# Patient Record
Sex: Female | Born: 1940 | Race: White | Hispanic: No | Smoking: Never smoker
Health system: Southern US, Community
[De-identification: ages and names within clinical notes are randomized; demographics above are authoritative.]

## PROBLEM LIST (undated history)

## (undated) DIAGNOSIS — I82409 Acute embolism and thrombosis of unspecified deep veins of unspecified lower extremity: Secondary | ICD-10-CM

## (undated) DIAGNOSIS — E785 Hyperlipidemia, unspecified: Secondary | ICD-10-CM

## (undated) DIAGNOSIS — J189 Pneumonia, unspecified organism: Secondary | ICD-10-CM

## (undated) DIAGNOSIS — Z9981 Dependence on supplemental oxygen: Secondary | ICD-10-CM

## (undated) DIAGNOSIS — K449 Diaphragmatic hernia without obstruction or gangrene: Secondary | ICD-10-CM

## (undated) DIAGNOSIS — Z9889 Other specified postprocedural states: Secondary | ICD-10-CM

## (undated) DIAGNOSIS — R059 Cough, unspecified: Secondary | ICD-10-CM

## (undated) DIAGNOSIS — R55 Syncope and collapse: Secondary | ICD-10-CM

## (undated) DIAGNOSIS — I2510 Atherosclerotic heart disease of native coronary artery without angina pectoris: Secondary | ICD-10-CM

## (undated) DIAGNOSIS — F0390 Unspecified dementia without behavioral disturbance: Secondary | ICD-10-CM

## (undated) DIAGNOSIS — R519 Headache, unspecified: Secondary | ICD-10-CM

## (undated) DIAGNOSIS — R05 Cough: Secondary | ICD-10-CM

## (undated) DIAGNOSIS — R413 Other amnesia: Secondary | ICD-10-CM

## (undated) DIAGNOSIS — R51 Headache: Secondary | ICD-10-CM

## (undated) DIAGNOSIS — J45909 Unspecified asthma, uncomplicated: Secondary | ICD-10-CM

## (undated) DIAGNOSIS — Z8719 Personal history of other diseases of the digestive system: Secondary | ICD-10-CM

## (undated) DIAGNOSIS — R112 Nausea with vomiting, unspecified: Secondary | ICD-10-CM

## (undated) DIAGNOSIS — H8110 Benign paroxysmal vertigo, unspecified ear: Principal | ICD-10-CM

## (undated) DIAGNOSIS — Z7901 Long term (current) use of anticoagulants: Secondary | ICD-10-CM

## (undated) DIAGNOSIS — D126 Benign neoplasm of colon, unspecified: Secondary | ICD-10-CM

## (undated) DIAGNOSIS — IMO0001 Reserved for inherently not codable concepts without codable children: Secondary | ICD-10-CM

## (undated) DIAGNOSIS — I2699 Other pulmonary embolism without acute cor pulmonale: Secondary | ICD-10-CM

## (undated) DIAGNOSIS — F419 Anxiety disorder, unspecified: Secondary | ICD-10-CM

## (undated) DIAGNOSIS — K5730 Diverticulosis of large intestine without perforation or abscess without bleeding: Secondary | ICD-10-CM

## (undated) DIAGNOSIS — R918 Other nonspecific abnormal finding of lung field: Secondary | ICD-10-CM

## (undated) DIAGNOSIS — J449 Chronic obstructive pulmonary disease, unspecified: Secondary | ICD-10-CM

## (undated) DIAGNOSIS — F32A Depression, unspecified: Secondary | ICD-10-CM

## (undated) DIAGNOSIS — I6529 Occlusion and stenosis of unspecified carotid artery: Secondary | ICD-10-CM

## (undated) DIAGNOSIS — D0359 Melanoma in situ of other part of trunk: Secondary | ICD-10-CM

## (undated) DIAGNOSIS — M1990 Unspecified osteoarthritis, unspecified site: Secondary | ICD-10-CM

## (undated) DIAGNOSIS — R296 Repeated falls: Secondary | ICD-10-CM

## (undated) DIAGNOSIS — K219 Gastro-esophageal reflux disease without esophagitis: Secondary | ICD-10-CM

## (undated) DIAGNOSIS — F329 Major depressive disorder, single episode, unspecified: Secondary | ICD-10-CM

## (undated) HISTORY — DX: Unspecified dementia, unspecified severity, without behavioral disturbance, psychotic disturbance, mood disturbance, and anxiety: F03.90

## (undated) HISTORY — DX: Other amnesia: R41.3

## (undated) HISTORY — DX: Dependence on supplemental oxygen: Z99.81

## (undated) HISTORY — DX: Atherosclerotic heart disease of native coronary artery without angina pectoris: I25.10

## (undated) HISTORY — PX: BREAST CYST EXCISION: SHX579

## (undated) HISTORY — DX: Diaphragmatic hernia without obstruction or gangrene: K44.9

## (undated) HISTORY — DX: Unspecified asthma, uncomplicated: J45.909

## (undated) HISTORY — PX: APPENDECTOMY: SHX54

## (undated) HISTORY — DX: Benign neoplasm of colon, unspecified: D12.6

## (undated) HISTORY — DX: Syncope and collapse: R55

## (undated) HISTORY — DX: Gastro-esophageal reflux disease without esophagitis: K21.9

## (undated) HISTORY — PX: CARPAL TUNNEL RELEASE: SHX101

## (undated) HISTORY — DX: Cough: R05

## (undated) HISTORY — DX: Diverticulosis of large intestine without perforation or abscess without bleeding: K57.30

## (undated) HISTORY — DX: Hyperlipidemia, unspecified: E78.5

## (undated) HISTORY — PX: MELANOMA EXCISION: SHX5266

## (undated) HISTORY — DX: Major depressive disorder, single episode, unspecified: F32.9

## (undated) HISTORY — DX: Cough, unspecified: R05.9

## (undated) HISTORY — DX: Other specified postprocedural states: Z98.890

## (undated) HISTORY — PX: ANKLE RECONSTRUCTION: SHX1151

## (undated) HISTORY — DX: Occlusion and stenosis of unspecified carotid artery: I65.29

## (undated) HISTORY — PX: TUBAL LIGATION: SHX77

## (undated) HISTORY — DX: Depression, unspecified: F32.A

## (undated) HISTORY — DX: Benign paroxysmal vertigo, unspecified ear: H81.10

## (undated) HISTORY — DX: Pneumonia, unspecified organism: J18.9

## (undated) HISTORY — DX: Other nonspecific abnormal finding of lung field: R91.8

## (undated) HISTORY — DX: Anxiety disorder, unspecified: F41.9

## (undated) HISTORY — DX: Unspecified osteoarthritis, unspecified site: M19.90

## (undated) HISTORY — DX: Personal history of other diseases of the digestive system: Z87.19

---

## 1981-06-20 DIAGNOSIS — Z9889 Other specified postprocedural states: Secondary | ICD-10-CM

## 1981-06-20 DIAGNOSIS — R112 Nausea with vomiting, unspecified: Secondary | ICD-10-CM

## 1981-06-20 HISTORY — DX: Other specified postprocedural states: Z98.890

## 1981-06-20 HISTORY — DX: Nausea with vomiting, unspecified: R11.2

## 1997-11-19 ENCOUNTER — Ambulatory Visit (HOSPITAL_COMMUNITY): Admission: RE | Admit: 1997-11-19 | Discharge: 1997-11-19 | Payer: Self-pay | Admitting: Cardiovascular Disease

## 2000-09-04 ENCOUNTER — Other Ambulatory Visit: Admission: RE | Admit: 2000-09-04 | Discharge: 2000-09-04 | Payer: Self-pay | Admitting: Internal Medicine

## 2001-12-24 ENCOUNTER — Ambulatory Visit (HOSPITAL_COMMUNITY): Admission: RE | Admit: 2001-12-24 | Discharge: 2001-12-24 | Payer: Self-pay | Admitting: Gastroenterology

## 2002-01-16 ENCOUNTER — Encounter: Admission: RE | Admit: 2002-01-16 | Discharge: 2002-01-16 | Payer: Self-pay | Admitting: Family Medicine

## 2002-01-16 ENCOUNTER — Encounter: Payer: Self-pay | Admitting: Family Medicine

## 2002-04-14 ENCOUNTER — Emergency Department (HOSPITAL_COMMUNITY): Admission: EM | Admit: 2002-04-14 | Discharge: 2002-04-14 | Payer: Self-pay | Admitting: Emergency Medicine

## 2002-04-14 ENCOUNTER — Encounter: Payer: Self-pay | Admitting: Emergency Medicine

## 2003-04-07 ENCOUNTER — Encounter: Admission: RE | Admit: 2003-04-07 | Discharge: 2003-04-07 | Payer: Self-pay | Admitting: Family Medicine

## 2003-04-07 ENCOUNTER — Encounter: Payer: Self-pay | Admitting: Family Medicine

## 2003-04-27 ENCOUNTER — Emergency Department (HOSPITAL_COMMUNITY): Admission: EM | Admit: 2003-04-27 | Discharge: 2003-04-28 | Payer: Self-pay | Admitting: Emergency Medicine

## 2003-05-08 ENCOUNTER — Ambulatory Visit (HOSPITAL_COMMUNITY): Admission: RE | Admit: 2003-05-08 | Discharge: 2003-05-08 | Payer: Self-pay | Admitting: Internal Medicine

## 2003-05-20 ENCOUNTER — Inpatient Hospital Stay (HOSPITAL_COMMUNITY): Admission: AD | Admit: 2003-05-20 | Discharge: 2003-05-26 | Payer: Self-pay | Admitting: Internal Medicine

## 2003-05-23 ENCOUNTER — Encounter (INDEPENDENT_AMBULATORY_CARE_PROVIDER_SITE_OTHER): Payer: Self-pay | Admitting: *Deleted

## 2003-12-16 ENCOUNTER — Encounter: Payer: Self-pay | Admitting: Internal Medicine

## 2003-12-18 ENCOUNTER — Ambulatory Visit: Admission: RE | Admit: 2003-12-18 | Discharge: 2003-12-18 | Payer: Self-pay | Admitting: Orthopedic Surgery

## 2004-10-06 ENCOUNTER — Other Ambulatory Visit: Admission: RE | Admit: 2004-10-06 | Discharge: 2004-10-06 | Payer: Self-pay | Admitting: Family Medicine

## 2006-03-21 ENCOUNTER — Other Ambulatory Visit: Admission: RE | Admit: 2006-03-21 | Discharge: 2006-03-21 | Payer: Self-pay | Admitting: Family Medicine

## 2006-06-23 ENCOUNTER — Encounter: Admission: RE | Admit: 2006-06-23 | Discharge: 2006-06-23 | Payer: Self-pay | Admitting: Family Medicine

## 2006-07-18 ENCOUNTER — Encounter: Admission: RE | Admit: 2006-07-18 | Discharge: 2006-07-18 | Payer: Self-pay | Admitting: Family Medicine

## 2006-10-27 ENCOUNTER — Encounter: Admission: RE | Admit: 2006-10-27 | Discharge: 2006-10-27 | Payer: Self-pay | Admitting: Family Medicine

## 2006-10-27 ENCOUNTER — Encounter (INDEPENDENT_AMBULATORY_CARE_PROVIDER_SITE_OTHER): Payer: Self-pay | Admitting: *Deleted

## 2006-11-03 ENCOUNTER — Encounter: Admission: RE | Admit: 2006-11-03 | Discharge: 2006-11-03 | Payer: Self-pay | Admitting: Family Medicine

## 2006-11-14 ENCOUNTER — Ambulatory Visit (HOSPITAL_COMMUNITY): Admission: RE | Admit: 2006-11-14 | Discharge: 2006-11-14 | Payer: Self-pay | Admitting: Family Medicine

## 2007-05-04 ENCOUNTER — Encounter: Admission: RE | Admit: 2007-05-04 | Discharge: 2007-05-04 | Payer: Self-pay | Admitting: Family Medicine

## 2007-05-23 ENCOUNTER — Encounter: Admission: RE | Admit: 2007-05-23 | Discharge: 2007-05-23 | Payer: Self-pay | Admitting: Orthopedic Surgery

## 2007-08-02 ENCOUNTER — Ambulatory Visit: Payer: Self-pay | Admitting: Internal Medicine

## 2007-08-02 DIAGNOSIS — R0609 Other forms of dyspnea: Secondary | ICD-10-CM | POA: Insufficient documentation

## 2007-08-02 DIAGNOSIS — R05 Cough: Secondary | ICD-10-CM

## 2007-08-02 DIAGNOSIS — R0989 Other specified symptoms and signs involving the circulatory and respiratory systems: Secondary | ICD-10-CM

## 2007-08-03 LAB — CONVERTED CEMR LAB
BUN: 14 mg/dL (ref 6–23)
Basophils Absolute: 0 10*3/uL (ref 0.0–0.1)
CO2: 33 meq/L — ABNORMAL HIGH (ref 19–32)
Calcium: 9.6 mg/dL (ref 8.4–10.5)
Creatinine, Ser: 0.7 mg/dL (ref 0.4–1.2)
GFR calc Af Amer: 108 mL/min
Glucose, Bld: 100 mg/dL — ABNORMAL HIGH (ref 70–99)
HCT: 41 % (ref 36.0–46.0)
Hemoglobin: 13.6 g/dL (ref 12.0–15.0)
Lymphocytes Relative: 43.9 % (ref 12.0–46.0)
Neutro Abs: 3.5 10*3/uL (ref 1.4–7.7)
Neutrophils Relative %: 43.8 % (ref 43.0–77.0)
Potassium: 4.3 meq/L (ref 3.5–5.1)
Pro B Natriuretic peptide (BNP): 36 pg/mL (ref 0.0–100.0)
RDW: 12.3 % (ref 11.5–14.6)

## 2007-09-27 ENCOUNTER — Encounter: Admission: RE | Admit: 2007-09-27 | Discharge: 2007-09-27 | Payer: Self-pay | Admitting: Family Medicine

## 2007-10-03 ENCOUNTER — Ambulatory Visit: Payer: Self-pay | Admitting: Internal Medicine

## 2007-10-19 ENCOUNTER — Ambulatory Visit: Payer: Self-pay | Admitting: Internal Medicine

## 2007-11-02 ENCOUNTER — Ambulatory Visit: Payer: Self-pay | Admitting: Internal Medicine

## 2007-11-20 ENCOUNTER — Ambulatory Visit: Payer: Self-pay | Admitting: Internal Medicine

## 2007-12-11 ENCOUNTER — Telehealth (INDEPENDENT_AMBULATORY_CARE_PROVIDER_SITE_OTHER): Payer: Self-pay | Admitting: *Deleted

## 2007-12-11 ENCOUNTER — Encounter: Payer: Self-pay | Admitting: Internal Medicine

## 2007-12-12 ENCOUNTER — Telehealth (INDEPENDENT_AMBULATORY_CARE_PROVIDER_SITE_OTHER): Payer: Self-pay | Admitting: *Deleted

## 2008-01-01 ENCOUNTER — Ambulatory Visit: Payer: Self-pay | Admitting: Internal Medicine

## 2008-01-01 DIAGNOSIS — J45909 Unspecified asthma, uncomplicated: Secondary | ICD-10-CM | POA: Insufficient documentation

## 2008-01-29 ENCOUNTER — Ambulatory Visit: Payer: Self-pay | Admitting: Internal Medicine

## 2008-03-11 ENCOUNTER — Ambulatory Visit: Payer: Self-pay | Admitting: Internal Medicine

## 2008-04-30 ENCOUNTER — Ambulatory Visit: Payer: Self-pay | Admitting: Internal Medicine

## 2008-04-30 DIAGNOSIS — J449 Chronic obstructive pulmonary disease, unspecified: Secondary | ICD-10-CM

## 2008-04-30 DIAGNOSIS — K219 Gastro-esophageal reflux disease without esophagitis: Secondary | ICD-10-CM

## 2008-04-30 DIAGNOSIS — J4489 Other specified chronic obstructive pulmonary disease: Secondary | ICD-10-CM | POA: Insufficient documentation

## 2008-05-22 ENCOUNTER — Ambulatory Visit: Payer: Self-pay | Admitting: Internal Medicine

## 2008-05-26 ENCOUNTER — Ambulatory Visit: Payer: Self-pay | Admitting: Gastroenterology

## 2008-05-26 DIAGNOSIS — R1310 Dysphagia, unspecified: Secondary | ICD-10-CM | POA: Insufficient documentation

## 2008-06-11 ENCOUNTER — Ambulatory Visit: Payer: Self-pay | Admitting: Gastroenterology

## 2008-06-11 DIAGNOSIS — K449 Diaphragmatic hernia without obstruction or gangrene: Secondary | ICD-10-CM

## 2008-06-11 HISTORY — DX: Diaphragmatic hernia without obstruction or gangrene: K44.9

## 2008-06-11 HISTORY — PX: ESOPHAGOGASTRODUODENOSCOPY: SHX1529

## 2008-07-14 ENCOUNTER — Encounter: Payer: Self-pay | Admitting: Gastroenterology

## 2008-07-14 ENCOUNTER — Telehealth: Payer: Self-pay | Admitting: Gastroenterology

## 2008-07-15 ENCOUNTER — Encounter: Payer: Self-pay | Admitting: Gastroenterology

## 2008-08-01 DIAGNOSIS — K449 Diaphragmatic hernia without obstruction or gangrene: Secondary | ICD-10-CM | POA: Insufficient documentation

## 2008-08-06 ENCOUNTER — Ambulatory Visit: Payer: Self-pay | Admitting: Internal Medicine

## 2008-08-06 ENCOUNTER — Telehealth: Payer: Self-pay | Admitting: Gastroenterology

## 2008-08-06 ENCOUNTER — Ambulatory Visit: Payer: Self-pay | Admitting: Gastroenterology

## 2008-08-08 ENCOUNTER — Telehealth: Payer: Self-pay | Admitting: Gastroenterology

## 2008-08-08 DIAGNOSIS — R1032 Left lower quadrant pain: Secondary | ICD-10-CM

## 2008-08-08 DIAGNOSIS — K5730 Diverticulosis of large intestine without perforation or abscess without bleeding: Secondary | ICD-10-CM | POA: Insufficient documentation

## 2008-08-08 DIAGNOSIS — R1031 Right lower quadrant pain: Secondary | ICD-10-CM

## 2008-08-11 ENCOUNTER — Ambulatory Visit: Payer: Self-pay | Admitting: Cardiology

## 2008-08-11 ENCOUNTER — Ambulatory Visit: Payer: Self-pay | Admitting: Gastroenterology

## 2008-08-11 LAB — CONVERTED CEMR LAB: Creatinine, Ser: 0.9 mg/dL (ref 0.4–1.2)

## 2008-08-12 ENCOUNTER — Telehealth: Payer: Self-pay | Admitting: Gastroenterology

## 2008-08-13 ENCOUNTER — Telehealth: Payer: Self-pay | Admitting: Gastroenterology

## 2008-08-13 ENCOUNTER — Ambulatory Visit (HOSPITAL_COMMUNITY): Admission: RE | Admit: 2008-08-13 | Discharge: 2008-08-13 | Payer: Self-pay | Admitting: Gastroenterology

## 2008-08-15 ENCOUNTER — Ambulatory Visit: Payer: Self-pay | Admitting: Gastroenterology

## 2008-08-15 DIAGNOSIS — K3184 Gastroparesis: Secondary | ICD-10-CM

## 2008-08-22 ENCOUNTER — Telehealth: Payer: Self-pay | Admitting: Gastroenterology

## 2008-08-25 ENCOUNTER — Telehealth: Payer: Self-pay | Admitting: Gastroenterology

## 2008-09-08 ENCOUNTER — Ambulatory Visit: Payer: Self-pay | Admitting: Gastroenterology

## 2008-09-09 ENCOUNTER — Ambulatory Visit: Payer: Self-pay | Admitting: Gastroenterology

## 2008-09-09 DIAGNOSIS — K5730 Diverticulosis of large intestine without perforation or abscess without bleeding: Secondary | ICD-10-CM

## 2008-09-09 HISTORY — PX: COLONOSCOPY: SHX174

## 2008-09-09 HISTORY — DX: Diverticulosis of large intestine without perforation or abscess without bleeding: K57.30

## 2008-09-17 ENCOUNTER — Telehealth: Payer: Self-pay | Admitting: Gastroenterology

## 2008-09-22 ENCOUNTER — Ambulatory Visit: Payer: Self-pay | Admitting: Internal Medicine

## 2008-11-03 ENCOUNTER — Ambulatory Visit: Payer: Self-pay | Admitting: Internal Medicine

## 2008-11-03 DIAGNOSIS — J209 Acute bronchitis, unspecified: Secondary | ICD-10-CM | POA: Insufficient documentation

## 2008-11-05 ENCOUNTER — Other Ambulatory Visit: Admission: RE | Admit: 2008-11-05 | Discharge: 2008-11-05 | Payer: Self-pay | Admitting: Family Medicine

## 2008-11-07 ENCOUNTER — Telehealth (INDEPENDENT_AMBULATORY_CARE_PROVIDER_SITE_OTHER): Payer: Self-pay | Admitting: *Deleted

## 2008-11-14 ENCOUNTER — Encounter: Admission: RE | Admit: 2008-11-14 | Discharge: 2008-11-14 | Payer: Self-pay | Admitting: Family Medicine

## 2008-12-25 ENCOUNTER — Telehealth: Payer: Self-pay | Admitting: Gastroenterology

## 2008-12-26 ENCOUNTER — Ambulatory Visit: Payer: Self-pay | Admitting: Internal Medicine

## 2009-01-09 ENCOUNTER — Telehealth: Payer: Self-pay | Admitting: Gastroenterology

## 2009-01-12 ENCOUNTER — Ambulatory Visit: Payer: Self-pay | Admitting: Internal Medicine

## 2009-01-15 ENCOUNTER — Telehealth: Payer: Self-pay | Admitting: Gastroenterology

## 2009-01-16 ENCOUNTER — Telehealth: Payer: Self-pay | Admitting: Gastroenterology

## 2009-02-18 ENCOUNTER — Ambulatory Visit: Payer: Self-pay | Admitting: Internal Medicine

## 2009-03-13 ENCOUNTER — Ambulatory Visit: Payer: Self-pay | Admitting: Internal Medicine

## 2009-03-16 ENCOUNTER — Telehealth (INDEPENDENT_AMBULATORY_CARE_PROVIDER_SITE_OTHER): Payer: Self-pay | Admitting: *Deleted

## 2009-03-17 ENCOUNTER — Ambulatory Visit: Payer: Self-pay | Admitting: Cardiology

## 2009-04-02 ENCOUNTER — Ambulatory Visit: Payer: Self-pay | Admitting: Internal Medicine

## 2009-04-02 DIAGNOSIS — J310 Chronic rhinitis: Secondary | ICD-10-CM

## 2009-04-08 ENCOUNTER — Telehealth: Payer: Self-pay | Admitting: Internal Medicine

## 2009-05-11 ENCOUNTER — Ambulatory Visit: Payer: Self-pay | Admitting: Internal Medicine

## 2009-07-03 ENCOUNTER — Telehealth: Payer: Self-pay | Admitting: Gastroenterology

## 2009-07-13 ENCOUNTER — Ambulatory Visit: Payer: Self-pay | Admitting: Internal Medicine

## 2009-08-13 ENCOUNTER — Ambulatory Visit: Payer: Self-pay | Admitting: Internal Medicine

## 2009-09-07 ENCOUNTER — Ambulatory Visit: Payer: Self-pay | Admitting: Internal Medicine

## 2009-10-19 ENCOUNTER — Ambulatory Visit: Payer: Self-pay | Admitting: Internal Medicine

## 2009-11-11 ENCOUNTER — Telehealth (INDEPENDENT_AMBULATORY_CARE_PROVIDER_SITE_OTHER): Payer: Self-pay | Admitting: *Deleted

## 2009-11-23 ENCOUNTER — Telehealth: Payer: Self-pay | Admitting: Internal Medicine

## 2009-12-01 ENCOUNTER — Telehealth (INDEPENDENT_AMBULATORY_CARE_PROVIDER_SITE_OTHER): Payer: Self-pay | Admitting: *Deleted

## 2009-12-01 ENCOUNTER — Ambulatory Visit: Payer: Self-pay | Admitting: Internal Medicine

## 2009-12-03 ENCOUNTER — Encounter: Payer: Self-pay | Admitting: Internal Medicine

## 2009-12-03 DIAGNOSIS — B370 Candidal stomatitis: Secondary | ICD-10-CM

## 2009-12-09 ENCOUNTER — Encounter: Payer: Self-pay | Admitting: Internal Medicine

## 2010-01-08 ENCOUNTER — Ambulatory Visit: Payer: Self-pay | Admitting: Internal Medicine

## 2010-01-11 ENCOUNTER — Telehealth: Payer: Self-pay | Admitting: Internal Medicine

## 2010-01-15 ENCOUNTER — Telehealth (INDEPENDENT_AMBULATORY_CARE_PROVIDER_SITE_OTHER): Payer: Self-pay | Admitting: *Deleted

## 2010-01-20 ENCOUNTER — Encounter: Payer: Self-pay | Admitting: Internal Medicine

## 2010-01-21 ENCOUNTER — Ambulatory Visit: Payer: Self-pay | Admitting: Internal Medicine

## 2010-02-08 ENCOUNTER — Telehealth (INDEPENDENT_AMBULATORY_CARE_PROVIDER_SITE_OTHER): Payer: Self-pay | Admitting: *Deleted

## 2010-02-24 ENCOUNTER — Ambulatory Visit: Payer: Self-pay | Admitting: Internal Medicine

## 2010-03-10 ENCOUNTER — Telehealth: Payer: Self-pay | Admitting: Internal Medicine

## 2010-04-05 ENCOUNTER — Ambulatory Visit: Payer: Self-pay | Admitting: Internal Medicine

## 2010-04-26 ENCOUNTER — Ambulatory Visit: Payer: Self-pay | Admitting: Internal Medicine

## 2010-04-27 ENCOUNTER — Telehealth: Payer: Self-pay | Admitting: Gastroenterology

## 2010-05-24 ENCOUNTER — Ambulatory Visit: Payer: Self-pay | Admitting: Internal Medicine

## 2010-05-24 ENCOUNTER — Encounter: Payer: Self-pay | Admitting: Adult Health

## 2010-06-15 ENCOUNTER — Encounter: Payer: Self-pay | Admitting: Internal Medicine

## 2010-06-20 HISTORY — PX: ROTATOR CUFF REPAIR: SHX139

## 2010-06-22 ENCOUNTER — Encounter: Payer: Self-pay | Admitting: Internal Medicine

## 2010-06-23 ENCOUNTER — Telehealth: Payer: Self-pay | Admitting: Internal Medicine

## 2010-06-24 ENCOUNTER — Ambulatory Visit
Admission: RE | Admit: 2010-06-24 | Discharge: 2010-06-24 | Payer: Self-pay | Source: Home / Self Care | Attending: Internal Medicine | Admitting: Internal Medicine

## 2010-07-10 ENCOUNTER — Emergency Department (HOSPITAL_COMMUNITY)
Admission: EM | Admit: 2010-07-10 | Discharge: 2010-07-10 | Payer: Self-pay | Source: Home / Self Care | Admitting: Emergency Medicine

## 2010-07-10 DIAGNOSIS — I82409 Acute embolism and thrombosis of unspecified deep veins of unspecified lower extremity: Secondary | ICD-10-CM | POA: Insufficient documentation

## 2010-07-11 ENCOUNTER — Ambulatory Visit (HOSPITAL_COMMUNITY)
Admission: RE | Admit: 2010-07-11 | Discharge: 2010-07-11 | Payer: Self-pay | Source: Home / Self Care | Attending: Emergency Medicine | Admitting: Emergency Medicine

## 2010-07-19 ENCOUNTER — Encounter (HOSPITAL_COMMUNITY)
Admission: RE | Admit: 2010-07-19 | Discharge: 2010-07-20 | Payer: Self-pay | Source: Home / Self Care | Attending: Orthopedic Surgery | Admitting: Orthopedic Surgery

## 2010-07-20 ENCOUNTER — Ambulatory Visit
Admission: RE | Admit: 2010-07-20 | Discharge: 2010-07-20 | Payer: Self-pay | Source: Home / Self Care | Attending: Vascular Surgery | Admitting: Vascular Surgery

## 2010-07-20 NOTE — Progress Notes (Signed)
Summary: speak to nurse  Phone Note Call from Patient Call back at Home Phone [phone]   Caller: Patient Call For: wert Reason for Call: Talk to Nurse Summary of Call: Pt c/o ot thrush in her mouth, wants something called in.//[facility] Initial call taken by: Darletta Moll,  February 08, 2010 1:17 PM  Follow-up for Phone Call        Spoke with pt.  She c/o white film on tounge and sore mouth.  She states that her lips feel raw.  Would like med called in for trush.  Pls advise thanks! Follow-up by: Vernie Murders,  February 08, 2010 1:22 PM  Additional Follow-up for Phone Call Additional follow up Details #1::        mycelex troche  1 troche 5x day x 7 days. no refills  rec eat yogurt two times a day  Please contact office for sooner follow up if symptoms do not improve or worsen  Additional Follow-up by: Rubye Oaks NP,  February 08, 2010 2:06 PM    Additional Follow-up for Phone Call Additional follow up Details #2::    Rx was sent to pharm.  Spoke with pt and made aware of this and to eat yogurt two times a day.  Pt verbalized understanding. Follow-up by: Vernie Murders,  February 08, 2010 2:27 PM  New/Updated Medications: MYCELEX 10 MG TROC (CLOTRIMAZOLE) 1 five times per day x 5 days Prescriptions: MYCELEX 10 MG TROC (CLOTRIMAZOLE) 1 five times per day x 5 days  #35 x 0   Entered by:   Vernie Murders   Authorized by:   Rubye Oaks NP   Signed by:   Vernie Murders on 02/08/2010   Method used:   Electronically to        [facility] (retail)       [address]       [place], [place]  [address]       Ph: [phone] or [phone]       Fax: [phone]   RxID:   [id number]   Appended Document: speak to nurse pharmacy called back and stated that the #35 was for a 7 day supply and not a 5 day supply---the 7 day supply was correct and ok per me for [facility] to let the pharmacy know that is should be a 7 day supply.  rx was  corrected at pharmacy

## 2010-07-20 NOTE — Assessment & Plan Note (Signed)
Summary: duplicate OV note - JJ   Allergies: 1)  ! Asa 2)  ! Codeine 3)  ! Sulfa 4)  ! Morphine 5)  ! Pcn 6)  ! Reglan (Metoclopramide Hcl)   Other Orders: No Charge Patient Arrived (NCPA0) (NCPA0)

## 2010-07-20 NOTE — Assessment & Plan Note (Signed)
Summary: Pulmonary/ f/u ov with HFA @ 100% after coaching   Primary Provider/Referring Provider:  Maurice Small, MD  CC:  6 wk followup.  Pt denies any complaints today.  States that her breathing has been okay.  She denies any complaints today.[name]  History of Present Illness:  70  yowf never smoker c/o  cough since 2000  day > night, dry > wet  and exacerbated during times when she has URI.  Former workup  included allergy testing that showed only mild positivity and no response immunotherapy  1990s by Dr. Corinda Gubler,  negative FOB  in 2004, and only controlled as inpt with outpt nonadherence documented repeatedly.  08/02/07 c/o 3 months worsening dyspnea  after exac of cough stopped her from exercising.   when asked  if anything it ever help the cough she said no.  In fact the record indicates complete resolution during hospitalization 2004.  Seen 12/11/07  50% improvement, given prednisone taper and started on Symbicort 80/4.55mcg 2 pufs two times a day.    01/01/08 cough completely resolved but still doe x anything more than slow adl's.  overall though admits these she is the best she's been in years  . Increased symbicort to 160/4.5 2 two times a day   01/29/08--ov  added zyrtec at hs as needed for pnds pos response  March 11, 2008 increased sob but only used neb 3 x in last month, no noct co's, no overt hb. Rec try off reglan to reduce risk of longterm side effects-  April 30, 2008 ov :  worse x 2 weeks with increase dyspnea > cough, minimally productive. 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 C/o  1 week of productive cough, that is progressively worsening over last few days. Did not bring meds as requested in writing.   December 26, 2008  confused with meds again, not using prns aggressively. thinks nerves are the issue. cough  same as always, dry daytime barking quality.  --Rx-steroid taper and restarted on reglan  January 12, 2009  ov:  med review. Improved from last visit. Cough is much better. Is taking reglan four times a day , using cough meds as needed. Less use of combivent.  May 11, 2009 ov rhinitis  no better with astepro, much better with atrovent.  Cough resolved. Still doe but not using rescue appropriately.  July 13, 2009 Followup with PFT's.  Pt c/o increased SOB with exertion x 2-3 wks.  She also c/o still having a dry cough.  She has noticed some wheezing when she goes outside.  rec change  to dulera  200 2 puffs first thing  in am and 2 puffs again in pm about 12 hours later   August 13, 2009 Acute visit.  pt c/o cough x 1 wk- prod with white sputum.  She also c/o increased SOB and wheezing.  Was seen by physician in [place] and given zpack and depomedrol.  using med calendar more consistently now to self manage.     September 07, 2009-- med review. last visit w/ flare tx w/ steroids. IMproved back to baseline.  rec d/c reglan due to shaking  Oct 19, 2009 6  wk followup.  Pt denies any complaints today.  States that her breathing has been okay.  No cough. Pt denies any significant sore throat, dysphagia, itching, sneezing,  nasal congestion or excess secretions,  fever, chills, sweats, unintended wt loss, pleuritic or exertional cp, hempoptysis, change in activity tolerance  orthopnea pnd or leg swelling.  Current Medications (verified): 1)  Multivitamins   Tabs (Multiple Vitamin) .... Take 1 Tablet By Mouth Once A Day 2)  Welchol 625 Mg  Tabs (Colesevelam Hcl) .... Take 2 Tablets By Mouth Three Times A Day 3)  Dulera 200-5 Mcg/act Aero (Mometasone Furo-Formoterol Fum) .... 2 Puffs First Thing  in Am and 2 Puffs Again in Pm About 12 Hours Later 4)  Torsemide 20 Mg  Tabs (Torsemide) .... Take 1 Tablet By Mouth Once A  Day 5)  Ery-Tab 250 Mg Tbec (Erythromycin Base) .... Take One Tablet 30 Minutes Before Breakfast,lunch,dinner and At Bedtime. 6)  Zegerid Otc 20-1100 Mg Caps (Omeprazole-Sodium Bicarbonate) .... Take 1 Capsule By Mouth Two Times A Day 7)  Crestor 10 Mg Tabs (Rosuvastatin Calcium) .... Take 1 Tablet By Mouth Once A Day 8)  Miralax  Powd (Polyethylene Glycol 3350) .[name].. 1 Capful in Water Daily 9)  Stool Softener 100 Mg Caps (Docusate Sodium) .... Take One Capsule At Bedtime 10)  Mucinex Dm 30-600 Mg  Tb12 (Dextromethorphan-Guaifenesin) .[name].. 1-2 By Mouth Two Times A Day As Needed 11)  Tessalon 200 Mg  Caps (Benzonatate) .[name].. 1 Every 8 Hours As Needed 12)  Tramadol Hcl 50 Mg Tabs (Tramadol Hcl) .[name].. 1 Every 4 Hours As Needed 13)  Meclizine Hcl 25 Mg Tabs (Meclizine Hcl) .[name].. 1 Tablet By Mouth Every 8 Hours As Needed 14)  Duoneb 0.5-2.5 (3) Mg/15ml  Soln (Ipratropium-Albuterol) .... Inhale 1 Neb Via Hhn Every 4-6 Hours As Needed 15)  Lorazepam 1 Mg Tabs (Lorazepam) .[name].. 1 Two Times A Day As Needed 16)  Chlor-Trimeton 4 Mg Tabs (Chlorpheniramine Maleate) .... Take One Tablet By Mouth Every 6 Hours As Needed 17)  Hyomax-Sl 0.125 Mg Subl (Hyoscyamine Sulfate) .... 2 Tabs Sublingual Every 4 Hours As Needed For Pain  Allergies (verified): 1)  ! Asa 2)  ! Codeine 3)  ! Sulfa 4)  ! Morphine 5)  ! Pcn 6)  ! Reglan (Metoclopramide Hcl)  Past History:  Past Medical History: COUGH (ICD-786.2) onset about 16    - Flared off Reglan April 30, 2008     - Changed Reglan > erythromcyin per Arlyce Dice 08/2008    - Add back reglan December 26, 2008     - Sinus ct 03/17/09 nl  Hyperlipidemia Anxiety Chronic Asthma, severe..............................................................................Marland KitchenWert   - FEV1 29%, ratio 43% with 16% improvement after bronchodilators 11/02/2007   - FEV1 33%, ratio 47,  with 14% improvment after B2 July 13, 2009 with DLC0 59> 125% corrected    - [facility] July 13, 2009 50 %>   100% August 13, 2009 > verified Oct 19, 2009   Complex med regimen-  med calendar updated May 22, 2008 , .January 12, 2009  Vital Signs:  Patient profile:   70 year old female Weight:      182 pounds O2 Sat:      97 % on Room air Temp:     97.7 degrees F oral Pulse rate:   77 / minute BP sitting:   128 / 80  (left arm)  Vitals Entered By: Vernie Murders (Oct 19, 2009 10:51 AM)  O2 Flow:  Room air  Physical Exam  Additional Exam:  wt  193 September 22, 2008 >>191 Nov 03, 2008 > 188 January 12, 2009 > 191 March 13, 2009 > 189 April 02, 2009 > 187  May 11, 2009 > 187 July 13, 2009  > 183 August 13, 2009 >>183 September 07, 2009 > 182 Oct 19, 2009  HEENT mild turbinate edema.  Oropharynx no thrush or excess pnd or cobblestoning.  No JVD or cervical adenopathy. Mild accessory muscle hypertrophy. Trachea midline, nl thryroid. Chest  decreased in bases, no wheezing.  Regular rate and rhythm without murmur gallop or rub or increase P2 or edema.  Abd: no hsm, nl excursion. Ext warm without cyanosis or clubbing.     Impression & Recommendations:  Problem # 1:  INTRINSIC ASTHMA, UNSPECIFIED (ICD-493.10) All goals of asthma met including optimal function and elimination of symptoms with minimum need for rescue therapy. Contingencies discussed today including the rule of two's.   I spent extra time with the patient today explaining optimal mdi  technique.  This improved from  75 -100%  Each maintenance medication was reviewed in detail including most importantly the difference between maintenance prns and under what circumstances the prns are to be used.  In addition, these two groups (for which the patient should keep up with refills) were distinguished from a third group :  meds that are used only short term with the intent to complete a course of therapy and then not refill them.  The med list was then fully reconciled and reorganized to reflect this important distinction. See  instructions for specific recommendations   Other Orders: Est. Patient Level III (08657) HFA Instruction [phone])  Patient Instructions: 1)  See calendar for specific medication instructions and bring it back for each and every office visit for every healthcare provider you see.  Without it,  you may not receive the best quality medical care that we feel you deserve.  2)  Return to office in 3 months, sooner if needed

## 2010-07-20 NOTE — Miscellaneous (Signed)
Summary: Orders Update pft charges  Clinical Lists Changes  Orders: Added new Service order of Carbon Monoxide diffusing w/capacity (94720) - Signed Added new Service order of Lung Volumes (94240) - Signed Added new Service order of Spirometry (Pre & Post) (94060) - Signed 

## 2010-07-20 NOTE — Letter (Signed)
Summary: [facility]  [facility]   Imported By: Lester Luis M. Cintron 01/22/2010 14:11:39  _____________________________________________________________________  External Attachment:    Type:   Image     Comment:   External Document

## 2010-07-20 NOTE — Progress Notes (Signed)
Summary: meds too expensive   Phone Note Call from Patient Call back at Home Phone [phone]   Caller: Patient Call For: tammy p Reason for Call: Talk to Nurse Summary of Call: pt had meds ordered through [facility] from today's visit.  She said the two meds together copay's are ove $100+  per month.  She says she can't afford this and you need to try something else. Initial call taken by: Eugene Gavia,  December 01, 2009 4:52 PM  Follow-up for Phone Call        Pt states that neb meds are too expensive. I spoke to [place] and she staets we can send rx to local pharmacy but it will make pt go into donut hole. I spoke to pt and she is not interested in sending rx to pharmacy, she is requesting to go back on Symbicort inhaler because this was a lot less expensive. Please advise.Carron Curie CMA  December 01, 2009 5:29 PM fine with me to just give samples until next ov  but what I'd like her to do is come see me with all meds in two bags and her med calendar and regroup in terms of longterm med rx  Follow-up by: Nyoka Cowden MD,  December 01, 2009 7:58 PM  Additional Follow-up for Phone Call Additional follow up Details #1::        Dr. Sherene Sires, We do not have samples of budesonide.  Pls clarrify if you were wanting to give pt samples of neb meds or change them back to symbicort until she comes in.  Thanks! use symbicort samples until returns (160/4.5) Additional Follow-up by: Nyoka Cowden MD,  December 02, 2009 9:52 AM    Additional Follow-up for Phone Call Additional follow up Details #2::    ATC pt's home number-no answer and unable to leave message.  WCB.  Gweneth Dimitri RN  December 02, 2009 10:02 AM  Called, spoke with pt.  Pt informed ok per MW to change back to symbicort 160 2 puffs two times a day for now but she needs to come in to discuss this for longterm.  She is requesting to keep july 22 appt - did not want to reschedule to a sooner appt- and stated she already has samples of symbicort.   Informed her if she runs out of samples before scheduled ov to call office back.  Pt also aware to bring all meds and med calander with her to ov.  She verbalized understanding.

## 2010-07-20 NOTE — Progress Notes (Signed)
Summary: nebulizor  Phone Note Call from Patient Call back at [facility] Phone [phone]   Caller: Patient Call For: wert Summary of Call: pt has a question re: taking neb meds- together or separate?  Initial call taken by: Tivis Ringer, CNA,  January 15, 2010 4:36 PM  Follow-up for Phone Call        Spoke with pt and advised okay to mix her brovana and budesonide.  Pt verbalized understanding. Follow-up by: Vernie Murders,  January 15, 2010 4:41 PM

## 2010-07-20 NOTE — Progress Notes (Signed)
Summary: rx   Phone Note Call from Patient Call back at Home Phone [phone] Call back at [phone]   Caller: Patient Call For: wert Reason for Call: Talk to Nurse Summary of Call: pt has a yeast infection in her mouth from inhalers.  She has been rinsing her mouth, but would like something called in.  She has to go out of town tomorrow, family emergency,so would appreciate something being called in today. [facility] Initial call taken by: Eugene Gavia,  Nov 11, 2009 10:22 AM  Follow-up for Phone Call        Pt c/o yeast in her mouth, she sees white spots and her mouth is very sore. She states seh is rinsing with water every time she uses inhalers. Pt is leavign to go out of town and requests rx be called in. Pt advised MW out of office, so I will forward to doc of day. Pelase advise.Carron Curie CMA  Nov 11, 2009 10:47 AM  pt called back.  Wondering if anything had been called in yet.  Concerned as she is leaving in the am.  Follow-up by: Eugene Gavia,  Nov 11, 2009 3:24 PM  Additional Follow-up for Phone Call Additional follow up Details #1::        nystatin suspension 400,000 units swish and swallow three times a day for 5 days,  QS, no fills. Additional Follow-up by: Barbaraann Share MD,  Nov 11, 2009 5:28 PM    Additional Follow-up for Phone Call Additional follow up Details #2::    rx sent to pharmacy of pt's choice.  pt is aware. Boone Master CNA/MA  Nov 11, 2009 5:33 PM   New/Updated Medications: NYSTATIN 100000 UNIT/ML SUSP (NYSTATIN) swish and swallow 4mL by mouth three times a day x5days Prescriptions: NYSTATIN 100000 UNIT/ML SUSP (NYSTATIN) swish and swallow 4mL by mouth three times a day x5days  #4oz x 0   Entered by:   Boone Master CNA/MA   Authorized by:   Barbaraann Share MD   Signed by:   Boone Master CNA/MA on 11/11/2009   Method used:   Electronically to        [facility] (retail)       [address]       [place], [place]  [address]       Ph: [phone] or [phone]       Fax: [phone]   RxID:   [id number]

## 2010-07-20 NOTE — Progress Notes (Signed)
Summary: Ery-Tab refill  Phone Note Refill Request Message from:  Fax from Pharmacy on July 03, 2009 10:11 AM  Refills Requested: Medication #1:  ERY-TAB 250 MG TBEC Take one tablet 30 minutes before breakfast   Dosage confirmed as above?Dosage Confirmed   Brand Name Necessary? No   Supply Requested: 6 months  Method Requested: Electronic Initial call taken by: Merri Ray CMA (AAMA),  July 03, 2009 10:11 AM    New/Updated Medications: ERY-TAB 250 MG TBEC (ERYTHROMYCIN BASE) Take one tablet 30 minutes before breakfast,lunch,dinner and at bedtime. Prescriptions: ERY-TAB 250 MG TBEC (ERYTHROMYCIN BASE) Take one tablet 30 minutes before breakfast,lunch,dinner and at bedtime.  #120 x 6   Entered by:   Merri Ray CMA (AAMA)   Authorized by:   Louis Meckel MD   Signed by:   Merri Ray CMA (AAMA) on 07/03/2009   Method used:   Electronically to        [facility] (retail)       [address]       [place], [place]  [address]       Ph: [phone] or [phone]       Fax: [phone]   RxID:   [id number]

## 2010-07-20 NOTE — Assessment & Plan Note (Signed)
Summary: Pulmonary/  acute ov with HFA 100%   Primary Provider/Referring Provider:  Maurice Small, MD  CC:  Acute visit.  pt c/o cough x 1 wk- prod with white sputum.  She also c/o increased SOB and wheezing.  Was seen by physician in [place] and given zpack and depomedrol.Carrie [name]  History of Present Illness:  57  yowf never smoker c/o  cough since 2000  day > night, dry > wet  and exacerbated during times when she has URI.  Former workup  included allergy testing that showed only mild positivity and no response immunotherapy  1990s by Dr. Corinda Gubler,  negative FOB  in 2004, and only controlled as inpt with outpt nonadherence documented repeatedly.  08/02/07 c/o 3 months worsening dyspnea  after exac of cough stopped her from exercising.   when asked  if anything it ever help the cough she said no.  In fact the record indicates complete resolution during hospitalization 2004.  Seen 12/11/07  50% improvement, given prednisone taper and started on Symbicort 80/4.53mcg 2 pufs two times a day.    01/01/08 cough completely resolved but still doe x anything more than slow adl's.  overall though admits these she is the best she's been in years  . Increased symbicort to 160/4.5 2 two times a day   01/29/08--ov  added zyrtec at hs as needed for pnds pos response  March 11, 2008 increased sob but only used neb 3 x in last month, no noct co's, no overt hb. Rec try off reglan to reduce risk of longterm side effects-  April 30, 2008 ov :  worse x 2 weeks with increase dyspnea > cough, minimally productive. 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 C/o  1 week of productive cough, that is progressively worsening over last few days. Did not bring meds as requested in writing.     December 26, 2008 confused with meds again, not using prns aggressively. thinks nerves are the issue. cough  same as always, dry daytime barking quality.  --Rx-steroid taper and restarted on reglan  January 12, 2009  ov:  med review. Improved from last visit. Cough is much better. Is taking reglan four times a day , using cough meds as needed. Less use of combivent.  May 11, 2009 ov rhinitis  no better with astepro, much better with atrovent.  Cough resolved. Still doe but not using rescue appropriately.  July 13, 2009 Followup with PFT's.  Pt c/o increased SOB with exertion x 2-3 wks.  She also c/o still having a dry cough.  She has noticed some wheezing when she goes outside.  rec change  to dulera  200 2 puffs first thing  in am and 2 puffs again in pm about 12 hours later   August 13, 2009 Acute visit.  pt c/o cough x 1 wk- prod with white sputum.  She also c/o increased SOB and wheezing.  Was seen by physician in [place] and given zpack and depomedrol.  using med calendar more consistently now to self manage.  Pt denies any significant sore throat, nasal congestion or excess secretions, fever, chills, sweats, unintended wt loss, pleuritic or exertional cp, orthopnea pnd  or leg swelling.  Pt also denies any obvious fluctuation in symptoms with weather or environmental change or other alleviating or aggravating factors.          Current Medications (verified): 1)  Multivitamins   Tabs (Multiple Vitamin) .... Take 1 Tablet By Mouth Once A Day 2)  Welchol 625 Mg  Tabs (Colesevelam Hcl) .... Take 2 Tablets By Mouth Three Times A Day 3)  Torsemide 20 Mg  Tabs (Torsemide) .... Take 1 Tablet By Mouth Once A Day 4)  Ery-Tab 250 Mg Tbec (Erythromycin Base) .... Take One Tablet 30 Minutes Before Breakfast,lunch,dinner and At Bedtime. 5)  Zegerid Otc 20-1100 Mg Caps (Omeprazole-Sodium Bicarbonate) .... Take 1 Capsule By Mouth Two Times A Day 6)  Reglan 10 Mg  Tabs (Metoclopramide Hcl) .... By Mouth  Before Each Meal and At Bedtime. 7)  Mucinex Dm 30-600 Mg  Tb12 (Dextromethorphan-Guaifenesin) .Carrie [name].. 1-2 By Mouth Two Times A Day As Needed 8)  Tessalon 200 Mg  Caps (Benzonatate) .Carrie [name].. 1 Every 8 Hours As Needed 9)  Tramadol Hcl 50 Mg Tabs (Tramadol Hcl) .Carrie [name].. 1 Every 4 Hours As Needed 10)  Travel Sickness 25 Mg  Chew (Meclizine Hcl) .Carrie [name].. 1 Every 8 Hours As Needed 11)  Duoneb 0.5-2.5 (3) Mg/33ml  Soln (Ipratropium-Albuterol) .... Inhale 1 Neb Via Hhn Every 4-6 Hours As Needed 12)  Combivent 103-18 Mcg/act Aero (Ipratropium-Albuterol) .... 2 Puffs Every 4-6 Hours If Needed in Place of Duoneb 13)  Lorazepam 1 Mg Tabs (Lorazepam) .Carrie [name].. 1 Two Times A Day As Needed 14)  Hyomax-Sl 0.125 Mg Subl (Hyoscyamine Sulfate) .... 2 Tabs Sublingual Every 4 Hours As Needed For Pain 15)  Atrovent 0.06 % Soln (Ipratropium Bromide) .... 2 Puffs Every 6hours 16)  Dulera 200-5 Mcg/act Aero (Mometasone Furo-Formoterol Fum) .... 2 Puffs First Thing  in Am and 2 Puffs Again in Pm About 12 Hours Later 17)  Prednisone 10 Mg  Tabs (Prednisone) .... 4 Each Am X 2days, 2x2days, 1x2days and Stop  Allergies (verified): 1)  ! Asa 2)  ! Codeine 3)  ! Sulfa 4)  ! Morphine 5)  ! Pcn  Past History:  Past Medical History: COUGH (ICD-786.2) onset about 33    - Flared off Reglan April 30, 2008     - Changed Reglan > erythromcyin per Arlyce Dice 08/2008    - Add back reglan December 26, 2008     - Sinus ct 03/17/09 nl  Hyperlipidemia Anxiety Chronic Asthma, severe.............................................................................Carrie KitchenWert   - FEV1 29%, ratio 43% with 16% improvement after bronchodilators 11/02/2007   - FEV1 33%, ratio 47,  with 14% improvment after B2 July 13, 2009 with DLC0 59> 125% corrected    - [facility] July 13, 2009 50 %>  100% August 13, 2009   Complex med regimen-  med calendar updated May 22, 2008 , .January 12, 2009  Vital Signs:  Patient profile:   70 year old female Weight:      183.38  pounds O2 Sat:      97 % on Room air Temp:     98.0 degrees F oral Pulse rate:   91 / minute BP sitting:   132 / 80  (left arm)  Vitals Entered By: Vernie Murders (August 13, 2009 10:48 AM)  O2 Flow:  Room air  Physical Exam  Additional Exam:  wt  193 September 22, 2008 >>191 Nov 03, 2008 > 188 January 12, 2009 > 191 March 13, 2009 > 189 April 02, 2009 > 187  May 11, 2009 > 187 July 13, 2009  > 183 August 13, 2009  HEENT mild turbinate edema.  Oropharynx no thrush or excess pnd or cobblestoning.  No JVD or cervical adenopathy. Mild accessory muscle hypertrophy. Trachea midline, nl thryroid. Chest  decreased in bases, trace end exp  wheezing but also with prominent pseudowheeze resolves with purse lip maneuver   Regular rate and rhythm without murmur gallop or rub or increase P2 or edema.  Abd: no hsm, nl excursion. Ext warm without cyanosis or clubbing.     Impression & Recommendations:  Problem # 1:  ASTHMATIC BRONCHITIS, ACUTE (ICD-466.0)  Her updated medication list for this problem includes:    Ery-tab 250 Mg Tbec (Erythromycin base) .Carrie [name]... Take one tablet 30 minutes before breakfast,lunch,dinner and at bedtime.    Mucinex Dm 30-600 Mg Tb12 (Dextromethorphan-guaifenesin) .Carrie [name]... 1-2 by mouth two times a day as needed    Tessalon 200 Mg Caps (Benzonatate) .Carrie [name]... 1 every 8 hours as needed    Duoneb 0.5-2.5 (3) Mg/49ml Soln (Ipratropium-albuterol) ..... Inhale 1 neb via hhn every 4-6 hours as needed    Combivent 103-18 Mcg/act Aero (Ipratropium-albuterol) .Carrie [name]... 2 puffs every 4-6 hours if needed in place of duoneb    Dulera 200-5 Mcg/act Aero (Mometasone furo-formoterol fum) .Carrie [name]... 2 puffs first thing  in am and 2 puffs again in pm about 12 hours later   Explained natural h/o uri and why it's necessary in patients at risk to rx short term with PPI to reduce risk of evolving cyclical cough triggered by epithelial injury and a heightened sensitivty to the effects of any upper airway  irritants,  most importantly acid - related   I spent extra time with the patient today explaining optimal mdi  technique.  This improved from  75-90% effective  Problem # 2:  GASTROPARESIS (ICD-536.3) by trial and error unable to maintain off reglan.  PleDiscussed in detail all the  indications, usual  risks and alternatives  relative to the benefits with patient who agrees to proceed with  reglan use.  Not clear she needs both eyrthromycin and reglan.  Needs new med calendar nex  Medications Added to Medication List This Visit: 1)  Lorazepam 1 Mg Tabs (Lorazepam) .Carrie [name].. 1 two times a day as needed 2)  Prednisone 10 Mg Tabs (Prednisone) .... 4 each am x 2days, 2x2days, 1x2days and stop  Other Orders: Est. Patient Level IV ([address])  Patient Instructions: 1)  See Tammy NP w/in 2 weeks with all your medications, even over the counter meds, separated in two separate bags, the ones you take no matter what vs the ones you stop once you feel better and take only as needed.  She will generate for you a new user friendly medication calendar that will put [place] all on the same page re: your medication use. 2)  Prednisone 10 mg 4 each am x 2days, 2x2days, 1x2days and stop  Prescriptions: PREDNISONE 10 MG  TABS (PREDNISONE) 4 each am x 2days, 2x2days, 1x2days and stop  #14 x 0   Entered and Authorized by:   Nyoka Cowden MD   Signed by:   Nyoka Cowden MD on 08/13/2009   Method used:   Electronically to        [facility]* (retail)       [address]       [place], [place]  [address]  Ph: [phone] or [phone]       Fax: [phone]   RxID:   [id number]

## 2010-07-20 NOTE — Progress Notes (Signed)
Summary: Ery-Tab refills  Phone Note Refill Request Call back at Home Phone [phone] Message from:  Fax from Pharmacy on April 27, 2010 10:41 AM  Refills Requested: Medication #1:  ERY-TAB 250 MG TBEC Take one tablet 30 minutes before breakfast   Dosage confirmed as above?Dosage Confirmed   Brand Name Necessary? No   Supply Requested: 6 months PT WILL NEED FOLLOW UP VISIT FOR ANY FURTHER REFILLS  Initial call taken by: Merri Ray CMA Duncan Dull),  April 27, 2010 10:41 AM     Appended Document: Ery-Tab refills    Clinical Lists Changes  Medications: Rx of ERY-TAB 250 MG TBEC (ERYTHROMYCIN BASE) Take one tablet 30 minutes before breakfast,lunch,dinner and at bedtime.;  #120 x 6;  Signed;  Entered by: Merri Ray CMA (AAMA);  Authorized by: Louis Meckel MD;  Method used: Electronically to [facility] [address], [place], [place], [place]  [address], Ph: [phone] or [phone], Fax: [phone]    Prescriptions: ERY-TAB 250 MG TBEC (ERYTHROMYCIN BASE) Take one tablet 30 minutes before breakfast,lunch,dinner and at bedtime.  #120 x 6   Entered by:   Merri Ray CMA (AAMA)   Authorized by:   Louis Meckel MD   Signed by:   Merri Ray CMA (AAMA) on 04/27/2010   Method used:   Electronically to        [facility]* (retail)       [address]       [place], [place]  [address]       Ph: [phone] or [phone]       Fax: [phone]   RxID:   [id number]

## 2010-07-20 NOTE — Progress Notes (Signed)
Summary: order status  Phone Note Call from Patient Call back at Home Phone [phone]   Caller: Patient Call For: Kayln Garceau Reason for Call: Talk to Nurse Summary of Call: Wants to check on the status of her order for nebulizer throught [facility]. Initial call taken by: Darletta Moll,  January 11, 2010 9:43 AM  Follow-up for Phone Call        called and spoke with pt  and she stated that she is waiting on [facility] for her nebulizer.  spoke with pt and she is aware that the order has been faxed and they will contact her about bringing this out to pt--refaxed order to [phone] to [facility]  Randell Loop CMA  January 11, 2010 10:09 AM

## 2010-07-20 NOTE — Assessment & Plan Note (Signed)
Summary: Pulmonary/ ext ov with hfa teaching    Primary Provider/Referring Provider:  Maurice Small, MD  CC:  Followup with PFT's.  Pt c/o increased SOB with exertion x 2-3 wks.  She also c/o still having a dry cough.  She has noticed some wheezing when she goes outside.  Carrie [name]  History of Present Illness:  70  yowf never smoker c/o  cough since 2000  day > night, dry > wet  and exacerbated during times when she has URI.  Former workup  included allergy testing that showed only mild positivity and no response immunotherapy  1990s by Dr. Corinda Gubler,  negative FOB  in 2004, and only controlled as inpt with outpt nonadherence documented repeatedly.  08/02/07 c/o 3 months worsening dyspnea  after exac of cough stopped her from exercising.   when asked  if anything it ever help the cough she said no.  In fact the record indicates complete resolution during hospitalization 2004.  Seen 12/11/07  50% improvement, given prednisone taper and started on Symbicort 80/4.68mcg 2 pufs two times a day.    01/01/08 cough completely resolved but still doe x anything more than slow adl's.  overall though admits these she is the best she's been in years  . Increased symbicort to 160/4.5 2 two times a day   01/29/08--ov  added zyrtec at hs as needed for pnds pos response  March 11, 2008 increased sob but only used neb 3 x in last month, no noct co's, no overt hb. Rec try off reglan to reduce risk of longterm side effects-  April 30, 2008 ov :  worse x 2 weeks with increase dyspnea > cough, minimally productive. 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 C/o  1 week of productive cough, that is progressively worsening over last few days. Did not bring meds as requested in  writing.    December 26, 2008 confused with meds again, not using prns aggressively. thinks nerves are the issue. cough  same as always, dry daytime barking quality.  --Rx-steroid taper and restarted on reglan  January 12, 2009 --Returns for follow up and med review. Improved from last visit. Cough is much better. Is taking reglan four times a day , using cough meds as needed. Less use of combivent.  February 20, 2009--Presents for dry cough, wheezing, increased SOB x6days and sinus pressure/congestion x4days with yellow drainage rx doxy and prednisone > some better but never cleared.  March 13, 2009 ov c/o never completely better since the end of August 2010  with day > night white and thick and chokes/gags on it but no vomiting.  Assoc with drippy nose no better on chlortrimeton.  April 02, 2009 2 wk followup.  Pt states that cough has improved some.  Still coughs up white sputum- much less.  She c/o "constant drippy nose"  = watery blowing nasal discharge  all the time no response to chlortrimeton but cough is better.  Stop blowing symbicort out thru nose stop chlortrimeton try astepro for nasal drainage  - found that stop symbicort reduced drainage, and when it recurs it improves with astepro  May 11, 2009 ov no better with astepro, much  better with atrovent.  Cough now resolved. Still doe but not using rescue appropriately.  [birth year] 70 Followup with PFT's.  Pt c/o increased SOB with exertion x 2-3 wks.  She also c/o still having a dry cough.  She has noticed some wheezing when she goes outside.   Pt denies any significant sore throat, dysphagia, itching, sneezing,  nasal congestion or excess secretions,  fever, chills, sweats, unintended wt loss, pleuritic or exertional cp, hempoptysis, change in activity tolerance  orthopnea pnd or leg swelling           Current Medications (verified): 1)  Multivitamins   Tabs (Multiple Vitamin) .... Take 1 Tablet By Mouth Once A Day 2)   Welchol 625 Mg  Tabs (Colesevelam Hcl) .... Take 2 Tablets By Mouth Three Times A Day 3)  Symbicort 160-4.5 Mcg/act  Aero (Budesonide-Formoterol Fumarate) .... Inhale 2 Puffs Two Times A Day 4)  Torsemide 20 Mg  Tabs (Torsemide) .... Take 1 Tablet By Mouth Once A Day 5)  Ery-Tab 250 Mg Tbec (Erythromycin Base) .... Take One Tablet 30 Minutes Before Breakfast,lunch,dinner and At Bedtime. 6)  Zegerid Otc 20-1100 Mg Caps (Omeprazole-Sodium Bicarbonate) .... Take 1 Capsule By Mouth Two Times A Day 7)  Reglan 10 Mg  Tabs (Metoclopramide Hcl) .... By Mouth Before Each Meal and At Bedtime. 8)  Mucinex Dm 30-600 Mg  Tb12 (Dextromethorphan-Guaifenesin) .Carrie [name].. 1-2 By Mouth Two Times A Day As Needed 9)  Tessalon 200 Mg  Caps (Benzonatate) .Carrie [name].. 1 Every 8 Hours As Needed 10)  Tramadol Hcl 50 Mg Tabs (Tramadol Hcl) .Carrie [name].. 1 Every 4 Hours As Needed 11)  Travel Sickness 25 Mg  Chew (Meclizine Hcl) .Carrie [name].. 1 Every 8 Hours As Needed 12)  Duoneb 0.5-2.5 (3) Mg/40ml  Soln (Ipratropium-Albuterol) .... Inhale 1 Neb Via Hhn Every 4-6 Hours As Needed 13)  Combivent 103-18 Mcg/act Aero (Ipratropium-Albuterol) .... 2 Puffs Every 4-6 Hours If Needed in Place of Duoneb 14)  Alprazolam 0.5 Mg  Tabs (Alprazolam) .... 1/2 - 1 Whole Tab Every 6 Hours As Needed 15)  Hyomax-Sl 0.125 Mg Subl (Hyoscyamine Sulfate) .... 2 Tabs Sublingual Every 4 Hours As Needed For Pain 16)  Atrovent 0.06 % Soln (Ipratropium Bromide) .... 2 Puffs Every 6hours  Allergies (verified): 1)  ! Asa 2)  ! Codeine 3)  ! Sulfa 4)  ! Morphine 5)  ! Pcn  Past History:  Past Medical History: COUGH (ICD-786.2) onset about 50    - Flared off Reglan April 30, 2008     - Changed Reglan > erythromcyin per Arlyce Dice 08/2008    - Add back reglan December 26, 2008     - Sinus ct 03/17/09 nl  Hyperlipidemia Anxiety Chronic Asthma, severe.............................................................................Carrie KitchenWert   - FEV1 29%, ratio 43% with 16% improvement after  bronchodilators 11/02/2007   - FEV1 33%, ratio 47,  with 14% improvment after B2 January [birth year] 70 with DLC0 59> 125% corrected    - [facility] January 24, [birth year] 50 %  Complex med regimen-  med calendar updated May 22, 2008 , .January 12, 2009  Vital Signs:  Patient profile:   70 year old female Height:      66 inches Weight:      187 pounds O2 Sat:      95 % on Room air Temp:     97.9 degrees F oral Pulse rate:   82 / minute BP sitting:   120 / 80  (left arm)  Vitals Entered By:  Vernie Murders ([birth year] 9:43 AM)  O2 Flow:  Room air  Physical Exam  Additional Exam:  wt  193 September 22, 2008 >>191 Nov 03, 2008 > 188 January 12, 2009 > 191 March 13, 2009 > 189 April 02, 2009 > 187  May 11, 2009 > 187 [birth year]   HEENT mild turbinate edema.  Oropharynx no thrush or excess pnd or cobblestoning.  No JVD or cervical adenopathy. Mild accessory muscle hypertrophy. Trachea midline, nl thryroid. Chest  decreased in bases, no wheezing.  pseudowheeze resolves with purse lip maneuver   Regular rate and rhythm without murmur gallop or rub or increase P2 or edema.  Abd: no hsm, nl excursion. Ext warm without cyanosis or clubbing.     Impression & Recommendations:  Problem # 1:  INTRINSIC ASTHMA, UNSPECIFIED (ICD-493.10) I had an extended discussion with the patient today lasting 15 to 20 minutes of a 25 minute visit on the following issues:  I spent extra time with the patient today explaining optimal mdi  technique.  This improved from  25-50% - problem is related to short Ti.  PFT's reviewed and support severe chronic asthma, not copd. already failed advair and singulair, not maintaining control on symbicort, albeit with suboptimal technique.  Try dulera and if fails consider option : change to Brovana and Budesonide, the equivalent of Symbicort, per neb.     Each maintenance medication was reviewed in detail including most importantly the difference between maintenance and as  needed and under what circumstances the prns are to be used. This was done in the context of a medication calendar review which provided the patient with a user-friendly unambiguous mechanism for medication administration and reconciliation and provides an action plan for all active problems. It is critical that this be shown to every doctor  for modification during the office visit if necessary so the patient can use it as a working document.   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  Medications Added to Medication List This Visit: 1)  Combivent 103-18 Mcg/act Aero (Ipratropium-albuterol) .... 2 puffs every 4-6 hours if needed in place of duoneb 2)  Dulera 200-5 Mcg/act Aero (Mometasone furo-formoterol fum) .... 2 puffs first thing  in am and 2 puffs again in pm about 12 hours later 3)  Prednisone 10 Mg Tabs (Prednisone) .... 4 each am x 2days, 2x2days, 1x2days and stop  Other Orders: Est. Patient Level IV ([address])  Patient Instructions: 1)  Dulera 200 2 puffs first thing  in am and 2 puffs again in pm about 12 hours later instead of symbicort 2)  Prednisone 4 each am x 2days, 2x2days, 1x2days and stop  3)  Work on inhaler technique:  relax and blow all the way out then take a nice smooth deep breath back in, triggering the inhaler at same time you start breathing in  4)  Please schedule a follow-up appointment in 1 month. Prescriptions: PREDNISONE 10 MG  TABS (PREDNISONE) 4 each am x 2days, 2x2days, 1x2days and stop  #14 x 0   Entered and Authorized by:   Nyoka Cowden MD   Signed by:   Nyoka Cowden MD on 07/13/2009   Method used:   Electronically to  [facility]* (retail)       [address]       [place],  [place]  [address]       Ph: [phone] or [phone]       Fax: [phone]   RxID:   [id number] COMBIVENT 103-18 MCG/ACT AERO (IPRATROPIUM-ALBUTEROL) 2 puffs every 4-6 hours if needed in place of duoneb  #1 x 11   Entered and Authorized by:   Nyoka Cowden MD   Signed by:   Nyoka Cowden MD on 07/13/2009   Method used:   Electronically to        [facility]* (retail)       [address]       [place], [place]  [address]       Ph: [phone] or [phone]       Fax: [phone]   RxID:   [id number]

## 2010-07-20 NOTE — Progress Notes (Signed)
Summary: Mouth sore, try clotrimazole loz and off dulera  Phone Note Call from Patient   Summary of Call: mouth sore.   rec try off dulera, use neb if needed and try clortimazole troche    New/Updated Medications: CLOTRIMAZOLE 10 MG LOZG (CLOTRIMAZOLE) one four time daily as needed for mouth and throat sore Prescriptions: CLOTRIMAZOLE 10 MG LOZG (CLOTRIMAZOLE) one four time daily as needed for mouth and throat sore  #24 x 0   Entered and Authorized by:   Nyoka Cowden MD   Signed by:   Nyoka Cowden MD on 11/23/2009   Method used:   Electronically to        [facility]* (retail)       [address]       [place], [place]  [address]       Ph: [phone] or [phone]       Fax: [phone]   RxID:   [id number]

## 2010-07-20 NOTE — Miscellaneous (Signed)
Summary: Power of [employer] of Attorney   Imported By: Lester [address] 04/01/2010 07:57:06  _____________________________________________________________________  External Attachment:    Type:   Image     Comment:   External Document

## 2010-07-20 NOTE — Assessment & Plan Note (Signed)
Summary: Flu Vaccination  Nurse Visit   Allergies: 1)  ! Asa 2)  ! Codeine 3)  ! Sulfa 4)  ! Morphine 5)  ! Pcn 6)  ! Reglan (Metoclopramide Hcl)  Orders Added: 1)  Flu Vaccine 69yrs + [90658] 2)  Administration Flu vaccine - MCR [G0008] Flu Vaccine Consent Questions     Do you have a history of severe allergic reactions to this vaccine? no    Any prior history of allergic reactions to egg and/or gelatin? no    Do you have a sensitivity to the preservative Thimersol? no    Do you have a past history of Guillan-Barre Syndrome? no    Do you currently have an acute febrile illness? no    Have you ever had a severe reaction to latex? no    Vaccine information given and explained to patient? yes    Are you currently pregnant? no    Lot Number:[id number]   Exp Date:12/17/2009   Site Given  Left Deltoid IMu  Boone Master CNA/MA  February 24, 2010 12:15 PM

## 2010-07-20 NOTE — Procedures (Signed)
Summary: Oximetry/[facility]  Oximetry/[facility]   Imported By: Lester [place] 12/08/2009 08:19:37  _____________________________________________________________________  External Attachment:    Type:   Image     Comment:   External Document  Appended Document: Oximetry/[facility] Per MW- results show that pt does not qualify for nocturnal o2.  I spoke with pt and notified of these results.

## 2010-07-20 NOTE — Progress Notes (Signed)
Summary: Call in diflucan 100 x 5 days for thrush  Phone Note Outgoing Call   Summary of Call: still having thrush, try diflucan x 5 days Initial call taken by: Nyoka Cowden MD,  March 10, 2010 1:08 PM    New/Updated Medications: DIFLUCAN 100 MG TABS (FLUCONAZOLE) one daily x 5 days Prescriptions: DIFLUCAN 100 MG TABS (FLUCONAZOLE) one daily x 5 days  #5 x 11   Entered and Authorized by:   Nyoka Cowden MD   Signed by:   Nyoka Cowden MD on 03/10/2010   Method used:   Electronically to        [facility] (retail)       [address]       [place], [place]  [address]       Ph: [phone] or [phone]       Fax: [phone]   RxID:   [id number]

## 2010-07-20 NOTE — Miscellaneous (Signed)
Summary: Natural Death papers  Natural Death papers   Imported By: Lester [place] 04/01/2010 07:59:02  _____________________________________________________________________  External Attachment:    Type:   Image     Comment:   External Document

## 2010-07-20 NOTE — Assessment & Plan Note (Signed)
Summary: NP follow up - med calendar   Primary Provider/Referring Provider:  Maurice Small, MD  CC:  est med calendar - pt brought all meds today.  would like something to help her nerves and lost her son suddenly last month..  History of Present Illness:  70  yowf never smoker c/o  cough since 2000  day > night, dry > wet  and exacerbated during times when she has URI.  Former workup  included allergy testing that showed only mild positivity and no response immunotherapy  1990s by Dr. Corinda Gubler,  negative FOB  in 2004, and only controlled as inpt with outpt nonadherence documented repeatedly.  08/02/07 c/o 3 months worsening dyspnea  after exac of cough stopped her from exercising.   when asked  if anything it ever help the cough she said no.  In fact the record indicates complete resolution during hospitalization 2004.  Seen 12/11/07  50% improvement, given prednisone taper and started on Symbicort 80/4.13mcg 2 pufs two times a day.    01/01/08 cough completely resolved but still doe x anything more than slow adl's.  overall though admits these she is the best she's been in years  . Increased symbicort to 160/4.5 2 two times a day   01/29/08--ov  added zyrtec at hs as needed for pnds pos response  March 11, 2008 increased sob but only used neb 3 x in last month, no noct co's, no overt hb. Rec try off reglan to reduce risk of longterm side effects-  April 30, 2008 ov :  worse x 2 weeks with increase dyspnea > cough, minimally productive. 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 C/o  1 week of productive cough, that is progressively worsening over last few days. Did not bring meds as requested in writing.    December 26, 2008 confused with meds  again, not using prns aggressively. thinks nerves are the issue. cough  same as always, dry daytime barking quality.  --Rx-steroid taper and restarted on reglan  January 12, 2009  ov:  med review. Improved from last visit. Cough is much better. Is taking reglan four times a day , using cough meds as needed. Less use of combivent.  May 11, 2009 ov rhinitis  no better with astepro, much better with atrovent.  Cough resolved. Still doe but not using rescue appropriately.  July 13, 2009 Followup with PFT's.  Pt c/o increased SOB with exertion x 2-3 wks.  She also c/o still having a dry cough.  She has noticed some wheezing when she goes outside.  rec change  to dulera  200 2 puffs first thing  in am and 2 puffs again in pm about 12 hours later   August 13, 2009 Acute visit.  pt c/o cough x 1 wk- prod with white sputum.  She also c/o increased SOB and wheezing.  Was seen by physician in [place] and given zpack and depomedrol.  using med calendar more consistently now to self manage.     September 07, 2009--Presents for follow up and med review. last visit w/ flare tx w/ steroids. IMproved back to baseline. She is still very upset about the sudden unexpected loss  of her son last month. She is not tolerating the reglan, causing hand wringing, legs jumpy, fatigue, no energy. We reviewed all her meds and updated her med calendar. Denies chest pain, dyspnea, orthopnea, hemoptysis, fever, n/v/d, edema, headache.   Medications Prior to Update: 1)  Multivitamins   Tabs (Multiple Vitamin) .... Take 1 Tablet By Mouth Once A Day 2)  Welchol 625 Mg  Tabs (Colesevelam Hcl) .... Take 2 Tablets By Mouth Three Times A Day 3)  Torsemide 20 Mg  Tabs (Torsemide) .... Take 1 Tablet By Mouth Once A Day 4)  Ery-Tab 250 Mg Tbec (Erythromycin Base) .... Take One Tablet 30 Minutes Before Breakfast,lunch,dinner and At Bedtime. 5)  Zegerid Otc 20-1100 Mg Caps (Omeprazole-Sodium Bicarbonate) .... Take 1 Capsule By Mouth Two Times A  Day 6)  Reglan 10 Mg  Tabs (Metoclopramide Hcl) .... By Mouth Before Each Meal and At Bedtime. 7)  Mucinex Dm 30-600 Mg  Tb12 (Dextromethorphan-Guaifenesin) .[name].. 1-2 By Mouth Two Times A Day As Needed 8)  Tessalon 200 Mg  Caps (Benzonatate) .[name].. 1 Every 8 Hours As Needed 9)  Tramadol Hcl 50 Mg Tabs (Tramadol Hcl) .[name].. 1 Every 4 Hours As Needed 10)  Travel Sickness 25 Mg  Chew (Meclizine Hcl) .[name].. 1 Every 8 Hours As Needed 11)  Duoneb 0.5-2.5 (3) Mg/6ml  Soln (Ipratropium-Albuterol) .... Inhale 1 Neb Via Hhn Every 4-6 Hours As Needed 12)  Combivent 103-18 Mcg/act Aero (Ipratropium-Albuterol) .... 2 Puffs Every 4-6 Hours If Needed in Place of Duoneb 13)  Lorazepam 1 Mg Tabs (Lorazepam) .[name].. 1 Two Times A Day As Needed 14)  Hyomax-Sl 0.125 Mg Subl (Hyoscyamine Sulfate) .... 2 Tabs Sublingual Every 4 Hours As Needed For Pain 15)  Atrovent 0.06 % Soln (Ipratropium Bromide) .... 2 Puffs Every 6hours 16)  Dulera 200-5 Mcg/act Aero (Mometasone Furo-Formoterol Fum) .... 2 Puffs First Thing  in Am and 2 Puffs Again in Pm About 12 Hours Later 17)  Prednisone 10 Mg  Tabs (Prednisone) .... 4 Each Am X 2days, 2x2days, 1x2days and Stop 18)  Prednisone 10 Mg  Tabs (Prednisone) .... 4 Each Am X 2days, 2x2days, 1x2days and Stop  Allergies (verified): 1)  ! Asa 2)  ! Codeine 3)  ! Sulfa 4)  ! Morphine 5)  ! Pcn 6)  ! Reglan (Metoclopramide Hcl)  Past History:  Past Medical History: Last updated: 08/13/2009 COUGH (ICD-786.2) onset about 1999    - Flared off Reglan April 30, 2008     - Changed Reglan > erythromcyin per Arlyce Dice 08/2008    - Add back reglan December 26, 2008     - Sinus ct 03/17/09 nl  Hyperlipidemia Anxiety Chronic Asthma, severe.............................................................................Marland KitchenWert   - FEV1 29%, ratio 43% with 16% improvement after bronchodilators 11/02/2007   - FEV1 33%, ratio 47,  with 14% improvment after B2 July 13, 2009 with DLC0 59> 125% corrected    -  [facility] July 13, 2009 50 %>  100% August 13, 2009   Complex med regimen-  med calendar updated May 22, 2008 , .January 12, 2009  Past Surgical History: Last updated: 09/08/2008 Appendectomy Tubal Ligation Cystic surgery on breast-bilateral Lt. Ankle Surgery  Family History: Last updated: 08/18/2008 mother died at 66 from heart attack father died from heart attack sister 1 died from cirrhosis of liver and subsequent staff infection sister 2 is a diabetic sister 3 has hx of heart attack and stents brother has hx of heart disease and diabetes No FH  of Colon Cancer: Family History of Breast Cancer:mother Family History of Ovarian Cancer: Sister x 2 Family History of Colon Polyps: Mother  Social History: Last updated: 08/06/2008 she has never smoked was previously exposed to her husband's cigarettes occasionally exercises no caffeine no etoh married 2 children Illicit Drug Use - no  Risk Factors: Smoking Status: never (03/11/2008)  Review of Systems      See HPI  Vital Signs:  Patient profile:   70 year old female Height:      66 inches Weight:      183.13 pounds BMI:     29.66 O2 Sat:      91 % on Room air Temp:     98.8 degrees F Pulse rate:   86 / minute BP sitting:   114 / 64  (left arm) Cuff size:   regular  Vitals Entered By: Boone Master CNA (September 07, 2009 2:18 PM)  O2 Flow:  Room air CC: est med calendar - pt brought all meds today.  would like something to help her nerves, lost her son suddenly last month. Is Patient Diabetic? No Comments Medications reviewed with patient Daytime contact number verified with patient. Boone Master CNA  September 07, 2009 2:20 PM    Physical Exam  Additional Exam:  wt  193 September 22, 2008 >>191 Nov 03, 2008 > 188 January 12, 2009 > 191 March 13, 2009 > 189 April 02, 2009 > 187  May 11, 2009 > 187 July 13, 2009  > 183 August 13, 2009 >>183 September 07, 2009 HEENT mild turbinate edema.  Oropharynx no thrush  or excess pnd or cobblestoning.  No JVD or cervical adenopathy. Mild accessory muscle hypertrophy. Trachea midline, nl thryroid. Chest  decreased in bases, no wheezing.  Regular rate and rhythm without murmur gallop or rub or increase P2 or edema.  Abd: no hsm, nl excursion. Ext warm without cyanosis or clubbing.     Impression & Recommendations:  Problem # 1:  ASTHMATIC BRONCHITIS, ACUTE (ICD-466.0)  Recent flare now resolved.  Meds reviewed with pt education and computerized med calendar completed/adjusted.    Her updated medication list for this problem includes:    Ery-tab 250 Mg Tbec (Erythromycin base) .[name]... Take one tablet 30 minutes before breakfast,lunch,dinner and at bedtime.    Mucinex Dm 30-600 Mg Tb12 (Dextromethorphan-guaifenesin) .[name]... 1-2 by mouth two times a day as needed    Tessalon 200 Mg Caps (Benzonatate) .[name]... 1 every 8 hours as needed    Duoneb 0.5-2.5 (3) Mg/46ml Soln (Ipratropium-albuterol) ..... Inhale 1 neb via hhn every 4-6 hours as needed    Combivent 103-18 Mcg/act Aero (Ipratropium-albuterol) .[name]... 2 puffs every 4-6 hours if needed in place of duoneb    Dulera 200-5 Mcg/act Aero (Mometasone furo-formoterol fum) .[name]... 2 puffs first thing  in am and 2 puffs again in pm about 12 hours later  Orders: Est. Patient Level IV ([address])  Problem # 2:  GASTROPARESIS (ICD-536.3)  We discussed several options she is intolerant to reglan, feel the side effects of this meds are outweighing the benefit.  Will stop reglan, use miralax/stool softner  would avoid reglan in future.   Orders: Est. Patient Level IV ([address])  Complete Medication List: 1)  Multivitamins Tabs (Multiple vitamin) .... Take 1 tablet by mouth once a day 2)  Welchol 625 Mg Tabs (Colesevelam hcl) .... Take 2 tablets by mouth three times a day 3)  Torsemide 20 Mg Tabs (Torsemide) .... Take 1 tablet  by mouth once a day 4)  Ery-tab 250 Mg Tbec (Erythromycin base) .... Take one tablet 30 minutes before  breakfast,lunch,dinner and at bedtime. 5)  Zegerid Otc 20-1100 Mg Caps (Omeprazole-sodium bicarbonate) .... Take 1 capsule by mouth two times a day 6)  Reglan 10 Mg Tabs (Metoclopramide hcl) .... By mouth before each meal and at bedtime. 7)  Mucinex Dm 30-600 Mg Tb12 (Dextromethorphan-guaifenesin) .[name].. 1-2 by mouth two times a day as needed 8)  Tessalon 200 Mg Caps (Benzonatate) .[name].. 1 every 8 hours as needed 9)  Tramadol Hcl 50 Mg Tabs (Tramadol hcl) .[name].. 1 every 4 hours as needed 10)  Travel Sickness 25 Mg Chew (Meclizine hcl) .[name].. 1 every 8 hours as needed 11)  Duoneb 0.5-2.5 (3) Mg/47ml Soln (Ipratropium-albuterol) .... Inhale 1 neb via hhn every 4-6 hours as needed 12)  Combivent 103-18 Mcg/act Aero (Ipratropium-albuterol) .... 2 puffs every 4-6 hours if needed in place of duoneb 13)  Lorazepam 1 Mg Tabs (Lorazepam) .[name].. 1 two times a day as needed 14)  Hyomax-sl 0.125 Mg Subl (Hyoscyamine sulfate) .... 2 tabs sublingual every 4 hours as needed for pain 15)  Atrovent 0.06 % Soln (Ipratropium bromide) .... 2 puffs every 6hours 16)  Dulera 200-5 Mcg/act Aero (Mometasone furo-formoterol fum) .... 2 puffs first thing  in am and 2 puffs again in pm about 12 hours later 17)  Prednisone 10 Mg Tabs (Prednisone) .... 4 each am x 2days, 2x2days, 1x2days and stop 18)  Prednisone 10 Mg Tabs (Prednisone) .... 4 each am x 2days, 2x2days, 1x2days and stop  Patient Instructions: 1)  Stop Reglan 2)  Begin Miralax 17grm at bedtime.  3)  Add stool softner at bedtime  4)  Follow med calendar closely 5)  follow up Dr. Sherene Sires in 6 weeks and as needed   Appended Document: NP follow up - med calendar    Clinical Lists Changes  Medications: Added new medication of CRESTOR 10 MG TABS (ROSUVASTATIN CALCIUM) Take 1 tablet by mouth once a day Added new medication of MIRALAX  POWD (POLYETHYLENE GLYCOL 3350) 1 capful in water daily Added new medication of STOOL SOFTENER 100 MG CAPS (DOCUSATE SODIUM) take one  capsule at bedtime Added new medication of MECLIZINE HCL 25 MG TABS (MECLIZINE HCL) 1 tablet by mouth every 8 hours as needed Removed medication of PREDNISONE 10 MG  TABS (PREDNISONE) 4 each am x 2days, 2x2days, 1x2days and stop Removed medication of PREDNISONE 10 MG  TABS (PREDNISONE) 4 each am x 2days, 2x2days, 1x2days and stop Removed medication of ATROVENT 0.06 % SOLN (IPRATROPIUM BROMIDE) 2 puffs every 6hours Removed medication of COMBIVENT 103-18 MCG/ACT AERO (IPRATROPIUM-ALBUTEROL) 2 puffs every 4-6 hours if needed in place of duoneb Removed medication of TRAVEL SICKNESS 25 MG  CHEW (MECLIZINE HCL) 1 every 8 hours as needed Added new medication of CHLOR-TRIMETON 4 MG TABS (CHLORPHENIRAMINE MALEATE) take one tablet by mouth every 6 hours as needed Removed medication of REGLAN 10 MG  TABS (METOCLOPRAMIDE HCL) By mouth before each meal and at bedtime.

## 2010-07-20 NOTE — Assessment & Plan Note (Signed)
Summary: cough ///kp   Primary Provider/Referring Provider:  Maurice Small, MD  CC:  Acute Visit ptc/o prod cough thick white, yellow  x 1 , and increase sob x 10 days.  History of Present Illness: 70  yowf never smoker c/o  cough since 2000  day > night, dry > wet  and exacerbated during times when she has URI.  Former workup  included allergy testing that showed only mild positivity and no response immunotherapy  1990s by Dr. Corinda Gubler,  negative FOB  in 2004, and only controlled as inpt with outpt nonadherence documented repeatedly.  08/02/07 c/o 3 months worsening dyspnea  after exac of cough stopped her from exercising.   when asked  if anything it ever help the cough she said no.  In fact the record indicates complete resolution during hospitalization 2004.  Seen 12/11/07  50% improvement, given prednisone taper and started on Symbicort 80/4.72mcg 2 pufs two times a day.    01/01/08 cough completely resolved but still doe x anything more than slow adl's.  overall though admits these she is the best she's been in years  . Increased symbicort to 160/4.5 2 two times a day   01/29/08--ov  added zyrtec at hs as needed for pnds pos response  March 11, 2008 increased sob but only used neb 3 x in last month, no noct co's, no overt hb. Rec try off reglan to reduce risk of longterm side effects-  April 30, 2008 ov :  worse x 2 weeks with increase dyspnea > cough, minimally productive. 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 C/o  1 week of productive cough, that is progressively worsening over last few days. Did not bring meds as requested in writing.   cont >>> December 26, 2008 confused with meds again, not using prns aggressively. thinks nerves are  the issue. cough  same as always, dry daytime barking quality.  --Rx-steroid taper and restarted on reglan  January 12, 2009  ov:  med review. Improved from last visit. Cough is much better. Is taking reglan four times a day , using cough meds as needed. Less use of combivent.  May 11, 2009 ov rhinitis  no better with astepro, much better with atrovent.  Cough resolved. Still doe but not using rescue appropriately.  July 13, 2009 Followup with PFT's.  Pt c/o increased SOB with exertion x 2-3 wks.  She also c/o still having a dry cough.  She has noticed some wheezing when she goes outside.  rec change  to dulera  200 2 puffs first thing  in am and 2 puffs again in pm about 12 hours later   August 13, 2009 Acute visit.  pt c/o cough x 1 wk- prod with white sputum.  She also c/o increased SOB and wheezing.  Was seen by physician in [place] and given zpack and depomedrol.  using med calendar more consistently now to self manage.     September 07, 2009-- med review. last visit w/ flare tx w/ steroids. IMproved back to baseline.  rec d/c reglan due to shaking  Oct 19, 2009 6 wk followup.  Pt denies any complaints today.  States that  her breathing has been okay.  No cough   December 01, 2009--Presents for  follow up for thrush and trial off dulera.  Last week called in w/ thrush. She was given mycelex. Has 2 days left. Mouth/tongue are still sore. She was recommended to stop dulera.   cont>> January 08, 2010 ov 1 month followup.  Pt c/o increased SOB since more humid weather.  She states she is "in and out" all day running errands and doing yardwork and gets out of breath easily.  She also c/o frequent need to clear throat and has some prod cough with white sputum. brought meds and med calendar with multiple inconsistencies.  can't tolerate dulera, not doing as well on symbicort in terms of symptom control.  no noct or early am flares --Changed back to budesonide/brovana neb.   January 21, 2010--Presents for  follow up and med review. She was changed back to Budesonide and Brovana last visit. She is feeling better -best she has been in a while. She has brought all her meds. We organized them and updated her med calendar. She got assistance with her neb meds and now can afford them.  She is feeling some better, has been under alot of stress w/ recent death of son and daughter is sick alot w/  skin condition.   on clindamycin and erthromycin too > Thrush on neb > no better with magic mouthwash > started dlflucan  April 05, 2010 ov Runny nose, prod cough with white sputum x 1 wk.   Not following med calendar list of contingencies.  overall doing much better x for upper resp symptoms.  Pt denies any significant sore throat, dysphagia, itching, sneezing,  nasal congestion or excess / purulent secretions,  fever, chills, sweats, unintended wt loss, pleuritic or exertional cp, hempoptysis, change in activity tolerance  orthopnea pnd or leg swelling.  Thinks this is just her fall allergies acting up the way they always do.    April 26, 2010 --Presents for an acute office visit. Complains of productive cough thick white,  and yellow mucus. Worse for last 10 days. She was seen 3 weeks ago and tx w/ steroids with improvement in cough-but never 100% cough free. She does have nasal drip, drainage intermittently. No overt reflux. Cough is getting worse and mucus is starting to change colors. She is leaving soon for vacation and is concerned she is getting worse. Previous CT scan 2008 and 2010 w/ no acute process, stable scattered lung nodules. She is a never smoker (hx 2nd hand smoke  exposure).  Denies chest pain,  orthopnea, hemoptysis, fever, n/v/d, edema, headache,recent travel. She is using tramadol on aver 2x day to help with cough, also using chlor tabs 4mg  two times a day for drippy nose.   Preventive Screening-Counseling & Management  Alcohol-Tobacco     Smoking Status: never  Current Medications  (verified): 1)  Multivitamins   Tabs (Multiple Vitamin) .... Take 1 Tablet By Mouth Once A Day 2)  Torsemide 20 Mg  Tabs (Torsemide) .... Take 1 Tablet By Mouth Once A Day 3)  Ery-Tab 250 Mg Tbec (Erythromycin Base) .... Take One Tablet 30 Minutes Before Breakfast,lunch,dinner and At Bedtime. 4)  Zegerid Otc 20-1100 Mg Caps (Omeprazole-Sodium Bicarbonate) .... Take 1 Capsule By Mouth Two Times A Day 5)  Crestor 10 Mg Tabs (Rosuvastatin Calcium) .... Take 1 Tablet By Mouth Once A Day 6)  Milk of Magnesia 7.75 % Susp (Magnesium Hydroxide) .... Per Bottle Directions As  Needed 7)  Stool Softener 100 Mg Caps (Docusate Sodium) .... Take One Capsule At Bedtime 8)  Mucinex Dm 30-600 Mg  Tb12 (Dextromethorphan-Guaifenesin) .[name].. 1-2 By Mouth Two Times A Day As Needed 9)  Tramadol Hcl 50 Mg Tabs (Tramadol Hcl) .[name].. 1 Every 4 Hours As Needed 10)  Meclizine Hcl 25 Mg Tabs (Meclizine Hcl) .[name].. 1 Tablet By Mouth Every 8 Hours As Needed 11)  Combivent 18-103 Mcg/act Aero (Ipratropium-Albuterol) .... 2 Puffs Every 4-6 Hours As Needed 12)  Lorazepam 1 Mg Tabs (Lorazepam) .... Take 1 Tablet By Mouth Every 12 Hours As Needed 13)  Chlor-Trimeton 4 Mg Tabs (Chlorpheniramine Maleate) .[name].. 1 Every 4-6 Hours As Needed 14)  Hyomax-Sl 0.125 Mg Subl (Hyoscyamine Sulfate) .... 2 Tabs Sublingual Every 4 Hours As Needed For Pain 15)  Brovana 15 Mcg/66ml Nebu (Arformoterol Tartrate) .[name].. 1 Vial in Nebulizer Two Times A Day 16)  Budesonide 0.25 Mg/19ml Susp (Budesonide) .[name].. 1 Vial in Nebulizer Two Times A Day 17)  Magic Mouth Wash .... 1 Tsp Every 5 Hrs 18)  Evista 60 Mg Tabs (Raloxifene Hcl) .[name].. 1 Tablet Every Third Day 19)  Fluconazole 100 Mg Tabs (Fluconazole) .[name].. 1 Once Daily As Needed  Allergies (verified): 1)  ! Asa 2)  ! Codeine 3)  ! Sulfa 4)  ! Morphine 5)  ! Pcn 6)  ! Reglan (Metoclopramide Hcl)  Past History:  Past Surgical History: Last updated: 09/08/2008 Appendectomy Tubal Ligation Cystic surgery on  breast-bilateral Lt. Ankle Surgery  Family History: Last updated: 2008-08-25 mother died at 33 from heart attack father died from heart attack sister 1 died from cirrhosis of liver and subsequent staff infection sister 2 is a diabetic sister 3 has hx of heart attack and stents brother has hx of heart disease and diabetes No FH of Colon Cancer: Family History of Breast Cancer:mother Family History of Ovarian Cancer: Sister x 2 Family History of Colon Polyps: Mother  Social History: Last updated: 08/06/2008 she has never smoked was previously exposed to her husband's cigarettes occasionally exercises no caffeine no etoh married 2 children Illicit Drug Use - no  Past Medical History: COUGH (ICD-786.2) onset about 51    - Flared off Reglan April 30, 2008     - Changed Reglan > erythromcyin per Arlyce Dice 08/2008    - Add back reglan December 26, 2008 >>reglan stopped intolerant    - Sinus ct 03/17/09 nl -CT chest 11/08 scattered pulm nodules, 5/10 stable pulm nodules (never smoker)  Hyperlipidemia Anxiety Chronic Asthma, severe..............................................................................Marland KitchenWert   - FEV1 29%, ratio 43% with 16% improvement after bronchodilators 11/02/2007   - FEV1 33%, ratio 47,  with 14% improvment after B2 July 13, 2009 with DLC0 59> 125% corrected    - [facility] July 13, 2009 50 %>  100% August 13, 2009 > 75% January 08, 2010  Complex med regimen-  med calendar updated May 22, 2008 , .January 12, 2009, January 21, 2010     - Poor correlation between calendar and bags of meds January 08, 2010   Review of Systems      See HPI  Vital Signs:  Patient profile:   70 year old female Height:      66 inches Weight:      174 pounds O2 Sat:      96 % on Room air Temp:     99.2 degrees F oral Pulse rate:   94 / minute BP sitting:   120 / 70  (  left arm)  Vitals Entered By: Renold Genta RCP, LPN (April 26, 2010 11:05 AM)  O2 Flow:  Room air CC:  Acute Visit ptc/o prod cough thick white, yellow  x 1 , increase sob x 10 days Comments Medications reviewed with patient Renold Genta RCP, LPN  April 26, 2010 11:08 AM    Physical Exam  Additional Exam:  wt  193 September 22, 2008 >  181 12/01/09 > 179 January 09, 2010 >>177 August  , 2011 > 174 April 05, 2010 >>174  HEENT mild nonspecific  turbinate edema.  Oropharynx no thrush or excess pnd or cobblestoning.  No JVD or cervical adenopathy. Mild accessory muscle hypertrophy. Trachea midline, nl thryroid. Chest decreased BS in base, no wheezing,  Regular rate and rhythm without murmur gallop or rub or increase P2 or edema.  Abd: no hsm, nl excursion. Ext warm without cyanosis or clubbing.     Impression & Recommendations:  Problem # 1:  ASTHMATIC BRONCHITIS, ACUTE (ICD-466.0)  Flare w/ upper airway cough syndrome.  Plan  Mucinex DM two times a day until cough is gone, then as needed cough.  Tramadol 50mg  every 4 hr as needed if still coughing.  Saline nasal rinses as needed  May take Chlor tabs 4mg  every 4-6 hr as needed for nasal drip/drainage.  Omnicef 300mg  two times a day for 7 days Please contact office for sooner follow up if symptoms do not improve or worsen  follow up in 3 weeks as scheduled and as needed  The following medications were removed from the medication list:    Tessalon 200 Mg Caps (Benzonatate) .[name]... 1 every 8 hours as needed- stopped taking    Duoneb 0.5-2.5 (3) Mg/83ml Soln (Ipratropium-albuterol) ..... Inhale 1 neb via hhn every 4-6 hours as needed- stopped taking Her updated medication list for this problem includes:    Budesonide 0.25 Mg/58ml Susp (Budesonide) .[name]... 1 vial in nebulizer two times a day    Brovana 15 Mcg/40ml Nebu (Arformoterol tartrate) .[name]... 1 vial in nebulizer two times a day    Ery-tab 250 Mg Tbec (Erythromycin base) .[name]... Take one tablet 30 minutes before breakfast,lunch,dinner and at bedtime.    Mucinex Dm 30-600 Mg Tb12  (Dextromethorphan-guaifenesin) .[name]... 1-2 by mouth two times a day as needed    Combivent 18-103 Mcg/act Aero (Ipratropium-albuterol) .[name]... 2 puffs every 4-6 hours as needed    Cefdinir 300 Mg Caps (Cefdinir) .[name]... 1 by mouth two times a day  Orders: Est. Patient Level IV (16109)  Medications Added to Medication List This Visit: 1)  Evista 60 Mg Tabs (Raloxifene hcl) .[name].. 1 tablet every third day 2)  Fluconazole 100 Mg Tabs (Fluconazole) .[name].. 1 once daily as needed 3)  Magic Mouthwash  .... 1 tsp every 5hrs as needed 4)  Cefdinir 300 Mg Caps (Cefdinir) .[name].. 1 by mouth two times a day  Complete Medication List: 1)  Budesonide 0.25 Mg/34ml Susp (Budesonide) .[name].. 1 vial in nebulizer two times a day 2)  Brovana 15 Mcg/21ml Nebu (Arformoterol tartrate) .[name].. 1 vial in nebulizer two times a day 3)  Evista 60 Mg Tabs (Raloxifene hcl) .[name].. 1 tablet every third day 4)  Multivitamins Tabs (Multiple vitamin) .... Take 1 tablet by mouth once a day 5)  Torsemide 20 Mg Tabs (Torsemide) .... Take 1 tablet by mouth once a day 6)  Ery-tab 250 Mg Tbec (Erythromycin base) .... Take one tablet 30 minutes before breakfast,lunch,dinner and at bedtime. 7)  Zegerid  Otc 20-1100 Mg Caps (Omeprazole-sodium bicarbonate) .... Take 1 capsule by mouth two times a day 8)  Crestor 10 Mg Tabs (Rosuvastatin calcium) .... Take 1 tablet by mouth once a day 9)  Stool Softener 100 Mg Caps (Docusate sodium) .... Take one capsule at bedtime 10)  Mucinex Dm 30-600 Mg Tb12 (Dextromethorphan-guaifenesin) .[name].. 1-2 by mouth two times a day as needed 11)  Tramadol Hcl 50 Mg Tabs (Tramadol hcl) .[name].. 1 every 4 hours as needed 12)  Meclizine Hcl 25 Mg Tabs (Meclizine hcl) .[name].. 1 tablet by mouth every 8 hours as needed 13)  Combivent 18-103 Mcg/act Aero (Ipratropium-albuterol) .... 2 puffs every 4-6 hours as needed 14)  Lorazepam 1 Mg Tabs (Lorazepam) .... Take 1 tablet by mouth every 12 hours as needed 15)  Chlor-trimeton 4 Mg Tabs  (Chlorpheniramine maleate) .[name].. 1 every 4-6 hours as needed 16)  Hyomax-sl 0.125 Mg Subl (Hyoscyamine sulfate) .... 2 tabs sublingual every 4 hours as needed for pain 17)  Magic Mouth Wash  .... 1 tsp every 5 hrs 18)  Fluconazole 100 Mg Tabs (Fluconazole) .[name].. 1 once daily as needed 19)  Magic Mouthwash  .... 1 tsp every 5hrs as needed 20)  Milk of Magnesia 7.75 % Susp (Magnesium hydroxide) .... Per bottle directions as needed 21)  Cefdinir 300 Mg Caps (Cefdinir) .[name].. 1 by mouth two times a day  Patient Instructions: 1)  Mucinex DM two times a day until cough is gone, then as needed cough.  2)  Tramadol 50mg  every 4 hr as needed if still coughing.  3)  Saline nasal rinses as needed  4)  May take Chlor tabs 4mg  every 4-6 hr as needed for nasal drip/drainage.  5)  Omnicef 300mg  two times a day for 7 days 6)  Please contact office for sooner follow up if symptoms do not improve or worsen  7)  follow up in 3 weeks as scheduled and as needed  Prescriptions: CEFDINIR 300 MG CAPS (CEFDINIR) 1 by mouth two times a day  #14 x 0   Entered and Authorized by:   Rubye Oaks NP   Signed by:   Tammy Parrett NP on 04/26/2010   Method used:   Electronically to        [facility] (retail)       [address]       [place], [place]  [address]       Ph: [phone] or [phone]       Fax: [phone]   RxID:   [id number] COMBIVENT 18-103 MCG/ACT AERO (IPRATROPIUM-ALBUTEROL) 2 puffs every 4-6 hours as needed  #3 x 3   Entered and Authorized by:   Rubye Oaks NP   Signed by:   Tammy Parrett NP on 04/26/2010   Method used:   Print then Give to Patient   RxID:   [id number]

## 2010-07-20 NOTE — Assessment & Plan Note (Signed)
Summary: NP follow up - thrush, trial off dulera   Primary Provider/Referring Provider:  Maurice Small, MD  CC:  1 week follow up for thrush and trial off dulera.  having prod cough with clear mucus and wheezing and increased SOB since stopping the dulera.  History of Present Illness:  70  yowf never smoker c/o  cough since 2000  day > night, dry > wet  and exacerbated during times when she has URI.  Former workup  included allergy testing that showed only mild positivity and no response immunotherapy  1990s by Dr. Corinda Gubler,  negative FOB  in 2004, and only controlled as inpt with outpt nonadherence documented repeatedly.  08/02/07 c/o 3 months worsening dyspnea  after exac of cough stopped her from exercising.   when asked  if anything it ever help the cough she said no.  In fact the record indicates complete resolution during hospitalization 2004.  Seen 12/11/07  69% improvement, given prednisone taper and started on Symbicort 80/4.9mcg 2 pufs two times a day.    01/01/08 cough completely resolved but still doe x anything more than slow adl's.  overall though admits these she is the best she's been in years  . Increased symbicort to 160/4.5 2 two times a day   01/29/08--ov  added zyrtec at hs as needed for pnds pos response  March 11, 2008 increased sob but only used neb 3 x in last month, no noct co's, no overt hb. Rec try off reglan to reduce risk of longterm side effects-  April 30, 2008 ov :  worse x 2 weeks with increase dyspnea > cough, minimally productive. 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 C/o  1 week of productive cough, that is progressively worsening over last few days. Did not bring meds as requested in writing.   December 26, 2008 confused with meds again, not using prns aggressively. thinks nerves are the issue. cough  same as always, dry daytime barking quality.  --Rx-steroid taper and restarted on reglan  January 12, 2009  ov:  med review. Improved from last visit. Cough is much better. Is taking reglan four times a day , using cough meds as needed. Less use of combivent.  May 11, 2009 ov rhinitis  no better with astepro, much better with atrovent.  Cough resolved. Still doe but not using rescue appropriately.  July 13, 2009 Followup with PFT's.  Pt c/o increased SOB with exertion x 2-3 wks.  She also c/o still having a dry cough.  She has noticed some wheezing when she goes outside.  rec change  to dulera  200 2 puffs first thing  in am and 2 puffs again in pm about 12 hours later   August 13, 2009 Acute visit.  pt c/o cough x 1 wk- prod with white sputum.  She also c/o increased SOB and wheezing.  Was seen by physician in [place] and given zpack and depomedrol.  using med calendar more consistently now to self manage.     September 07, 2009-- med review. last visit w/ flare tx w/ steroids. IMproved back to baseline.  rec d/c reglan due to shaking  Oct 19, 2009 6 wk followup.  Pt denies any complaints today.  States that her breathing has been okay.  No cough   December 01, 2009--Presents for  follow up for thrush and trial off dulera.  Last week called in w/ thrush. She was given mycelex. Has 2 days left. Mouth/tongue are still sore. She was recommended to stop dulera. Breathing not as good after stopping.  We discussed the importance of brushing after inhaler use. She says she is doing this after each use. Denies chest pain,  orthopnea, hemoptysis, fever, n/v/d, edema, headache.   Medications Prior to Update: 1)  Multivitamins   Tabs (Multiple Vitamin) .... Take 1 Tablet By Mouth Once A Day 2)  Welchol 625 Mg  Tabs (Colesevelam Hcl) .... Take 2 Tablets By Mouth Three Times A Day 3)  Torsemide 20 Mg  Tabs (Torsemide)  .... Take 1 Tablet By Mouth Once A Day 4)  Ery-Tab 250 Mg Tbec (Erythromycin Base) .... Take One Tablet 30 Minutes Before Breakfast,lunch,dinner and At Bedtime. 5)  Zegerid Otc 20-1100 Mg Caps (Omeprazole-Sodium Bicarbonate) .... Take 1 Capsule By Mouth Two Times A Day 6)  Crestor 10 Mg Tabs (Rosuvastatin Calcium) .... Take 1 Tablet By Mouth Once A Day 7)  Miralax  Powd (Polyethylene Glycol 3350) .[name].. 1 Capful in Water Daily 8)  Stool Softener 100 Mg Caps (Docusate Sodium) .... Take One Capsule At Bedtime 9)  Mucinex Dm 30-600 Mg  Tb12 (Dextromethorphan-Guaifenesin) .[name].. 1-2 By Mouth Two Times A Day As Needed 10)  Tessalon 200 Mg  Caps (Benzonatate) .[name].. 1 Every 8 Hours As Needed 11)  Tramadol Hcl 50 Mg Tabs (Tramadol Hcl) .[name].. 1 Every 4 Hours As Needed 12)  Meclizine Hcl 25 Mg Tabs (Meclizine Hcl) .[name].. 1 Tablet By Mouth Every 8 Hours As Needed 13)  Duoneb 0.5-2.5 (3) Mg/42ml  Soln (Ipratropium-Albuterol) .... Inhale 1 Neb Via Hhn Every 4-6 Hours As Needed 14)  Lorazepam 1 Mg Tabs (Lorazepam) .[name].. 1 Two Times A Day As Needed 15)  Chlor-Trimeton 4 Mg Tabs (Chlorpheniramine Maleate) .... Take One Tablet By Mouth Every 6 Hours As Needed 16)  Nystatin 100000 Unit/ml Susp (Nystatin) .... Swish and Swallow 4ml By Mouth Three Times A Day X5days 17)  Hyomax-Sl 0.125 Mg Subl (Hyoscyamine Sulfate) .... 2 Tabs Sublingual Every 4 Hours As Needed For Pain 18)  Clotrimazole 10 Mg Lozg (Clotrimazole) .... One Four Time Daily As Needed For Mouth and Throat Sore  Current Medications (verified): 1)  Multivitamins   Tabs (Multiple Vitamin) .... Take 1 Tablet By Mouth Once A Day 2)  Welchol 625 Mg  Tabs (Colesevelam Hcl) .... Take 3 Tablets By Mouth Two Times A Day 3)  Brovana 15 Mcg/8ml Nebu (Arformoterol Tartrate) .[name].. 1 Neb Two Times A Day 4)  Budesonide 0.25 Mg/16ml Susp (Budesonide) .[name].. 1 Neb Two Times A Day 5)  Torsemide 20 Mg  Tabs (Torsemide) .... Take 1 Tablet By Mouth Once A Day 6)  Ery-Tab 250 Mg Tbec  (Erythromycin Base) .... Take One Tablet 30 Minutes Before Breakfast,lunch,dinner and At Bedtime. 7)  Zegerid Otc 20-1100 Mg Caps (Omeprazole-Sodium Bicarbonate) .... Take 1 Capsule By Mouth Two Times A Day 8)  Crestor 10 Mg Tabs (Rosuvastatin Calcium) .... Take 1 Tablet By Mouth Once A Day 9)  Miralax  Powd (Polyethylene Glycol 3350) .[name].. 1 Capful in Water Daily 10)  Stool Softener 100 Mg Caps (Docusate Sodium) .... Take One Capsule At Bedtime 11)  Mucinex Dm 30-600 Mg  Tb12 (Dextromethorphan-Guaifenesin) .Marland KitchenMarland KitchenMarland Kitchen  1-2 By Mouth Two Times A Day As Needed 12)  Tessalon 200 Mg  Caps (Benzonatate) .[name].. 1 Every 8 Hours As Needed 13)  Tramadol Hcl 50 Mg Tabs (Tramadol Hcl) .[name].. 1 Every 4 Hours As Needed 14)  Meclizine Hcl 25 Mg Tabs (Meclizine Hcl) .[name].. 1 Tablet By Mouth Every 8 Hours As Needed 15)  Duoneb 0.5-2.5 (3) Mg/23ml  Soln (Ipratropium-Albuterol) .... Inhale 1 Neb Via Hhn Every 4-6 Hours As Needed 16)  Combivent 18-103 Mcg/act Aero (Ipratropium-Albuterol) .... 2 Puffs Every 4-6 Hours As Needed 17)  Lorazepam 1 Mg Tabs (Lorazepam) .... Take 1 Tablet By Mouth Every 12 Hours As Needed 18)  Chlor-Trimeton 4 Mg Tabs (Chlorpheniramine Maleate) .[name].. 1 Every 4-6 Hours As Needed 19)  Hyomax-Sl 0.125 Mg Subl (Hyoscyamine Sulfate) .... 2 Tabs Sublingual Every 4 Hours As Needed For Pain 20)  Atrovent 0.06 % Soln (Ipratropium Bromide) .... 2 Sprays Each Nostril Every 6 Hours As Needed 21)  Clotrimazole 10 Mg Lozg (Clotrimazole) .... One Four Time Daily As Needed For Mouth and Throat Sore  Allergies: 1)  ! Asa 2)  ! Codeine 3)  ! Sulfa 4)  ! Morphine 5)  ! Pcn 6)  ! Reglan (Metoclopramide Hcl)  Past History:  Past Medical History: Last updated: 10/19/2009 COUGH (ICD-786.2) onset about 1999    - Flared off Reglan April 30, 2008     - Changed Reglan > erythromcyin per Arlyce Dice 08/2008    - Add back reglan December 26, 2008     - Sinus ct 03/17/09 nl  Hyperlipidemia Anxiety Chronic Asthma,  severe..............................................................................Marland KitchenWert   - FEV1 29%, ratio 43% with 16% improvement after bronchodilators 11/02/2007   - FEV1 33%, ratio 47,  with 14% improvment after B2 July 13, 2009 with DLC0 59> 125% corrected    - [facility] July 13, 2009 50 %>  100% August 13, 2009 > verified Oct 19, 2009   Complex med regimen-  med calendar updated May 22, 2008 , .January 12, 2009  Past Surgical History: Last updated: 09/08/2008 Appendectomy Tubal Ligation Cystic surgery on breast-bilateral Lt. Ankle Surgery  Family History: Last updated: 2008-09-10 mother died at 37 from heart attack father died from heart attack sister 1 died from cirrhosis of liver and subsequent staff infection sister 2 is a diabetic sister 3 has hx of heart attack and stents brother has hx of heart disease and diabetes No FH of Colon Cancer: Family History of Breast Cancer:mother Family History of Ovarian Cancer: Sister x 2 Family History of Colon Polyps: Mother  Social History: Last updated: 08/06/2008 she has never smoked was previously exposed to her husband's cigarettes occasionally exercises no caffeine no etoh married 2 children Illicit Drug Use - no  Risk Factors: Smoking Status: never (03/11/2008)  Review of Systems      See HPI  Vital Signs:  Patient profile:   70 year old female Height:      66 inches Weight:      181 pounds BMI:     29.32 O2 Sat:      94 % on Room air Temp:     98.2 degrees F oral Pulse rate:   80 / minute BP sitting:   126 / 94  (left arm) Cuff size:   regular  Vitals Entered By: Boone Master CNA/MA (December 01, 2009 11:41 AM)  O2 Flow:  Room air CC: 1 week follow up for thrush and trial off dulera.  having prod cough with clear  mucus, wheezing and increased SOB since stopping the dulera Is Patient Diabetic? No Comments Medications reviewed with patient Daytime contact number verified with patient. Boone Master  CNA/MA  December 01, 2009 11:41 AM    Physical Exam  Additional Exam:  wt  193 September 22, 2008 >>191 Nov 03, 2008 > 188 January 12, 2009 > 191 March 13, 2009 > 189 April 02, 2009 > 187  May 11, 2009 > 187 July 13, 2009  > 183 August 13, 2009 >>183 September 07, 2009 > 182 Oct 19, 2009 >>181 12/01/09 HEENT mild turbinate edema.  Oropharynx no thrush or excess pnd or cobblestoning.  No JVD or cervical adenopathy. Mild accessory muscle hypertrophy. Trachea midline, nl thryroid. Chest decreased BS in base, no wheezing,  Regular rate and rhythm without murmur gallop or rub or increase P2 or edema.  Abd: no hsm, nl excursion. Ext warm without cyanosis or clubbing.     Impression & Recommendations:  Problem # 1:  COPD UNSPECIFIED (ICD-496) Will change dulera to budesonide/brovana neb two times a day , will still brush/rinse/gargle after use.   follow up Dr. Sherene Sires in 3 weeks.   Problem # 2:  CANDIDIASIS, ORAL (ICD-112.0)  none noted on exam, rec finish mycelex.   Orders: Est. Patient Level IV (16109)  Medications Added to Medication List This Visit: 1)  Welchol 625 Mg Tabs (Colesevelam hcl) .... Take 3 tablets by mouth two times a day 2)  Brovana 15 Mcg/54ml Nebu (Arformoterol tartrate) .[name].. 1 neb two times a day 3)  Budesonide 0.25 Mg/82ml Susp (Budesonide) .[name].. 1 neb two times a day 4)  Combivent 18-103 Mcg/act Aero (Ipratropium-albuterol) .... 2 puffs every 4-6 hours as needed 5)  Lorazepam 1 Mg Tabs (Lorazepam) .... Take 1 tablet by mouth every 12 hours as needed 6)  Chlor-trimeton 4 Mg Tabs (Chlorpheniramine maleate) .[name].. 1 every 4-6 hours as needed 7)  Atrovent 0.06 % Soln (Ipratropium bromide) .... 2 sprays each nostril every 6 hours as needed  Complete Medication List: 1)  Multivitamins Tabs (Multiple vitamin) .... Take 1 tablet by mouth once a day 2)  Welchol 625 Mg Tabs (Colesevelam hcl) .... Take 3 tablets by mouth two times a day 3)  Brovana 15 Mcg/68ml Nebu (Arformoterol  tartrate) .[name].. 1 neb two times a day 4)  Budesonide 0.25 Mg/62ml Susp (Budesonide) .[name].. 1 neb two times a day 5)  Torsemide 20 Mg Tabs (Torsemide) .... Take 1 tablet by mouth once a day 6)  Ery-tab 250 Mg Tbec (Erythromycin base) .... Take one tablet 30 minutes before breakfast,lunch,dinner and at bedtime. 7)  Zegerid Otc 20-1100 Mg Caps (Omeprazole-sodium bicarbonate) .... Take 1 capsule by mouth two times a day 8)  Crestor 10 Mg Tabs (Rosuvastatin calcium) .... Take 1 tablet by mouth once a day 9)  Miralax Powd (Polyethylene glycol 3350) .[name].. 1 capful in water daily 10)  Stool Softener 100 Mg Caps (Docusate sodium) .... Take one capsule at bedtime 11)  Mucinex Dm 30-600 Mg Tb12 (Dextromethorphan-guaifenesin) .[name].. 1-2 by mouth two times a day as needed 12)  Tessalon 200 Mg Caps (Benzonatate) .[name].. 1 every 8 hours as needed 13)  Tramadol Hcl 50 Mg Tabs (Tramadol hcl) .[name].. 1 every 4 hours as needed 14)  Meclizine Hcl 25 Mg Tabs (Meclizine hcl) .[name].. 1 tablet by mouth every 8 hours as needed 15)  Duoneb 0.5-2.5 (3) Mg/52ml Soln (Ipratropium-albuterol) .... Inhale 1 neb via hhn every 4-6 hours as needed 16)  Combivent  18-103 Mcg/act Aero (Ipratropium-albuterol) .... 2 puffs every 4-6 hours as needed 17)  Lorazepam 1 Mg Tabs (Lorazepam) .... Take 1 tablet by mouth every 12 hours as needed 18)  Chlor-trimeton 4 Mg Tabs (Chlorpheniramine maleate) .[name].. 1 every 4-6 hours as needed 19)  Hyomax-sl 0.125 Mg Subl (Hyoscyamine sulfate) .... 2 tabs sublingual every 4 hours as needed for pain 20)  Atrovent 0.06 % Soln (Ipratropium bromide) .... 2 sprays each nostril every 6 hours as needed 21)  Clotrimazole 10 Mg Lozg (Clotrimazole) .... One four time daily as needed for mouth and throat sore  Other Orders: DME Referral (DME)  Patient Instructions: 1)  Finish Mycelex troches.  2)  Stop Dulera 3)  Begin Brovana and Budesonide Neb two times a day --brush/rinse/gargle after use.  4)  follow up Dr. Sherene Sires in 3-4  weeks 5)  Please contact office for sooner follow up if symptoms do not improve or worsen  Prescriptions: BUDESONIDE 0.25 MG/2ML SUSP (BUDESONIDE) 1 neb two times a day  #60 x 0   Entered and Authorized by:   Rubye Oaks NP   Signed by:   Tammy Parrett NP on 12/01/2009   Method used:   Print then Give to Patient   RxID:   [id number] BROVANA 15 MCG/2ML NEBU (ARFORMOTEROL TARTRATE) 1 neb two times a day  #60 x 11   Entered and Authorized by:   Rubye Oaks NP   Signed by:   Tammy Parrett NP on 12/01/2009   Method used:   Print then Give to Patient   RxID:   [id number]    Immunization History:  Influenza Immunization History:    Influenza:  historical (03/20/2009)  Pneumovax Immunization History:    Pneumovax:  historical (03/20/2009)

## 2010-07-20 NOTE — Assessment & Plan Note (Signed)
Summary: NP follow up -  med calendar    Primary Provider/Referring Provider:  Maurice Small, MD  CC:  est med  calendar - pt brought all meds with her today.  still having prod cough with clear, DOE, and some wheezing.  finished omnicef from last ov.  History of Present Illness: 70  yowf never smoker c/o  cough since 2000  day > night, dry > wet  and exacerbated during times when she has URI.  Former workup  included allergy testing that showed only mild positivity and no response immunotherapy  1990s by Dr. Corinda Gubler,  negative FOB  in 2004, and only controlled as inpt with outpt nonadherence documented repeatedly.  08/02/07 c/o 3 months worsening dyspnea  after exac of cough stopped her from exercising.   when asked  if anything it ever help the cough she said no.  In fact the record indicates complete resolution during hospitalization 2004.  Seen 12/11/07  50% improvement, given prednisone taper and started on Symbicort 80/4.63mcg 2 pufs two times a day.    01/01/08 cough completely resolved but still doe x anything more than slow adl's.  overall though admits these she is the best she's been in years  . Increased symbicort to 160/4.5 2 two times a day   01/29/08--ov  added zyrtec at hs as needed for pnds pos response  March 11, 2008 increased sob but only used neb 3 x in last month, no noct co's, no overt hb. Rec try off reglan to reduce risk of longterm side effects-  April 30, 2008 ov :  worse x 2 weeks with increase dyspnea > cough, minimally productive. 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 C/o  1 week of productive cough, that is progressively worsening over last few days. Did not bring meds as requested in writing.   cont >>> December 26, 2008 confused with meds again, not using prns aggressively. thinks nerves are the issue. cough  same as always, dry daytime barking quality.  --Rx-steroid taper and restarted on reglan  January 12, 2009  ov:  med review. Improved from last visit. Cough is much better. Is taking reglan four times a day , using cough meds as needed. Less use of combivent.  May 11, 2009 ov rhinitis  no better with astepro, much better with atrovent.  Cough resolved. Still doe but not using rescue appropriately.  July 13, 2009 Followup with PFT's.  Pt c/o increased SOB with exertion x 2-3 wks.  She also c/o still having a dry cough.  She has noticed some wheezing when she goes outside.  rec change  to dulera  200 2 puffs first thing  in am and 2 puffs again in pm about 12 hours later   August 13, 2009 Acute visit.  pt c/o cough x 1 wk- prod with white sputum.  She also c/o increased SOB and wheezing.  Was seen by physician in [place] and given zpack and depomedrol.  using med calendar more consistently now to self manage.     September 07, 2009-- med review. last visit w/ flare tx w/ steroids. IMproved back to baseline.  rec d/c reglan due to shaking  Oct 19, 2009 6 wk followup.  Pt denies any complaints today.  States that her breathing has been okay.  No cough   December 01, 2009--Presents for  follow up for thrush and trial off dulera.  Last week called in w/ thrush. She was given mycelex. Has 2 days left. Mouth/tongue are still sore. She was recommended to stop dulera.   cont>> January 08, 2010 ov 1 month followup.  Pt c/o increased SOB since more humid weather.  She states she is "in and out" all day running errands and doing yardwork and gets out of breath easily.  She also c/o frequent need to clear throat and has some prod cough with white sputum. brought meds and med calendar with multiple inconsistencies.  can't tolerate dulera, not doing as well on symbicort in terms of symptom control.  no noct or early am flares  --Changed back to budesonide/brovana neb.   January 21, 2010--Presents for follow up and med review. She was changed back to Budesonide and Brovana last visit. She is feeling better -best she has been in a while. She has brought all her meds. We organized them and updated her med calendar. She got assistance with her neb meds and now can afford them.  She is feeling some better, has been under alot of stress w/ recent death of son and daughter is sick alot w/  skin condition.   on clindamycin and erthromycin too > Thrush on neb > no better with magic mouthwash > started dlflucan  April 05, 2010 ov Runny nose, prod cough with white sputum x 1 wk.   Not following med calendar list of contingencies.  overall doing much better x for upper resp symptoms.  Pt denies any significant sore throat, dysphagia, itching, sneezing,  nasal congestion or excess / purulent secretions,  fever, chills, sweats, unintended wt loss, pleuritic or exertional cp, hempoptysis, change in activity tolerance  orthopnea pnd or leg swelling.  Thinks this is just her fall allergies acting up the way they always do.    April 26, 2010 --Presents for an acute office visit. Complains of productive cough thick white,  and yellow mucus. Worse for last 10 days. She was seen 3 weeks ago and tx w/ steroids with improvement in cough-but never 100% cough free. She does have nasal drip, drainage intermittently. No overt reflux. Cough is getting worse and mucus is starting to change colors. She is leaving soon for vacation and is concerned she is getting worse. Previous CT scan 2008 and 2010 w/ no acute process, stable scattered lung nodules. She is a never smoker (hx 2nd hand smoke  exposure).  Denies chest pain,  orthopnea, hemoptysis, fever, n/v/d, edema, headache,recent travel. She is using tramadol on aver 2x day to help with cough, also using chlor tabs 4mg  two times a day for drippy nose.   May 24, 2010 --Returns for follow up and med  review. She was tx for bronchitis last ov w/ omnicef. Feeling better but still has some residual cough. We reveiwed her meds and organized her med calendar. Denies chest pain,  orthopnea, hemoptysis, fever, n/v/d, edema, headache.   Medications Prior to Update: 1)  Multivitamins   Tabs (Multiple Vitamin) .... Take 1 Tablet By Mouth Once A Day 2)  Evista 60 Mg Tabs (Raloxifene Hcl) .[name].. 1 Tablet Every Third Day 3)  Budesonide 0.25 Mg/73ml Susp (Budesonide) .[name].. 1 Vial in Nebulizer Two Times A Day 4)  Brovana 15 Mcg/55ml  Nebu (Arformoterol Tartrate) .[name].. 1 Vial in Nebulizer Two Times A Day 5)  Torsemide 20 Mg  Tabs (Torsemide) .... Take 1 Tablet By Mouth Once A Day 6)  Ery-Tab 250 Mg Tbec (Erythromycin Base) .... Take One Tablet 30 Minutes Before Breakfast,lunch,dinner and At Bedtime. 7)  Zegerid Otc 20-1100 Mg Caps (Omeprazole-Sodium Bicarbonate) .... Take 1 Capsule By Mouth Two Times A Day 8)  Crestor 10 Mg Tabs (Rosuvastatin Calcium) .... Take 1 Tablet By Mouth Once A Day 9)  Mucinex Dm 30-600 Mg  Tb12 (Dextromethorphan-Guaifenesin) .[name].. 1-2 By Mouth Two Times A Day As Needed 10)  Tramadol Hcl 50 Mg Tabs (Tramadol Hcl) .[name].. 1 Every 4 Hours As Needed 11)  Milk of Magnesia 7.75 % Susp (Magnesium Hydroxide) .... Per Bottle Directions As Needed 12)  Meclizine Hcl 25 Mg Tabs (Meclizine Hcl) .[name].. 1 Tablet By Mouth Every 8 Hours As Needed 13)  Combivent 18-103 Mcg/act Aero (Ipratropium-Albuterol) .... 2 Puffs Every 4-6 Hours As Needed 14)  Lorazepam 1 Mg Tabs (Lorazepam) .... Take 1 Tablet By Mouth Every 12 Hours As Needed 15)  Chlor-Trimeton 4 Mg Tabs (Chlorpheniramine Maleate) .[name].. 1 Every 4-6 Hours As Needed 16)  Hyomax-Sl 0.125 Mg Subl (Hyoscyamine Sulfate) .... 2 Tabs Sublingual Every 4 Hours As Needed For Pain 17)  Magic Mouth Wash .... 1 Tsp Every 5 Hrs 18)  Fluconazole 100 Mg Tabs (Fluconazole) .[name].. 1 Once Daily As Needed 19)  Magic Mouthwash .... 1 Tsp Every 5hrs As Needed 20)  Cefdinir 300 Mg  Caps (Cefdinir) .[name].. 1 By Mouth Two Times A Day 21)  Stool Softener 100 Mg Caps (Docusate Sodium) .... Take One Capsule At Bedtime  Current Medications (verified): 1)  Multivitamins   Tabs (Multiple Vitamin) .... Take 1 Tablet By Mouth Once A Day 2)  Evista 60 Mg Tabs (Raloxifene Hcl) .... Take 1 Tablet By Mouth Once A Day 3)  Budesonide 0.25 Mg/81ml Susp (Budesonide) .[name].. 1 Vial in Nebulizer Two Times A Day 4)  Brovana 15 Mcg/71ml Nebu (Arformoterol Tartrate) .[name].. 1 Vial in Nebulizer Two Times A Day 5)  Torsemide 20 Mg  Tabs (Torsemide) .... Take 1 Tablet By Mouth Once A Day 6)  Ery-Tab 250 Mg Tbec (Erythromycin Base) .... Take One Tablet 30 Minutes Before Breakfast,lunch,dinner and At Bedtime. 7)  Zegerid Otc 20-1100 Mg Caps (Omeprazole-Sodium Bicarbonate) .... Take 1 Capsule By Mouth Before First and Last Meals of The Day 8)  Crestor 10 Mg Tabs (Rosuvastatin Calcium) .... Take 1 Tab By Mouth At Bedtime 9)  Mucinex Dm 30-600 Mg  Tb12 (Dextromethorphan-Guaifenesin) .[name].. 1-2 By Mouth Two Times A Day As Needed W/ Flutter Valve 10)  Tramadol Hcl 50 Mg Tabs (Tramadol Hcl) .[name].. 1 Every 4 Hours As Needed 11)  Milk of Magnesia 7.75 % Susp (Magnesium Hydroxide) .... Per Bottle Directions As Needed 12)  Meclizine Hcl 25 Mg Tabs (Meclizine Hcl) .[name].. 1 Tablet By Mouth Every 8 Hours As Needed 13)  Combivent 18-103 Mcg/act Aero (Ipratropium-Albuterol) .... 2 Puffs Every 4-6 Hours As Needed 14)  Lorazepam 1 Mg Tabs (Lorazepam) .... Take 1 Tablet By Mouth Every 12 Hours As Needed 15)  Chlor-Trimeton 4 Mg Tabs (Chlorpheniramine Maleate) .[name].. 1 Every 4-6 Hours As Needed 16)  Hyomax-Sl 0.125 Mg Subl (Hyoscyamine Sulfate) .... 2 Tabs Sublingual Every 4 Hours As Needed For Pain 17)  First-[employer]ukes Mouthwash  Susp (Diphenhyd-Hydrocort-Nystatin) .[name].. 1 Teaspoon Every 6 Hours As Needed 18)  Atrovent 0.06 % Soln (Ipratropium Bromide) .... 2 Sprays  Each Nostril Every 6 Hours As Needed For Nasal Drip  Allergies  (verified): 1)  ! Asa 2)  ! Codeine 3)  ! Sulfa 4)  ! Morphine 5)  ! Pcn 6)  ! Reglan (Metoclopramide Hcl)  Past History:  Past Surgical History: Last updated: 09/08/2008 Appendectomy Tubal Ligation Cystic surgery on breast-bilateral Lt. Ankle Surgery  Family History: Last updated: September 03, 2008 mother died at 63 from heart attack father died from heart attack sister 1 died from cirrhosis of liver and subsequent staff infection sister 2 is a diabetic sister 3 has hx of heart attack and stents brother has hx of heart disease and diabetes No FH of Colon Cancer: Family History of Breast Cancer:mother Family History of Ovarian Cancer: Sister x 2 Family History of Colon Polyps: Mother  Social History: Last updated: 08/06/2008 she has never smoked was previously exposed to her husband's cigarettes occasionally exercises no caffeine no etoh married 2 children Illicit Drug Use - no  Risk Factors: Smoking Status: never (04/26/2010)  Past Medical History: COUGH (ICD-786.2) onset about 1999    - Flared off Reglan April 30, 2008     - Changed Reglan > erythromcyin per Arlyce Dice 08/2008    - Add back reglan December 26, 2008 >>reglan stopped intolerant    - Sinus ct 03/17/09 nl -CT chest 11/08 scattered pulm nodules, 5/10 stable pulm nodules (never smoker)  Hyperlipidemia Anxiety Chronic Asthma, severe..............................................................................Marland KitchenWert   - FEV1 29%, ratio 43% with 16% improvement after bronchodilators 11/02/2007   - FEV1 33%, ratio 47,  with 14% improvment after B2 July 13, 2009 with DLC0 59> 125% corrected    - [facility] July 13, 2009 50 %>  100% August 13, 2009 > 75% January 08, 2010  Complex med regimen-  med calendar updated May 22, 2008 , .January 12, 2009, January 21, 2010 , 05/24/10    - Poor correlation between calendar and bags of meds January 08, 2010   Review of Systems      See HPI  Vital Signs:  Patient profile:   70  year old female Height:      66 inches Weight:      173.25 pounds BMI:     28.06 O2 Sat:      96 % on Room air Temp:     98.0 degrees F oral Pulse rate:   86 / minute BP sitting:   144 / 84  (right arm) Cuff size:   regular  Vitals Entered By: Boone Master CNA/MA (May 24, 2010 10:21 AM)  O2 Flow:  Room air CC: est med  calendar - pt brought all meds with her today.  still having prod cough with clear, DOE, some wheezing.  finished omnicef from last ov Is Patient Diabetic? No Comments Medications reviewed with patient Daytime contact number verified with patient. Boone Master CNA/MA  May 24, 2010 10:21 AM    Physical Exam  Additional Exam:  wt  193 September 22, 2008 >  181 12/01/09 > 179 January 09, 2010 >>177 August  , 2011 > 174 April 05, 2010 >>174>>173 12/5 HEENT mild nonspecific  turbinate edema.  Oropharynx no thrush or excess pnd or cobblestoning.  No JVD or cervical adenopathy. Mild accessory muscle hypertrophy. Trachea midline, nl thryroid. Chest decreased BS in base, no wheezing,  Regular rate and rhythm without murmur gallop or rub or increase P2 or edema.  Abd: no hsm, nl excursion. Ext warm without cyanosis or clubbing.  Impression & Recommendations:  Problem # 1:  ASTHMATIC BRONCHITIS, ACUTE (ICD-466.0)  Resolving.  Meds reviewed with pt education and computerized med calendar completed/adjusted.   cont on present reimgen.  The following medications were removed from the medication list:    Cefdinir 300 Mg Caps (Cefdinir) .[name]... 1 by mouth two times a day Her updated medication list for this problem includes:    Budesonide 0.25 Mg/68ml Susp (Budesonide) .[name]... 1 vial in nebulizer two times a day    Brovana 15 Mcg/65ml Nebu (Arformoterol tartrate) .[name]... 1 vial in nebulizer two times a day    Ery-tab 250 Mg Tbec (Erythromycin base) .[name]... Take one tablet 30 minutes before breakfast,lunch,dinner and at bedtime.    Mucinex Dm 30-600 Mg Tb12  (Dextromethorphan-guaifenesin) .[name]... 1-2 by mouth two times a day as needed w/ flutter valve    Combivent 18-103 Mcg/act Aero (Ipratropium-albuterol) .[name]... 2 puffs every 4-6 hours as needed  Orders: Est. Patient Level III (09811)  Medications Added to Medication List This Visit: 1)  Evista 60 Mg Tabs (Raloxifene hcl) .... Take 1 tablet by mouth once a day 2)  Zegerid Otc 20-1100 Mg Caps (Omeprazole-sodium bicarbonate) .... Take 1 capsule by mouth before first and last meals of the day 3)  Crestor 10 Mg Tabs (Rosuvastatin calcium) .... Take 1 tab by mouth at bedtime 4)  Mucinex Dm 30-600 Mg Tb12 (Dextromethorphan-guaifenesin) .[name].. 1-2 by mouth two times a day as needed w/ flutter valve 5)  First-dukes Mouthwash Susp (Diphenhyd-hydrocort-nystatin) .[name].. 1 teaspoon every 6 hours as needed 6)  Atrovent 0.06 % Soln (Ipratropium bromide) .... 2 sprays each nostril every 6 hours as needed for nasal drip  Complete Medication List: 1)  Multivitamins Tabs (Multiple vitamin) .... Take 1 tablet by mouth once a day 2)  Evista 60 Mg Tabs (Raloxifene hcl) .... Take 1 tablet by mouth once a day 3)  Budesonide 0.25 Mg/95ml Susp (Budesonide) .[name].. 1 vial in nebulizer two times a day 4)  Brovana 15 Mcg/28ml Nebu (Arformoterol tartrate) .[name].. 1 vial in nebulizer two times a day 5)  Torsemide 20 Mg Tabs (Torsemide) .... Take 1 tablet by mouth once a day 6)  Ery-tab 250 Mg Tbec (Erythromycin base) .... Take one tablet 30 minutes before breakfast,lunch,dinner and at bedtime. 7)  Zegerid Otc 20-1100 Mg Caps (Omeprazole-sodium bicarbonate) .... Take 1 capsule by mouth before first and last meals of the day 8)  Crestor 10 Mg Tabs (Rosuvastatin calcium) .... Take 1 tab by mouth at bedtime 9)  Mucinex Dm 30-600 Mg Tb12 (Dextromethorphan-guaifenesin) .[name].. 1-2 by mouth two times a day as needed w/ flutter valve 10)  Tramadol Hcl 50 Mg Tabs (Tramadol hcl) .[name].. 1 every 4 hours as needed 11)  Milk of Magnesia  7.75 % Susp (Magnesium hydroxide) .... Per bottle directions as needed 12)  Meclizine Hcl 25 Mg Tabs (Meclizine hcl) .[name].. 1 tablet by mouth every 8 hours as needed 13)  Combivent 18-103 Mcg/act Aero (Ipratropium-albuterol) .... 2 puffs every 4-6 hours as needed 14)  Lorazepam 1 Mg Tabs (Lorazepam) .... Take 1 tablet by mouth every 12 hours as needed 15)  Chlor-trimeton 4 Mg Tabs (Chlorpheniramine maleate) .[name].. 1 every 4-6 hours as needed 16)  Hyomax-sl 0.125 Mg Subl (Hyoscyamine sulfate) .... 2 tabs sublingual every 4 hours as needed for pain 17)  First-dukes Mouthwash Susp (Diphenhyd-hydrocort-nystatin) .[name].. 1 teaspoon every 6 hours as needed 18)  Atrovent 0.06 % Soln (Ipratropium bromide) .... 2 sprays each nostril every 6  hours as needed for nasal drip  Patient Instructions: 1)  Follow med  calendar closely and bring to each visit.  2)  follow up Dr. Sherene Sires in 2 months and as needed  3)  MERRY CHRISTMAS AND HAPPY NEW YEAR.

## 2010-07-20 NOTE — Assessment & Plan Note (Signed)
Summary: NP follow up - med calendar   Primary Sarayah Bacchi/Referring Tanaya Dunigan:  Maurice Small, MD  CC:  est med calendar - pt brought all meds with her today.  no new complaints..  History of Present Illness:  70  yowf never smoker c/o  cough since 2000  day > night, dry > wet  and exacerbated during times when she has URI.  Former workup  included allergy testing that showed only mild positivity and no response immunotherapy  1990s by Dr. Corinda Gubler,  negative FOB  in 2004, and only controlled as inpt with outpt nonadherence documented repeatedly.  08/02/07 c/o 3 months worsening dyspnea  after exac of cough stopped her from exercising.   when asked  if anything it ever help the cough she said no.  In fact the record indicates complete resolution during hospitalization 2004.  Seen 12/11/07  69% improvement, given prednisone taper and started on Symbicort 80/4.61mcg 2 pufs two times a day.    01/01/08 cough completely resolved but still doe x anything more than slow adl's.  overall though admits these she is the best she's been in years  . Increased symbicort to 160/4.5 2 two times a day   01/29/08--ov  added zyrtec at hs as needed for pnds pos response  March 11, 2008 increased sob but only used neb 3 x in last month, no noct co's, no overt hb. Rec try off reglan to reduce risk of longterm side effects-  April 30, 2008 ov :  worse x 2 weeks with increase dyspnea > cough, minimally productive. 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 C/o  1 week of productive cough, that is progressively worsening over last few days. Did not bring meds as requested in writing.   December 26, 2008 confused with meds again, not using prns aggressively. thinks nerves are the  issue. cough  same as always, dry daytime barking quality.  --Rx-steroid taper and restarted on reglan  January 12, 2009  ov:  med review. Improved from last visit. Cough is much better. Is taking reglan four times a day , using cough meds as needed. Less use of combivent.  May 11, 2009 ov rhinitis  no better with astepro, much better with atrovent.  Cough resolved. Still doe but not using rescue appropriately.  July 13, 2009 Followup with PFT's.  Pt c/o increased SOB with exertion x 2-3 wks.  She also c/o still having a dry cough.  She has noticed some wheezing when she goes outside.  rec change  to dulera  200 2 puffs first thing  in am and 2 puffs again in pm about 12 hours later   August 13, 2009 Acute visit.  pt c/o cough x 1 wk- prod with white sputum.  She also c/o increased SOB and wheezing.  Was seen by physician in [place] and given zpack and depomedrol.  using med calendar more consistently now to self manage.     September 07, 2009-- med review. last visit w/ flare tx w/ steroids. IMproved back to baseline.  rec d/c reglan due to shaking  Oct 19, 2009 6 wk followup.  Pt denies any complaints today.  States that  her breathing has been okay.  No cough   December 01, 2009--Presents for  follow up for thrush and trial off dulera.  Last week called in w/ thrush. She was given mycelex. Has 2 days left. Mouth/tongue are still sore. She was recommended to stop dulera.  January 08, 2010 ov 1 month followup.  Pt c/o increased SOB since more humid weather.  She states she is "in and out" all day running errands and doing yardwork and gets out of breath easily.  She also c/o frequent need to clear throat and has some prod cough with white sputum. brought meds and med calendar with multiple inconsistencies.  can't tolerate dulera, not doing as well on symbicort in terms of symptom control.  no noct or early am flares --Changed back to budesonide/brovana neb.   January 21, 2010--Presents for follow up and med  review. She was changed back to Budesonide and Brovana last visit. She is feeling better -best she has been in a while. She has brought all her meds. We organized them and updated her med calendar. She got assistance with her neb meds and now can afford them.  She is feeling some better, has been under alot of stress w/ recent death of son and daughter is sick alot w/  skin condition. Denies chest pain,  orthopnea, hemoptysis, fever, n/v/d, edema, headache.   Medications Prior to Update: 1)  Multivitamins   Tabs (Multiple Vitamin) .... Take 1 Tablet By Mouth Once A Day 2)  Welchol 625 Mg  Tabs (Colesevelam Hcl) .... Take 3 Tablets By Mouth Two Times A Day 3)  Torsemide 20 Mg  Tabs (Torsemide) .... Take 1 Tablet By Mouth Once A Day 4)  Ery-Tab 250 Mg Tbec (Erythromycin Base) .... Take One Tablet 30 Minutes Before Breakfast,lunch,dinner and At Bedtime. 5)  Zegerid Otc 20-1100 Mg Caps (Omeprazole-Sodium Bicarbonate) .... Take 1 Capsule By Mouth Two Times A Day 6)  Crestor 10 Mg Tabs (Rosuvastatin Calcium) .... Take 1 Tablet By Mouth Once A Day 7)  Miralax  Powd (Polyethylene Glycol 3350) .[name].. 1 Capful in Water Daily 8)  Stool Softener 100 Mg Caps (Docusate Sodium) .... Take One Capsule At Bedtime 9)  Symbicort 160-4.5 Mcg/act Aero (Budesonide-Formoterol Fumarate) .... 2 Puffs Every 12 Hours 10)  Mucinex Dm 30-600 Mg  Tb12 (Dextromethorphan-Guaifenesin) .[name].. 1-2 By Mouth Two Times A Day As Needed 11)  Tessalon 200 Mg  Caps (Benzonatate) .[name].. 1 Every 8 Hours As Needed 12)  Tramadol Hcl 50 Mg Tabs (Tramadol Hcl) .[name].. 1 Every 4 Hours As Needed 13)  Meclizine Hcl 25 Mg Tabs (Meclizine Hcl) .[name].. 1 Tablet By Mouth Every 8 Hours As Needed 14)  Duoneb 0.5-2.5 (3) Mg/84ml  Soln (Ipratropium-Albuterol) .... Inhale 1 Neb Via Hhn Every 4-6 Hours As Needed 15)  Combivent 18-103 Mcg/act Aero (Ipratropium-Albuterol) .... 2 Puffs Every 4-6 Hours As Needed 16)  Lorazepam 1 Mg Tabs (Lorazepam) .... Take 1 Tablet By Mouth  Every 12 Hours As Needed 17)  Chlor-Trimeton 4 Mg Tabs (Chlorpheniramine Maleate) .[name].. 1 Every 4-6 Hours As Needed 18)  Hyomax-Sl 0.125 Mg Subl (Hyoscyamine Sulfate) .... 2 Tabs Sublingual Every 4 Hours As Needed For Pain 19)  Prednisone 10 Mg  Tabs (Prednisone) .... 4 Each Am X 2days, 2x2days, 1x2days and Stop 20)  Brovana 15 Mcg/64ml Nebu (Arformoterol Tartrate) .[name].. 1 Vial in Nebulizer Two Times A Day 21)  Budesonide 0.25 Mg/75ml Susp (Budesonide) .[name].. 1 Vial in Nebulizer Two Times A Day  Allergies (verified): 1)  ! Asa 2)  ! Codeine 3)  ! Sulfa 4)  ! Morphine 5)  ! Pcn 6)  ! Reglan (Metoclopramide Hcl)  Past History:  Past Surgical History: Last updated: 09/08/2008 Appendectomy Tubal Ligation Cystic surgery on breast-bilateral Lt. Ankle Surgery  Family History: Last updated: 08-21-08 mother died at 34 from heart attack father died from heart attack sister 1 died from cirrhosis of liver and subsequent staff infection sister 2 is a diabetic sister 3 has hx of heart attack and stents brother has hx of heart disease and diabetes No FH of Colon Cancer: Family History of Breast Cancer:mother Family History of Ovarian Cancer: Sister x 2 Family History of Colon Polyps: Mother  Social History: Last updated: 08/06/2008 she has never smoked was previously exposed to her husband's cigarettes occasionally exercises no caffeine no etoh married 2 children Illicit Drug Use - no  Risk Factors: Smoking Status: never (03/11/2008)  Past Medical History: COUGH (ICD-786.2) onset about 1999    - Flared off Reglan April 30, 2008     - Changed Reglan > erythromcyin per Arlyce Dice 08/2008    - Add back reglan December 26, 2008     - Sinus ct 03/17/09 nl  Hyperlipidemia Anxiety Chronic Asthma, severe..............................................................................Marland KitchenWert   - FEV1 29%, ratio 43% with 16% improvement after bronchodilators 11/02/2007   - FEV1 33%, ratio 47,  with  14% improvment after B2 July 13, 2009 with DLC0 59> 125% corrected    - [facility] July 13, 2009 50 %>  100% August 13, 2009 > 75% January 08, 2010  Complex med regimen-  med calendar updated May 22, 2008 , .January 12, 2009, January 21, 2010     - Poor correlation between calendar and bags of meds January 08, 2010   Review of Systems      See HPI  Vital Signs:  Patient profile:   70 year old female Height:      66 inches Weight:      177.13 pounds BMI:     28.69 O2 Sat:      95 % on Room air Temp:     98.9 degrees F oral Pulse rate:   88 / minute BP sitting:   134 / 80  (right arm) Cuff size:   regular  Vitals Entered By: Boone Master CNA/MA (January 21, 2010 2:03 PM)  O2 Flow:  Room air CC: est med calendar - pt brought all meds with her today.  no new complaints. Is Patient Diabetic? No Comments Medications reviewed with patient Daytime contact number verified with patient. Boone Master CNA/MA  January 21, 2010 2:03 PM    Physical Exam  Additional Exam:  wt  193 September 22, 2008 > 189 April 02, 2009  > 182 Oct 19, 2009 >>181 12/01/09 > 179 January 09, 2010 >>177 August  , 2011 HEENT mild turbinate edema.  Oropharynx no thrush or excess pnd or cobblestoning.  No JVD or cervical adenopathy. Mild accessory muscle hypertrophy. Trachea midline, nl thryroid. Chest decreased BS in base, no wheezing,  Regular rate and rhythm without murmur gallop or rub or increase P2 or edema.  Abd: no hsm, nl excursion. Ext warm without cyanosis or clubbing.     Impression & Recommendations:  Problem # 1:  INTRINSIC ASTHMA, UNSPECIFIED (ICD-493.10) Chronic asthma w/ improved control on present regimen Meds reviewed with pt education and computerized med calendar adjusted.  REC: ' You are doing great,  keep up the good work.  Follow med calendar closely and bring to each visit.  follow up Dr. Sherene Sires in 2 months  Please contact office for sooner follow up if symptoms do not improve or worsen    Complete Medication List: 1)  Multivitamins Tabs (Multiple vitamin) .... Take 1 tablet by mouth once a day 2)  Welchol 625 Mg Tabs (Colesevelam hcl) .... Take 3 tablets by mouth two times a day 3)  Torsemide 20 Mg Tabs (Torsemide) .... Take 1 tablet by mouth once a day 4)  Ery-tab 250 Mg Tbec (Erythromycin base) .... Take one tablet 30 minutes before breakfast,lunch,dinner and at bedtime. 5)  Zegerid Otc 20-1100 Mg Caps (Omeprazole-sodium bicarbonate) .... Take 1 capsule by mouth two times a day 6)  Crestor 10 Mg Tabs (Rosuvastatin calcium) .... Take 1 tablet by mouth once a day 7)  Miralax Powd (Polyethylene glycol 3350) .[name].. 1 capful in water daily 8)  Stool Softener 100 Mg Caps (Docusate sodium) .... Take one capsule at bedtime 9)  Symbicort 160-4.5 Mcg/act Aero (Budesonide-formoterol fumarate) .... 2 puffs every 12 hours 10)  Mucinex Dm 30-600 Mg Tb12 (Dextromethorphan-guaifenesin) .[name].. 1-2 by mouth two times a day as needed 11)  Tessalon 200 Mg Caps (Benzonatate) .[name].. 1 every 8 hours as needed 12)  Tramadol Hcl 50 Mg Tabs (Tramadol hcl) .[name].. 1 every 4 hours as needed 13)  Meclizine Hcl 25 Mg Tabs (Meclizine hcl) .[name].. 1 tablet by mouth every 8 hours as needed 14)  Duoneb 0.5-2.5 (3) Mg/61ml Soln (Ipratropium-albuterol) .... Inhale 1 neb via hhn every 4-6 hours as needed 15)  Combivent 18-103 Mcg/act Aero (Ipratropium-albuterol) .... 2 puffs every 4-6 hours as needed 16)  Lorazepam 1 Mg Tabs (Lorazepam) .... Take 1 tablet by mouth every 12 hours as needed 17)  Chlor-trimeton 4 Mg Tabs (Chlorpheniramine maleate) .[name].. 1 every 4-6 hours as needed 18)  Hyomax-sl 0.125 Mg Subl (Hyoscyamine sulfate) .... 2 tabs sublingual every 4 hours as needed for pain 19)  Prednisone 10 Mg Tabs (Prednisone) .... 4 each am x 2days, 2x2days, 1x2days and stop 20)  Brovana 15 Mcg/76ml Nebu (Arformoterol tartrate) .[name].. 1 vial in nebulizer two times a day 21)  Budesonide 0.25 Mg/8ml Susp (Budesonide) .[name].. 1 vial in  nebulizer two times a day  Other Orders: Est. Patient Level III (78295)  Patient Instructions: 1)  You are doing great, keep up the good work.  2)  Follow med calendar closely and bring to each visit.  3)  follow up Dr. Sherene Sires in 2 months  4)  Please contact office for sooner follow up if symptoms do not improve or worsen

## 2010-07-20 NOTE — Assessment & Plan Note (Signed)
Summary: Pulmonary/ ext f/u ov > try again change to neb b/b   Primary Provider/Referring Provider:  Maurice Small, MD  CC:  69 month followup.  Pt c/o increased SOB since more humid weather.  She states she is "in and out" all day running errands and doing yardwork and gets out of breath easily.  She also c/o frequent need to clear throat and has some prod cough with white sputum.[name]  History of Present Illness:  70  yowf never smoker c/o  cough since 2000  day > night, dry > wet  and exacerbated during times when she has URI.  Former workup  included allergy testing that showed only mild positivity and no response immunotherapy  1990s by Dr. Corinda Gubler,  negative FOB  in 2004, and only controlled as inpt with outpt nonadherence documented repeatedly.  08/02/07 c/o 3 months worsening dyspnea  after exac of cough stopped her from exercising.   when asked  if anything it ever help the cough she said no.  In fact the record indicates complete resolution during hospitalization 2004.  Seen 12/11/07  50% improvement, given prednisone taper and started on Symbicort 80/4.37mcg 2 pufs two times a day.    01/01/08 cough completely resolved but still doe x anything more than slow adl's.  overall though admits these she is the best she's been in years  . Increased symbicort to 160/4.5 2 two times a day   01/29/08--ov  added zyrtec at hs as needed for pnds pos response  March 11, 2008 increased sob but only used neb 3 x in last month, no noct co's, no overt hb. Rec try off reglan to reduce risk of longterm side effects-  April 30, 2008 ov :  worse x 2 weeks with increase dyspnea > cough, minimally productive. 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 C/o  1 week of  productive cough, that is progressively worsening over last few days. Did not bring meds as requested in writing.   December 26, 2008 confused with meds again, not using prns aggressively. thinks nerves are the issue. cough  same as always, dry daytime barking quality.  --Rx-steroid taper and restarted on reglan  January 12, 2009  ov:  med review. Improved from last visit. Cough is much better. Is taking reglan four times a day , using cough meds as needed. Less use of combivent.  May 11, 2009 ov rhinitis  no better with astepro, much better with atrovent.  Cough resolved. Still doe but not using rescue appropriately.  July 13, 2009 Followup with PFT's.  Pt c/o increased SOB with exertion x 2-3 wks.  She also c/o still having a dry cough.  She has noticed some wheezing when she goes outside.  rec change  to dulera  200 2 puffs first thing  in am and 2 puffs again in pm about 12 hours later   August 13, 2009 Acute visit.  pt c/o cough x 1 wk- prod with white sputum.  She also c/o increased SOB and wheezing.  Was seen by physician in [place] and given zpack and depomedrol.  using med calendar more consistently now to self manage.  September 07, 2009-- med review. last visit w/ flare tx w/ steroids. IMproved back to baseline.  rec d/c reglan due to shaking  Oct 19, 2009 6 wk followup.  Pt denies any complaints today.  States that her breathing has been okay.  No cough   December 01, 2009--Presents for  follow up for thrush and trial off dulera.  Last week called in w/ thrush. She was given mycelex. Has 2 days left. Mouth/tongue are still sore. She was recommended to stop dulera. January 08, 2010 ov 1 month followup.  Pt c/o increased SOB since more humid weather.  She states she is "in and out" all day running errands and doing yardwork and gets out of breath easily.  She also c/o frequent need to clear throat and has some prod cough with white sputum. brought meds and med calendar with multiple inconsistencies.   can't tolerate dulera, not doing as well on symbicort in terms of symptom control.  no noct or early am flares.  Pt denies any significant sore throat, dysphagia, itching, sneezing,  nasal congestion or excess secretions,  fever, chills, sweats, unintended wt loss, pleuritic or exertional cp, hempoptysis,  orthopnea pnd or leg swelling   Current Medications (verified): 1)  Multivitamins   Tabs (Multiple Vitamin) .... Take 1 Tablet By Mouth Once A Day 2)  Welchol 625 Mg  Tabs (Colesevelam Hcl) .... Take 3 Tablets By Mouth Two Times A Day 3)  Torsemide 20 Mg  Tabs (Torsemide) .... Take 1 Tablet By Mouth Once A Day 4)  Ery-Tab 250 Mg Tbec (Erythromycin Base) .... Take One Tablet 30 Minutes Before Breakfast,lunch,dinner and At Bedtime. 5)  Zegerid Otc 20-1100 Mg Caps (Omeprazole-Sodium Bicarbonate) .... Take 1 Capsule By Mouth Two Times A Day 6)  Crestor 10 Mg Tabs (Rosuvastatin Calcium) .... Take 1 Tablet By Mouth Once A Day 7)  Miralax  Powd (Polyethylene Glycol 3350) .[name].. 1 Capful in Water Daily 8)  Stool Softener 100 Mg Caps (Docusate Sodium) .... Take One Capsule At Bedtime 9)  Symbicort 160-4.5 Mcg/act Aero (Budesonide-Formoterol Fumarate) .... 2 Puffs Every 12 Hours 10)  Mucinex Dm 30-600 Mg  Tb12 (Dextromethorphan-Guaifenesin) .[name].. 1-2 By Mouth Two Times A Day As Needed 11)  Tessalon 200 Mg  Caps (Benzonatate) .[name].. 1 Every 8 Hours As Needed 12)  Tramadol Hcl 50 Mg Tabs (Tramadol Hcl) .[name].. 1 Every 4 Hours As Needed 13)  Meclizine Hcl 25 Mg Tabs (Meclizine Hcl) .[name].. 1 Tablet By Mouth Every 8 Hours As Needed 14)  Duoneb 0.5-2.5 (3) Mg/57ml  Soln (Ipratropium-Albuterol) .... Inhale 1 Neb Via Hhn Every 4-6 Hours As Needed 15)  Combivent 18-103 Mcg/act Aero (Ipratropium-Albuterol) .... 2 Puffs Every 4-6 Hours As Needed 16)  Lorazepam 1 Mg Tabs (Lorazepam) .... Take 1 Tablet By Mouth Every 12 Hours As Needed 17)  Chlor-Trimeton 4 Mg Tabs (Chlorpheniramine Maleate) .[name].. 1 Every 4-6 Hours As Needed 18)   Hyomax-Sl 0.125 Mg Subl (Hyoscyamine Sulfate) .... 2 Tabs Sublingual Every 4 Hours As Needed For Pain  Allergies (verified): 1)  ! Asa 2)  ! Codeine 3)  ! Sulfa 4)  ! Morphine 5)  ! Pcn 6)  ! Reglan (Metoclopramide Hcl)  Past History:  Past Medical History: COUGH (ICD-786.2) onset about 68    - Flared off Reglan April 30, 2008     - Changed Reglan > erythromcyin per Arlyce Dice 08/2008    - Add back reglan December 26, 2008     - Sinus  ct 03/17/09 nl  Hyperlipidemia Anxiety Chronic Asthma, severe..............................................................................Marland KitchenWert   - FEV1 29%, ratio 43% with 16% improvement after bronchodilators 11/02/2007   - FEV1 33%, ratio 47,  with 14% improvment after B2 July 13, 2009 with DLC0 59> 125% corrected    - [facility] July 13, 2009 50 %>  100% August 13, 2009 > 75% January 08, 2010  Complex med regimen-  med calendar updated May 22, 2008 , .January 12, 2009    - Poor correlation between calendar and bags of meds January 08, 2010   Vital Signs:  Patient profile:   70 year old female Weight:      179 pounds O2 Sat:      96 % on Room air Temp:     98.5 degrees F oral Pulse rate:   81 / minute BP sitting:   118 / 78  (left arm)  Vitals Entered By: Vernie Murders (January 08, 2010 1:59 PM)  O2 Flow:  Room air  Physical Exam  Additional Exam:  wt  193 September 22, 2008 > 189 April 02, 2009  > 182 Oct 19, 2009 >>181 12/01/09 > 179 January 09, 2010  HEENT mild turbinate edema.  Oropharynx no thrush or excess pnd or cobblestoning.  No JVD or cervical adenopathy. Mild accessory muscle hypertrophy. Trachea midline, nl thryroid. Chest decreased BS in base, no wheezing,  Regular rate and rhythm without murmur gallop or rub or increase P2 or edema.  Abd: no hsm, nl excursion. Ext warm without cyanosis or clubbing.     Impression & Recommendations:  Problem # 1:  ASTHMATIC BRONCHITIS, ACUTE (ICD-466.0)   DDX of  difficult airways managment all start  with A and  include Adherence, Ace Inhibitors, Acid Reflux, Active Sinus Disease, Alpha 1 Antitripsin deficiency, Anxiety masquerading as Airways dz,  ABPA,  allergy(esp in young), Aspiration (esp in elderly), Adverse effects of DPI,  Active smokers, plus one B  = Beta blocker use..    Adherence remains a major challenge despite aggressive efforts to solve it, there is still a poor understanding of maint vs prns.  To keep things simple, I have asked the patient to first separate medicines that are perceived as maintenance, that is to be taken daily "no matter what", from those medicines that are taken on only on an as-needed basis and I have given the patient examples of both, and then return to see our NP to generate a  new revised  medication calendar which should be followed until the next physician sees the patient and updates it.   Once we're sure that we're all reading from the same page in terms of medication admiistration, she needs to be scheduled to follow up with me   Acid reflux:  has no zergerid and has documented gastroparesis, rec refill and place the refills in the maint bag of meds  Anxiety probablematic also   Cost a major limiting factor, try again to get option : change to Brovana and Budesonide, the equivalent of Symbicort, per neb. per [facility] thru [facility] instead of her retail pharmacy  Medications Added to Medication List This Visit: 1)  Symbicort 160-4.5 Mcg/act Aero (Budesonide-formoterol fumarate) .... 2 puffs every 12 hours 2)  Tramadol Hcl 50 Mg Tabs (Tramadol hcl) .[name].. 1 every 4 hours as needed 3)  Prednisone 10 Mg Tabs (Prednisone) .... 4 each am x 2days, 2x2days, 1x2days and stop 4)  Brovana 15 Mcg/67ml Nebu (Arformoterol tartrate) .[name].. 1 vial in nebulizer  two times a day 5)  Budesonide 0.25 Mg/18ml Susp (Budesonide) .[name].. 1 vial in nebulizer two times a day  Other Orders: Misc. Referral (Misc. Ref) Est. Patient Level IV ([address]109)  Patient Instructions: 1)  See  calendar for specific medication instructions and bring it back for each and every office visit for every healthcare provider you see.  Without it,  you may not receive the best quality medical care that we feel you deserve. 2)  Prednisone 10 mg 4 each am x 2days, 2x2days, 1x2days and stop 3)  See Patient Care Coordinator before leaving for nebulizer through part B Medicare. 4)  See Tammy NP w/in 2 weeks with all your medications, even over the counter meds, separated in two separate bags, the ones you take no matter what vs the ones you stop once you feel better and take only as needed.  She will generate for you a new user friendly medication calendar that will put [place] all on the same page re: your medication use.  Prescriptions: BUDESONIDE 0.25 MG/2ML SUSP (BUDESONIDE) 1 vial in nebulizer two times a day  #50 x 11   Entered by:   Vernie Murders   Authorized by:   Nyoka Cowden MD   Signed by:   Vernie Murders on 01/08/2010   Method used:   Print then Give to Patient   RxID:   [id number] BROVANA 15 MCG/2ML NEBU (ARFORMOTEROL TARTRATE) 1 vial in nebulizer two times a day  #60 x 11   Entered by:   Vernie Murders   Authorized by:   Nyoka Cowden MD   Signed by:   Vernie Murders on 01/08/2010   Method used:   Print then Give to Patient   RxID:   [id number] TRAMADOL HCL 50 MG TABS (TRAMADOL HCL) 1 every 4 hours as needed  #40 x 0   Entered and Authorized by:   Nyoka Cowden MD   Signed by:   Nyoka Cowden MD on 01/08/2010   Method used:   Electronically to        [facility]* (retail)       [address]       [place], [place]  [address]       Ph: [phone] or [phone]       Fax: [phone]   RxID:   [id number] PREDNISONE 10 MG  TABS (PREDNISONE) 4 each am x 2days, 2x2days, 1x2days and stop  #14 x 0   Entered and Authorized by:   Nyoka Cowden MD   Signed by:   Nyoka Cowden MD on 01/08/2010   Method used:   Electronically to         [facility] (retail)       [address]       [place], [place]  [address]       Ph: [phone] or [phone]       Fax: [phone]   RxID:   [id number]

## 2010-07-20 NOTE — Procedures (Signed)
Summary: [facility]  [facility]   Imported By: Sherian Rein 12/17/2009 16:05:34  _____________________________________________________________________  External Attachment:    Type:   Image     Comment:   External Document

## 2010-07-20 NOTE — Assessment & Plan Note (Signed)
Summary: Pulmonary/ ext ov   Primary Provider/Referring Provider:  Maurice Small, MD  CC:  Runny nose and prod cough with white sputum x 1 wk.  .  History of Present Illness:  70  yowf never smoker c/o  cough since 2000  day > night, dry > wet  and exacerbated during times when she has URI.  Former workup  included allergy testing that showed only mild positivity and no response immunotherapy  1990s by Dr. Corinda Gubler,  negative FOB  in 2004, and only controlled as inpt with outpt nonadherence documented repeatedly.  08/02/07 c/o 3 months worsening dyspnea  after exac of cough stopped her from exercising.   when asked  if anything it ever help the cough she said no.  In fact the record indicates complete resolution during hospitalization 2004.  Seen 12/11/07  50% improvement, given prednisone taper and started on Symbicort 80/4.76mcg 2 pufs two times a day.    01/01/08 cough completely resolved but still doe x anything more than slow adl's.  overall though admits these she is the best she's been in years  . Increased symbicort to 160/4.5 2 two times a day   01/29/08--ov  added zyrtec at hs as needed for pnds pos response  March 11, 2008 increased sob but only used neb 3 x in last month, no noct co's, no overt hb. Rec try off reglan to reduce risk of longterm side effects-  April 30, 2008 ov :  worse x 2 weeks with increase dyspnea > cough, minimally productive. 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 C/o  1 week of productive cough, that is progressively worsening over last few days. Did not bring meds as requested in writing.   December 26, 2008 confused with meds again, not using prns aggressively. thinks nerves are the issue. cough  same as always,  dry daytime barking quality.  --Rx-steroid taper and restarted on reglan  January 12, 2009  ov:  med review. Improved from last visit. Cough is much better. Is taking reglan four times a day , using cough meds as needed. Less use of combivent.  May 11, 2009 ov rhinitis  no better with astepro, much better with atrovent.  Cough resolved. Still doe but not using rescue appropriately.  July 13, 2009 Followup with PFT's.  Pt c/o increased SOB with exertion x 2-3 wks.  She also c/o still having a dry cough.  She has noticed some wheezing when she goes outside.  rec change  to dulera  200 2 puffs first thing  in am and 2 puffs again in pm about 12 hours later   August 13, 2009 Acute visit.  pt c/o cough x 1 wk- prod with white sputum.  She also c/o increased SOB and wheezing.  Was seen by physician in [place] and given zpack and depomedrol.  using med calendar more consistently now to self manage.     September 07, 2009-- med review. last visit w/ flare tx w/ steroids. IMproved back to baseline.  rec d/c reglan due to shaking  Oct 19, 2009 6 wk followup.  Pt denies any complaints today.  States that her breathing has been okay.  No cough   December 01, 2009--Presents for  follow up for thrush and trial off dulera.  Last week called in w/ thrush. She was given mycelex. Has 2 days left. Mouth/tongue are still sore. She was recommended to stop dulera. January 08, 2010 ov 1 month followup.  Pt c/o increased SOB since more humid weather.  She states she is "in and out" all day running errands and doing yardwork and gets out of breath easily.  She also c/o frequent need to clear throat and has some prod cough with white sputum. brought meds and med calendar with multiple inconsistencies.  can't tolerate dulera, not doing as well on symbicort in terms of symptom control.  no noct or early am flares --Changed back to budesonide/brovana neb.   January 21, 2010--Presents for follow up and med review. She was changed back to  Budesonide and Brovana last visit. She is feeling better -best she has been in a while. She has brought all her meds. We organized them and updated her med calendar. She got assistance with her neb meds and now can afford them.  She is feeling some better, has been under alot of stress w/ recent death of son and daughter is sick alot w/  skin condition.   on clindamycin and erthromycin too > Thrush on neb > no better with magic mouthwash > started dlflucan  April 05, 2010 ov Runny nose, prod cough with white sputum x 1 wk.   Not following med calendar list of contingencies.  overall doing much better x for upper resp symptoms.  Pt denies any significant sore throat, dysphagia, itching, sneezing,  nasal congestion or excess / purulent secretions,  fever, chills, sweats, unintended wt loss, pleuritic or exertional cp, hempoptysis, change in activity tolerance  orthopnea pnd or leg swelling.  Thinks this is just her fall allergies acting up the way they always do.   Current Medications (verified): 1)  Multivitamins   Tabs (Multiple Vitamin) .... Take 1 Tablet By Mouth Once A Day 2)  Torsemide 20 Mg  Tabs (Torsemide) .... Take 1 Tablet By Mouth Once A Day 3)  Ery-Tab 250 Mg Tbec (Erythromycin Base) .... Take One Tablet 30 Minutes Before Breakfast,lunch,dinner and At Bedtime. 4)  Zegerid Otc 20-1100 Mg Caps (Omeprazole-Sodium Bicarbonate) .... Take 1 Capsule By Mouth Two Times A Day 5)  Crestor 10 Mg Tabs (Rosuvastatin Calcium) .... Take 1 Tablet By Mouth Once A Day 6)  Milk of Magnesia 7.75 % Susp (Magnesium Hydroxide) .... Per Bottle Directions As Needed 7)  Stool Softener 100 Mg Caps (Docusate Sodium) .... Take One Capsule At Bedtime 8)  Mucinex Dm 30-600 Mg  Tb12 (Dextromethorphan-Guaifenesin) .[name].. 1-2 By Mouth Two Times A Day As Needed 9)  Tessalon 200 Mg  Caps (Benzonatate) .[name].. 1 Every 8 Hours As Needed- Stopped Taking 10)  Tramadol Hcl 50 Mg Tabs (Tramadol Hcl) .[name].. 1 Every 4 Hours As Needed 11)   Meclizine Hcl 25 Mg Tabs (Meclizine Hcl) .[name].. 1 Tablet By Mouth Every 8 Hours As Needed 12)  Duoneb 0.5-2.5 (3) Mg/57ml  Soln (Ipratropium-Albuterol) .... Inhale 1 Neb Via Hhn Every 4-6 Hours As Needed- Stopped Taking 13)  Combivent 18-103 Mcg/act Aero (Ipratropium-Albuterol) .... 2 Puffs Every 4-6 Hours As Needed 14)  Lorazepam 1 Mg Tabs (Lorazepam) .... Take 1 Tablet By Mouth Every 12 Hours As Needed 15)  Chlor-Trimeton 4 Mg Tabs (Chlorpheniramine Maleate) .[name].. 1 Every 4-6 Hours As Needed 16)  Hyomax-Sl 0.125 Mg Subl (Hyoscyamine  Sulfate) .... 2 Tabs Sublingual Every 4 Hours As Needed For Pain 17)  Brovana 15 Mcg/66ml Nebu (Arformoterol Tartrate) .[name].. 1 Vial in Nebulizer Two Times A Day 18)  Budesonide 0.25 Mg/19ml Susp (Budesonide) .[name].. 1 Vial in Nebulizer Two Times A Day 19)  Magic Mouth Wash .... 1 Tsp Every 5 Hrs  Allergies (verified): 1)  ! Asa 2)  ! Codeine 3)  ! Sulfa 4)  ! Morphine 5)  ! Pcn 6)  ! Reglan (Metoclopramide Hcl)  Vital Signs:  Patient profile:   70 year old female Weight:      174.50 pounds O2 Sat:      99 % on Room air Temp:     98.6 degrees F oral Pulse rate:   78 / minute BP sitting:   130 / 76  (left arm)  Vitals Entered By: Vernie Murders (April 05, 2010 9:52 AM)  O2 Flow:  Room air  Physical Exam  Additional Exam:  wt  193 September 22, 2008 >  181 12/01/09 > 179 January 09, 2010 >>177 August  , 2011 > 174 April 05, 2010  HEENT mild nonspecific  turbinate edema.  Oropharynx no thrush or excess pnd or cobblestoning.  No JVD or cervical adenopathy. Mild accessory muscle hypertrophy. Trachea midline, nl thryroid. Chest decreased BS in base, no wheezing,  Regular rate and rhythm without murmur gallop or rub or increase P2 or edema.  Abd: no hsm, nl excursion. Ext warm without cyanosis or clubbing.     Impression & Recommendations:  Problem # 1:  COUGH (ICD-786.2)  The most common causes of chronic cough in immunocompetent adults include: upper airway cough  syndrome (UACS), previously referred to as postnasal drip syndrome,  caused by variety of rhinosinus conditions; (2) asthma; (3) GERD; (4) chronic bronchitis from cigarette smoking or other inhaled environmental irritants; (5) nonasthmatic eosinophilic bronchitis; and (6) bronchiectasis. These conditions, singly or in combination, have accounted for up to 94% of the causes of chronic cough in prospective studies.  This is most c/w  Classic Upper airway cough syndrome, so named because it's frequently impossible to sort out how much is  CR/sinusitis with freq throat clearing (which can be related to primary GERD)   vs  causing  secondary extra esophageal GERD from wide swings in gastric pressure that occur with throat clearing, promoting self use of mint and menthol lozenges that reduce the lower esophageal sphincter tone and exacerbate the problem further.  These are the same pts who not infrequently have failed to tolerate ace inhibitors,  dry powder inhalers or biphosphonates or report having reflux symptoms that don't respond to standard doses of PPI   For now try short course prednisone and have her follow rx  I emphasized to the patient that just like Dorothy in [employer], she is already wearing "ruby slippers" she can click anytime she wants:  the answer to all her recurrent symptoms  is already  literally at her fingertips:   all she has to do is refer to the medicine calendar we provided her  and her problems  are each addressed in a user-friendly format, reading from left to right for each symptoms she's likely to develop between office visits.    Orders: Est. Patient Level IV (48546)  Problem # 2:  CANDIDIASIS, ORAL (ICD-112.0)  No evidence at present but on inhaled steroids, erythromycin, clindamycin so may need to use diflucan on a as needed basis for up to 5 days  as MMW not very effective  Orders: Est. Patient Level IV (29528)  Problem # 3:  ASTHMATIC BRONCHITIS, ACUTE  (ICD-466.0) Lungs are completely clear so try reduce option : change to Brovana and Budesonide, the equivalent of Symbicort, per neb to one half dosing to see if mouth irritation better  Medications Added to Medication List This Visit: 1)  Milk of Magnesia 7.75 % Susp (Magnesium hydroxide) .... Per bottle directions as needed 2)  Tessalon 200 Mg Caps (Benzonatate) .[name].. 1 every 8 hours as needed- stopped taking 3)  Duoneb 0.5-2.5 (3) Mg/61ml Soln (Ipratropium-albuterol) .... Inhale 1 neb via hhn every 4-6 hours as needed- stopped taking 4)  Magic Mouth Wash  .... 1 tsp every 5 hrs 5)  Prednisone 10 Mg Tabs (Prednisone) .... 4 each am x 2days, 2x2days, 1x2days and stop  Other Orders: Prescription Created Electronically [id number])  Patient Instructions: 1)  Prednisone 10 mg 4 each am x 2days, 2x2days, 1x2days and stop  2)  Remember Dorothy and activate your action plan at the bottom of the calendar and let us know if not satisfied 3)  See Tammy NP w/in 6 weeks with all your medications, even over the counter meds, separated in two separate bags, the ones you take no matter what vs the ones you stop once you feel better and take only as needed.  She will generate for you a new user friendly medication calendar that will put [place] all on the same page re: your medication use.  Prescriptions: PREDNISONE 10 MG  TABS (PREDNISONE) 4 each am x 2days, 2x2days, 1x2days and stop  #14 x 0   Entered and Authorized by:   Nyoka Cowden MD   Signed by:   Nyoka Cowden MD on 04/05/2010   Method used:   Electronically to        [facility] (retail)       [address]       [place], [place]  [address]       Ph: [phone] or [phone]       Fax: [phone]   RxID:   [id number]

## 2010-07-21 NOTE — Consult Note (Signed)
NEW PATIENT CONSULTATION  Carrie [name], Carrie [name] DOB:  09/29/1940                                       07/20/2010 EAVWU#:[id number]  Carrie [name] is a 70 year old female referred by Dr. Madelon Lips for superficial thrombophlebitis of the left leg.  The patient had rotator cuff surgery on January 18 of this year.  She began having some left calf discomfort on January 17.  She has no swelling in the leg.  The pain worsened following the shoulder surgery.  She went to [facility] on January 22 where a venous duplex exam performed which I have reviewed the report.  It states that there is no deep vein thrombosis but there is no comment regarding the superficial venous system.  The patient continued to have increasing discomfort in the distal left thigh up to the inguinal area and she is referred today for further evaluation.  She has no history of DVT or previous thrombophlebitis.  She has not noted any swelling in the left ankle or foot.  Chronic medical problems: 1. COPD - asthma. 2. GERD. 3. Degenerative joint disease left shoulder. 4. History of bilateral breast cyst. 5. Negative for coronary artery disease, diabetes, hypertension,     hyperlipidemia or stroke.  SOCIAL HISTORY:  She is married and has 2 children, is retired and does not use tobacco or alcohol.  FAMILY HISTORY:  Strongly positive for coronary artery disease in mother, father and brother and sister.  Positive for diabetes in mother. Negative for stroke.  REVIEW OF SYSTEMS:  Positive for weight loss, leg discomfort with walking, productive cough, chronic bronchitis, asthma, wheezing, arthritis, dyspnea on exertion, anxiety, reflux esophagitis and hiatal hernia, dysphagia and constipation.  All systems are negative in complete review of systems.  PHYSICAL EXAMINATION:  Blood pressure 130/79, heart rate 86, respirations 14, temperature 98.6. GENERAL:  She is a well-developed, well-nourished female  in no apparent distress, alert and oriented x3. HEENT:  Exam normal for age.  EOMs intact. LUNGS:  Clear to auscultation.  No rhonchi or wheezing. CARDIOVASCULAR:  Regular rhythm.  No murmurs.  Carotid pulses 3+.  No audible bruits. ABDOMEN:  Soft, nontender with no masses. MUSCULOSKELETAL:  Exam is free of major deformities. NEUROLOGIC:  Normal. SKIN:  Free of rashes. Lower extremity exam reveals 3+ femoral and popliteal and 2+ dorsalis pedis pulses bilaterally.  Left leg has superficial thrombophlebitis with tender thrombosed varix in the distal medial thigh with tenderness up to just proximal to the inguinal crease.  There is no discrepancy in circumference bilaterally in the thigh and calf areas.  Today I ordered a venous duplex exam which I have reviewed and interpreted and also did an independent bedside ultrasound study to confirm the above findings.  There is no DVT in the left leg but she does have extensive superficial thrombophlebitis from the knee up to the saphenofemoral junction.  I discussed this with Dr. Maurice Small today by phone who has recommend anticoagulation for 3 months to hopefully prevent a DVT.  I discussed this with the patient who will go to Dr. Jone Baseman office today.  We will see her back in 3 months for a venous duplex exam in our office.    Quita Skye Hart [place], M.D. Electronically Signed  JDL/MEDQ  D:  07/20/2010  T:  07/21/2010  Job:  [id number]  cc:   Gretta Arab.  Laurann Montana, M.D.

## 2010-07-22 NOTE — Assessment & Plan Note (Signed)
Summary: Pulmonary/ ext preop eval > cleared for surgery   Primary Provider/Referring Provider:  Maurice Small, MD  CC:  Cough- worse x 2 wks.  Needs surgery clearance.[name]  History of Present Illness: 70  yowf never smoker c/o  cough since 2000  day > night, dry > wet  and exacerbated during times when she has URI.  Former workup  included allergy testing that showed only mild positivity and no response immunotherapy  1990s by Dr. Corinda Gubler,  negative FOB  in 2004, and only controlled as inpt with outpt nonadherence documented repeatedly.  08/02/07 c/o 3 months worsening dyspnea  after exac of cough stopped her from exercising.   when asked  if anything it ever help the cough she said no.  In fact the record indicates complete resolution during hospitalization 2004.  Seen 12/11/07  69% improvement, given prednisone taper and started on Symbicort 80/4.30mcg 2 pufs two times a day.    01/01/08 cough completely resolved but still doe x anything more than slow adl's.  overall though admits these she is the best she's been in years  . Increased symbicort to 160/4.5 2 two times a day   01/29/08--ov  added zyrtec at hs as needed for pnds pos response  March 11, 2008 increased sob but only used neb 3 x in last month, no noct co's, no overt hb. Rec try off reglan to reduce risk of longterm side effects-  April 30, 2008 ov :  worse x 2 weeks with increase dyspnea > cough, minimally productive. 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 C/o  1 week of productive cough, that is progressively worsening over last few days. Did not bring meds as requested in writing.   cont >>> December 26, 2008 confused with meds again, not using prns aggressively. thinks nerves are  the issue. cough  same as always, dry daytime barking quality.  --Rx-steroid taper and restarted on reglan  January 12, 2009  ov:  med review. Improved from last visit. Cough is much better. Is taking reglan four times a day , using cough meds as needed. Less use of combivent.  May 11, 2009 ov rhinitis  no better with astepro, much better with atrovent.  Cough resolved. Still doe but not using rescue appropriately.  July 13, 2009 Followup with PFT's.  Pt c/o increased SOB with exertion x 2-3 wks.  She also c/o still having a dry cough.  She has noticed some wheezing when she goes outside.  rec change  to dulera  200 2 puffs first thing  in am and 2 puffs again in pm about 12 hours later   August 13, 2009 Acute visit.  pt c/o cough x 1 wk- prod with white sputum.  She also c/o increased SOB and wheezing.  Was seen by physician in [place] and given zpack and depomedrol.  using med calendar more consistently now to self manage.     September 07, 2009-- med review. last visit w/ flare tx w/ steroids. IMproved back to baseline.  rec d/c reglan due to shaking  Oct 19, 2009 6 wk followup.  Pt denies any complaints today.  States that her breathing has  been okay.  No cough   December 01, 2009--Presents for  follow up for thrush and trial off dulera.  Last week called in w/ thrush. She was given mycelex. Has 2 days left. Mouth/tongue are still sore. She was recommended to stop dulera.   cont>>January 08, 2010 ov 1 month followup.  Pt c/o increased SOB since more humid weather.  She states she is "in and out" all day running errands and doing yardwork and gets out of breath easily.  She also c/o frequent need to clear throat and has some prod cough with white sputum. brought meds and med calendar with multiple inconsistencies.  can't tolerate dulera, not doing as well on symbicort in terms of symptom control.  no noct or early am flares --Changed back to budesonide/brovana neb.   January 21, 2010--Presents for follow  up and med review. She was changed back to Budesonide and Brovana last visit. She is feeling better -best she has been in a while. She has brought all her meds. We organized them and updated her med calendar. She got assistance with her neb meds and now can afford them.  She is feeling some better, has been under alot of stress w/ recent death of son and daughter is sick alot w/  skin condition.   on clindamycin and erthromycin too > Thrush on neb > no better with magic mouthwash > started dlflucan  April 05, 2010 ov Runny nose, prod cough with white sputum x 1 wk.   Not following med calendar list of contingencies.  overall doing much better x for upper resp symptoms.  Pt denies any significant sore throat, dysphagia, itching, sneezing,  nasal congestion or excess / purulent secretions,  fever, chills, sweats, unintended wt loss, pleuritic or exertional cp, hempoptysis, change in activity tolerance  orthopnea pnd or leg swelling.  Thinks this is just her fall allergies acting up the way they always do.    April 26, 2010 --Presents for an acute office visit. Complains of productive cough thick white,  and yellow mucus. Worse for last 10 days. She was seen 3 weeks ago and tx w/ steroids with improvement in cough-but never 100% cough free. She does have nasal drip, drainage intermittently. No overt reflux. Cough is getting worse and mucus is starting to change colors. She is leaving soon for vacation and is concerned she is getting worse. Previous CT scan 2008 and 2010 w/ no acute process, stable scattered lung nodules. She is a never smoker (hx 2nd hand smoke  exposure).  Denies chest pain,  orthopnea, hemoptysis, fever, n/v/d, edema, headache,recent travel. She is using tramadol on aver 2x day to help with cough, also using chlor tabs 4mg  two times a day for drippy nose.   May 24, 2010 --Returns for follow up and med review. She was tx for bronchitis last ov w/ omnicef. Feeling better but still has  some residual cough. We reveiwed her meds and organized her med calendar.  no change in rx  June 24, 2010 ov Cough- worse x 2 wks.  Needs surgery clearance. mucus is clear and day > night, only sob when can't stop coughing. Pt denies any significant sore throat, dysphagia, itching, sneezing,  nasal congestion or excess secretions,  fever, chills, sweats, unintended wt loss, pleuritic or exertional cp, hempoptysis, change in activity tolerance  orthopnea pnd or leg swelling. Pt also denies any obvious fluctuation in symptoms with weather or environmental change or other alleviating or aggravating  factors.     combivent no longer helping, not using max cough meds as per calendar  Current Medications (verified): 1)  Multivitamins   Tabs (Multiple Vitamin) .... Take 1 Tablet By Mouth Once A Day 2)  Evista 60 Mg Tabs (Raloxifene Hcl) .... Take 1 Tablet By Mouth Once A Day 3)  Budesonide 0.25 Mg/51ml Susp (Budesonide) .[name].. 1 Vial in Nebulizer Two Times A Day 4)  Brovana 15 Mcg/44ml Nebu (Arformoterol Tartrate) .[name].. 1 Vial in Nebulizer Two Times A Day 5)  Torsemide 20 Mg  Tabs (Torsemide) .... Take 1 Tablet By Mouth Once A Day 6)  Ery-Tab 250 Mg Tbec (Erythromycin Base) .... Take One Tablet 30 Minutes Before Breakfast,lunch,dinner and At Bedtime. 7)  Zegerid Otc 20-1100 Mg Caps (Omeprazole-Sodium Bicarbonate) .... Take 1 Capsule By Mouth Before First and Last Meals of The Day 8)  Crestor 10 Mg Tabs (Rosuvastatin Calcium) .... Take 1 Tab By Mouth At Bedtime 9)  Mucinex Dm 30-600 Mg  Tb12 (Dextromethorphan-Guaifenesin) .[name].. 1-2 By Mouth Two Times A Day As Needed W/ Flutter Valve 10)  Tramadol Hcl 50 Mg Tabs (Tramadol Hcl) .[name].. 1 Every 4 Hours As Needed 11)  Milk of Magnesia 7.75 % Susp (Magnesium Hydroxide) .... Per Bottle Directions As Needed 12)  Meclizine Hcl 25 Mg Tabs (Meclizine Hcl) .[name].. 1 Tablet By Mouth Every 8 Hours As Needed 13)  Combivent 18-103 Mcg/act Aero (Ipratropium-Albuterol)  .... 2 Puffs Every 4-6 Hours As Needed 14)  Lorazepam 1 Mg Tabs (Lorazepam) .... Take 1 Tablet By Mouth Every 12 Hours As Needed 15)  Chlor-Trimeton 4 Mg Tabs (Chlorpheniramine Maleate) .[name].. 1 Every 4-6 Hours As Needed 16)  Hyomax-Sl 0.125 Mg Subl (Hyoscyamine Sulfate) .... 2 Tabs Sublingual Every 4 Hours As Needed For Pain 17)  First-Dukes Mouthwash  Susp (Diphenhyd-Hydrocort-Nystatin) .[name].. 1 Teaspoon Every 6 Hours As Needed 18)  Atrovent 0.06 % Soln (Ipratropium Bromide) .... 2 Sprays Each Nostril Every 6 Hours As Needed For Nasal Drip 19)  Hydrocodone-Acetaminophen 5-325 Mg Tabs (Hydrocodone-Acetaminophen) .[name].. 1 To 2 Every 4 Hrs As Needed  Allergies (verified): 1)  ! Asa 2)  ! Codeine 3)  ! Sulfa 4)  ! Morphine 5)  ! Pcn 6)  ! Reglan (Metoclopramide Hcl)  Vital Signs:  Patient profile:   70 year old female Weight:      175.38 pounds O2 Sat:      96 % on Room air Temp:     98.0 degrees F oral Pulse rate:   76 / minute BP sitting:   132 / 78  (left arm)  Vitals Entered By: Vernie Murders (June 24, 2010 9:56 AM)  O2 Flow:  Room air CC: Cough- worse x 2 wks.  Needs surgery clearance.   Physical Exam  Additional Exam:  wt  193 September 22, 2008  >177 August  , 2011 > 174 April 05, 2010 > 175 June 24, 2010  HEENT mild nonspecific  turbinate edema.  Oropharynx no thrush or excess pnd or cobblestoning.  No JVD or cervical adenopathy. Mild accessory muscle hypertrophy. Trachea midline, nl thryroid. Chest decreased BS in base, no wheezing,  just pseudowheeze resolves with purse lip maneuver  Regular rate and rhythm without murmur gallop or rub or increase P2 or edema.  Abd: no hsm, nl excursion. Ext warm without cyanosis or clubbing.     Impression & Recommendations:  Problem # 1:  COUGH (ICD-786.2)  Markedly disproportionate to objective findings and probably largely driven  by anxiety and GERD at this point.  I had an extended discussion with the patient today lasting 15 to  20 minutes of a 25 minute visit on the following issues: No contraindication for shoulder surgery not following med calendar contingencies.  I emphasized to the patient that just like Dorothy in [employer], she is already wearing "ruby slippers" she can click anytime she wants:  the answer to all her recurrent symptoms  is already  literally at her fingertips:   all she has to do is refer to the medicine calendar we provided her  and her problems  are each addressed in a user-friendly format, reading from left to right for each symptoms she's likely to develop between office visits.    Change combivent to proaire as this is not copd  Orders: Prescription Created Electronically [phone]) Est. Patient Level IV (02542)  Medications Added to Medication List This Visit: 1)  Hydrocodone-acetaminophen 5-325 Mg Tabs (Hydrocodone-acetaminophen) .[name].. 1 to 2 every 4 hrs as needed 2)  Proair Hfa 108 (90 Base) Mcg/act Aers (Albuterol sulfate) .[name].. 1-2 puffs every 4-6 hours as needed 3)  Prednisone 10 Mg Tabs (Prednisone) .... 4 each am x 2 days,  3 x 2days, 2x2 days, and 1x2 days  Patient Instructions: 1)  Prednisone  4 each am x 2 days,  3 x 2days, 2x2 days, and 1x2 days  2)  Change combivent to Proaire 2 every 4 hours if needed 3)  See calendar for specific medication instructions and bring it back for each and every office visit for every healthcare provider you see.  Without it,  you may not receive the best quality medical care that we feel you deserve.  4)  you are cleared for shoulder surgery by Dr Madelon Lips Prescriptions: PREDNISONE 10 MG  TABS (PREDNISONE) 4 each am x 2 days,  3 x 2days, 2x2 days, and 1x2 days  #20 x 0   Entered and Authorized by:   Nyoka Cowden MD   Signed by:   Nyoka Cowden MD on 06/24/2010   Method used:   Electronically to        [facility] (retail)       [address]       [place], [place]  [address]       Ph: [phone] or  [phone]       Fax: [phone]   RxID:   [id number] PROAIR HFA 108 (90 BASE) MCG/ACT  AERS (ALBUTEROL SULFATE) 1-2 puffs every 4-6 hours as needed  #1 x 11   Entered and Authorized by:   Nyoka Cowden MD   Signed by:   Nyoka Cowden MD on 06/24/2010   Method used:   Electronically to        [facility] (retail)       [address]       [place], [place]  [address]       Ph: [phone] or [phone]       Fax: [phone]   RxID:   [id number]

## 2010-07-22 NOTE — Progress Notes (Signed)
Summary: Refill for Tramadol  Phone Note Refill Request Message from:  [facility] 336 [phone] or Fax [phone] on June 23, 2010 5:27 PM  Refills Requested: Medication #1:  TRAMADOL HCL 50 MG TABS 1 every 4 hours as needed   Dosage confirmed as above?Dosage Confirmed   Supply Requested: 1 month   Last Refilled: 05/25/2010 Tammy Parrett gave last refill.   Method Requested: Electronic Initial call taken by: Reynaldo Minium CMA,  June 23, 2010 5:28 PM  Follow-up for Phone Call        ok for refill Follow-up by: Nyoka Cowden MD,  June 24, 2010 10:34 AM    Prescriptions: TRAMADOL HCL 50 MG TABS (TRAMADOL HCL) 1 every 4 hours as needed  #40 x 0   Entered by:   Carron Curie CMA   Authorized by:   Nyoka Cowden MD   Signed by:   Carron Curie CMA on 06/24/2010   Method used:   Electronically to        [facility] (retail)       [address]       [place], [place]  [address]       Ph: [phone] or [phone]       Fax: [phone]   RxID:   [id number]

## 2010-07-22 NOTE — Letter (Signed)
Summary: [facility]  [facility]   Imported By: Sherian Rein 07/09/2010 10:55:58  _____________________________________________________________________  External Attachment:    Type:   Image     Comment:   External Document

## 2010-07-22 NOTE — Letter (Signed)
Summary: Pre-Op Clearance/[facility]  Pre-Op Clearance/[facility]   Imported By: Sherian Rein 07/01/2010 10:45:28  _____________________________________________________________________  External Attachment:    Type:   Image     Comment:   External Document

## 2010-07-23 ENCOUNTER — Ambulatory Visit (HOSPITAL_COMMUNITY)
Admission: RE | Admit: 2010-07-23 | Discharge: 2010-07-23 | Disposition: A | Discharge status: Home / Self Care | Payer: MEDICARE | Source: Ambulatory Visit | Attending: Orthopedic Surgery | Admitting: Orthopedic Surgery

## 2010-07-23 DIAGNOSIS — M25619 Stiffness of unspecified shoulder, not elsewhere classified: Secondary | ICD-10-CM | POA: Insufficient documentation

## 2010-07-23 DIAGNOSIS — IMO0001 Reserved for inherently not codable concepts without codable children: Secondary | ICD-10-CM | POA: Insufficient documentation

## 2010-07-23 DIAGNOSIS — M6281 Muscle weakness (generalized): Secondary | ICD-10-CM | POA: Insufficient documentation

## 2010-07-23 DIAGNOSIS — M25519 Pain in unspecified shoulder: Secondary | ICD-10-CM | POA: Insufficient documentation

## 2010-07-26 NOTE — Procedures (Unsigned)
DUPLEX DEEP VENOUS EXAM - LOWER EXTREMITY  INDICATION:  Superficial venous thrombophlebitis  HISTORY:  Edema:  yes Trauma/Surgery:  Recent shoulder surgery Pain:  yes PE:  no Previous DVT:  no Anticoagulants: Other:  DUPLEX EXAM:               CFV   SFV   PopV  PTV    GSV               R  L  R  L  R  L  R   L  R  L Thrombosis       0     0     0      0     + Spontaneous      +     +     +      +     0 Phasic           +     +     +      +     0 Augmentation     +     +     +      +     0 Compressible     +     +     +      +     0 Competent        +     +     +      +     0  Legend:  + - yes  o - no  p - partial  D - decreased  IMPRESSION: 1. No evidence of deep venous thrombosis in the left lower extremity. 2. Superficial venous thrombosis observed in the entire greater     saphenous vein which appears mobile and flush with the     saphenofemoral junction.   _____________________________ Quita Skye Hart [place], M.D.  LT/MEDQ  D:  07/20/2010  T:  07/20/2010  Job:  [id number]

## 2010-07-28 ENCOUNTER — Ambulatory Visit (HOSPITAL_COMMUNITY): Payer: Self-pay | Admitting: Specialist

## 2010-07-30 ENCOUNTER — Ambulatory Visit (HOSPITAL_COMMUNITY)
Admission: RE | Admit: 2010-07-30 | Discharge: 2010-07-30 | Disposition: A | Discharge status: Home / Self Care | Payer: MEDICARE | Source: Ambulatory Visit | Admitting: Occupational Therapy

## 2010-08-03 ENCOUNTER — Encounter (HOSPITAL_COMMUNITY)
Admission: RE | Admit: 2010-08-03 | Discharge: 2010-08-03 | Disposition: A | Discharge status: Home / Self Care | Payer: MEDICARE | Source: Ambulatory Visit | Attending: Orthopedic Surgery | Admitting: Orthopedic Surgery

## 2010-08-03 DIAGNOSIS — IMO0001 Reserved for inherently not codable concepts without codable children: Secondary | ICD-10-CM | POA: Insufficient documentation

## 2010-08-03 DIAGNOSIS — Z4789 Encounter for other orthopedic aftercare: Secondary | ICD-10-CM | POA: Insufficient documentation

## 2010-08-03 DIAGNOSIS — M6281 Muscle weakness (generalized): Secondary | ICD-10-CM | POA: Insufficient documentation

## 2010-08-03 DIAGNOSIS — M25519 Pain in unspecified shoulder: Secondary | ICD-10-CM | POA: Insufficient documentation

## 2010-08-06 ENCOUNTER — Ambulatory Visit (HOSPITAL_COMMUNITY)
Admission: RE | Admit: 2010-08-06 | Discharge: 2010-08-06 | Disposition: A | Discharge status: Home / Self Care | Payer: MEDICARE | Source: Ambulatory Visit | Attending: *Deleted | Admitting: *Deleted

## 2010-08-11 ENCOUNTER — Ambulatory Visit (HOSPITAL_COMMUNITY)
Admission: RE | Admit: 2010-08-11 | Discharge: 2010-08-11 | Disposition: A | Discharge status: Home / Self Care | Payer: MEDICARE | Source: Ambulatory Visit | Attending: *Deleted | Admitting: *Deleted

## 2010-08-13 ENCOUNTER — Ambulatory Visit (HOSPITAL_COMMUNITY): Payer: MEDICARE | Admitting: Specialist

## 2010-08-18 ENCOUNTER — Ambulatory Visit (HOSPITAL_COMMUNITY)
Admission: RE | Admit: 2010-08-18 | Discharge: 2010-08-18 | Disposition: A | Discharge status: Home / Self Care | Payer: MEDICARE | Source: Ambulatory Visit | Attending: Orthopedic Surgery | Admitting: Orthopedic Surgery

## 2010-08-20 ENCOUNTER — Ambulatory Visit (HOSPITAL_COMMUNITY)
Admission: RE | Admit: 2010-08-20 | Discharge: 2010-08-20 | Disposition: A | Discharge status: Home / Self Care | Payer: MEDICARE | Source: Ambulatory Visit | Attending: Orthopedic Surgery | Admitting: Orthopedic Surgery

## 2010-08-20 DIAGNOSIS — M6281 Muscle weakness (generalized): Secondary | ICD-10-CM | POA: Insufficient documentation

## 2010-08-20 DIAGNOSIS — M25619 Stiffness of unspecified shoulder, not elsewhere classified: Secondary | ICD-10-CM | POA: Insufficient documentation

## 2010-08-20 DIAGNOSIS — M25519 Pain in unspecified shoulder: Secondary | ICD-10-CM | POA: Insufficient documentation

## 2010-08-20 DIAGNOSIS — IMO0001 Reserved for inherently not codable concepts without codable children: Secondary | ICD-10-CM | POA: Insufficient documentation

## 2010-08-25 ENCOUNTER — Ambulatory Visit (HOSPITAL_COMMUNITY)
Admission: RE | Admit: 2010-08-25 | Discharge: 2010-08-25 | Disposition: A | Discharge status: Home / Self Care | Payer: MEDICARE | Source: Ambulatory Visit | Attending: Orthopedic Surgery | Admitting: Orthopedic Surgery

## 2010-08-27 ENCOUNTER — Ambulatory Visit (HOSPITAL_COMMUNITY)
Admission: RE | Admit: 2010-08-27 | Discharge: 2010-08-27 | Disposition: A | Discharge status: Home / Self Care | Payer: MEDICARE | Source: Ambulatory Visit | Attending: Orthopedic Surgery | Admitting: Orthopedic Surgery

## 2010-08-30 ENCOUNTER — Ambulatory Visit (HOSPITAL_COMMUNITY)
Admission: RE | Admit: 2010-08-30 | Discharge: 2010-08-30 | Disposition: A | Discharge status: Home / Self Care | Payer: MEDICARE | Source: Ambulatory Visit | Attending: Orthopedic Surgery | Admitting: Orthopedic Surgery

## 2010-09-01 ENCOUNTER — Ambulatory Visit (HOSPITAL_COMMUNITY)
Admission: RE | Admit: 2010-09-01 | Discharge: 2010-09-01 | Disposition: A | Discharge status: Home / Self Care | Payer: MEDICARE | Source: Ambulatory Visit | Attending: *Deleted | Admitting: *Deleted

## 2010-09-17 ENCOUNTER — Other Ambulatory Visit: Payer: Self-pay | Admitting: Family Medicine

## 2010-09-17 DIAGNOSIS — R42 Dizziness and giddiness: Secondary | ICD-10-CM

## 2010-09-23 ENCOUNTER — Ambulatory Visit
Admission: RE | Admit: 2010-09-23 | Discharge: 2010-09-23 | Disposition: A | Discharge status: Home / Self Care | Payer: MEDICARE | Source: Ambulatory Visit | Attending: Family Medicine | Admitting: Family Medicine

## 2010-09-23 DIAGNOSIS — R42 Dizziness and giddiness: Secondary | ICD-10-CM

## 2010-10-12 ENCOUNTER — Ambulatory Visit (INDEPENDENT_AMBULATORY_CARE_PROVIDER_SITE_OTHER): Payer: MEDICARE | Admitting: Vascular Surgery

## 2010-10-12 ENCOUNTER — Encounter (INDEPENDENT_AMBULATORY_CARE_PROVIDER_SITE_OTHER): Payer: MEDICARE

## 2010-10-12 DIAGNOSIS — I8000 Phlebitis and thrombophlebitis of superficial vessels of unspecified lower extremity: Secondary | ICD-10-CM

## 2010-10-13 NOTE — Assessment & Plan Note (Signed)
OFFICE VISIT  Carrie [name], Carrie [name] DOB:  06/24/1940                                       10/12/2010 EAVWU#:[id number]  Patient returns today for further follow-up regarding her superficial thrombophlebitis which occurred in mid January of this year following some rotator cuff surgery.  She had documented superficial thrombophlebitis with no DVT, but it did extend up to the saphenofemoral junction, and I evaluated her at Dr. Jone Baseman and Dr. Candise Bowens request January 31 and recommended Coumadin therapy for 3 months.  She returns today, states that the discomfort in the right thigh and groin area has almost completely resolved, and she has no distal edema.  She also has no history of DVT or stasis ulcers or bleeding varicosities.  CHRONIC MEDICAL PROBLEMS:  Which are stable: 1. COPD, asthma. 2. GERD. 3. Degenerative joint disease, left shoulder. 4. History of bilateral breast cysts. 5. Negative for coronary artery disease, diabetes, hypertension,     hyperlipidemia, or stroke.  SOCIAL HISTORY:  She is married and has 2 children.  She is retired. Does not use tobacco or alcohol.  REVIEW OF SYSTEMS:  Positive for weight loss and occasional productive cough, chronic bronchitis, asthma, wheezing, arthritis, dyspnea on exertion, anxiety, and reflux esophagitis.  PHYSICAL EXAMINATION:  Blood pressure 137/77, heart rate 71, respirations 20.  General:  She is a well-developed and well-nourished female in no apparent distress, alert and oriented x3.  HEENT:  Normal for age.  EOMs intact.  Lungs:  Clear to auscultation.  No rhonchi or wheezing.  Abdomen:  Soft, nontender with no masses.  Lower extremity exam reveals minimal tenderness along the course of the left great saphenous vein.  She has no distal edema and has 3+ distal pulses.  No bulging varicosities are noted.  Today I ordered a venous duplex exam in the lab, which I have reviewed and interpreted.   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  The anterior accessory branch is patent, but there is no reflux.  I reassured her regarding these findings.  No further treatment of her venous disease is indicated, and it will be fine to discontinue her Coumadin May 11th, as Dr. Valentina Lucks is planning.  I will see her on a p.r.n. basis.    Quita Skye Hart [place], M.D. Electronically Signed  JDL/MEDQ  D:  10/12/2010  T:  10/13/2010  Job:  [id number]

## 2010-10-14 NOTE — Procedures (Unsigned)
DUPLEX DEEP VENOUS EXAM - LOWER EXTREMITY  INDICATION:  Followup superficial venous thrombophlebitis.  HISTORY:  Edema:  No. Trauma surgery:  No. Pain:  Slight tenderness. PE:  No. Previous DVT:  Left great saphenous vein 07/20/2010. Anticoagulants:  Coumadin.  DUPLEX EXAM:               CFV   SFV   PopV  PTV    GSV               R  L  R  L  R  L  R   L  R  L Thrombosis    0  0     0     0      0     + Spontaneous   +  +     +     +      +     0 Phasic        +  +     +     +      +     0 Augmentation  +  +     +     +      +     0 Compressible  +  +     +     +      +     0 Competent     +  +     +     +      +     0  Legend:  + - yes  o - no  p - partial  D - decreased   IMPRESSION: 1. No evidence of deep venous thrombosis in the left lower extremity. 2. Superficial venous thrombosis observed. 3. The superficial venous thrombosis is noted from the proximal great     saphenous vein throughout the calf. 4. The left great saphenous vein is patent and compressible at the     saphenofemoral junction level only.       _____________________________ Quita Skye Hart [place], M.D.  EM/MEDQ  D:  10/12/2010  T:  10/12/2010  Job:  [id number]

## 2010-10-25 ENCOUNTER — Ambulatory Visit (HOSPITAL_COMMUNITY)
Admission: RE | Admit: 2010-10-25 | Discharge: 2010-10-25 | Disposition: A | Discharge status: Home / Self Care | Payer: Medicare Other | Source: Ambulatory Visit | Attending: Orthopedic Surgery | Admitting: Orthopedic Surgery

## 2010-10-25 DIAGNOSIS — IMO0001 Reserved for inherently not codable concepts without codable children: Secondary | ICD-10-CM | POA: Insufficient documentation

## 2010-10-25 DIAGNOSIS — R42 Dizziness and giddiness: Secondary | ICD-10-CM | POA: Insufficient documentation

## 2010-10-25 DIAGNOSIS — R269 Unspecified abnormalities of gait and mobility: Secondary | ICD-10-CM | POA: Insufficient documentation

## 2010-10-25 DIAGNOSIS — M6281 Muscle weakness (generalized): Secondary | ICD-10-CM | POA: Insufficient documentation

## 2010-10-27 ENCOUNTER — Ambulatory Visit (HOSPITAL_COMMUNITY)
Admission: RE | Admit: 2010-10-27 | Discharge: 2010-10-27 | Disposition: A | Discharge status: Home / Self Care | Payer: Medicare Other | Source: Ambulatory Visit | Attending: Physical Therapy | Admitting: Physical Therapy

## 2010-10-29 ENCOUNTER — Ambulatory Visit (HOSPITAL_COMMUNITY)
Admission: RE | Admit: 2010-10-29 | Discharge: 2010-10-29 | Disposition: A | Discharge status: Home / Self Care | Payer: Medicare Other | Source: Ambulatory Visit | Attending: Family Medicine | Admitting: Family Medicine

## 2010-11-01 ENCOUNTER — Ambulatory Visit (HOSPITAL_COMMUNITY): Payer: MEDICARE | Admitting: Physical Therapy

## 2010-11-05 NOTE — Discharge Summary (Signed)
NAMEWENDY, [name]                          ACCOUNT NO.:  [id number]   MEDICAL RECORD NO.:  [id number]                   PATIENT TYPE:  INP   LOCATION:  0455                                 FACILITY:  [facility]   PHYSICIAN:  Charlaine Dalton. Sherene Sires, M.D. [facility]           DATE OF BIRTH:  [birth year]   DATE OF ADMISSION:  05/20/2003  DATE OF DISCHARGE:  05/26/2003                                 DISCHARGE SUMMARY   FINAL DIAGNOSES:  1. Refractory cough felt to be due to a combination of reflux, chronic     rhinitis, and cyclical mechanism.     a. Status post bronchoscopy this admission May 23, 2003 revealing        diffuse airway erythema with 0% eosinophils on lavage.  2. Hyperlipidemia.   HISTORY:  Please see dictated H&P.  This patient has been coughing for 8  years with only 50% improvement on an intense outpatient regimen which  included aggressive treatment directed at rhinitis/sinusitis and complete  resolution of abnormal CT scan by a repeat sinus CT scan after therapy.  Because of the refractory nature of the cough and questioning whether or not  the patient could complete a very complicated medical regimen as well as the  need for IV steroids to control airways inflammation, I elected to put her  in the hospital.  Even after hospitalization we could not control the cough  until we added 24 hour a day suppression with Mepergan Forte.  I therefore  proceeded with bronchoscopy on May 23, 2003 indicating diffuse airway  erythema but no excess secretions.  Note, she had no significant  eosinophilia on lavage, albeit after several days of inpatient therapy.   Following the bronchoscopy, the patient appeared to improve to the point to  where she was not coughing at all but continued to clear her throat  frequently.  She seemed to benefit from flutter valve treatment as well as  around-the-clock nebulizer therapy with lidocaine.   At the time of discharge, therefore the patient is felt  to be at maximal  hospital benefit but having coughed for seven years I do not think it is  likely that we have cured her from coughing at this point.   I would like to see, however, how she does on the same complex regimen that  we used here in the hospital over the next seven days and see if she can  continue to derive the same benefit in terms of cough control on the  following regimen:  Continue Protonix 40 mg b.i.d. before meals, Pepcid 10  mg at bedtime, Singulair 10 mg one q.p.m., Welchol one daily, Zocor one  daily, and Nasonex two b.i.d. (she was supposed to be on these before  admission so I have asked her to continue them).  The new medications are:  Zelnorm 6 mg b.i.d., Ultram 50 mg q.i.d., Delsym two teaspoons b.i.d., and  prednisone  20 mg daily.   I have asked her to bring all of her medications back to the office in a  week to compare to the medication sheet that she has been provided today.   At the time of discharge, she has minimal throat clearing, no significant  cough or dyspnea but continues to be mildly hoarse.  She is to maintain a  normal diet.  I have asked her to curtail using her voice and general  activity for a week to determine whether or not we can maintain control of  the cough without additional measures.                                               Charlaine Dalton. Sherene Sires, M.D. [facility]    MBW/MEDQ  D:  05/26/2003  T:  05/26/2003  Job:  [id number]   cc:   Gretta Arab. Valentina Lucks, M.D.  [address] [place]  [place] [address]  Fax: [phone]

## 2010-11-05 NOTE — Op Note (Signed)
NAMESHAMIR, SEDLAR                          ACCOUNT NO.:  [id number]   MEDICAL RECORD NO.:  [id number]                   PATIENT TYPE:  INP   LOCATION:  0455                                 FACILITY:  [facility]   PHYSICIAN:  Charlaine Dalton. Sherene [name], M.D. [facility]           DATE OF BIRTH:  1941-05-15   DATE OF PROCEDURE:  05/23/2003  DATE OF DISCHARGE:                                 OPERATIVE REPORT   PROCEDURE:  Fiberoptic bronchoscopy diagnostic with lavage.   REFERRED BY:  Gretta Arab. Valentina Lucks, M.D.   HISTORY:  Please see dictated H&P plus progress notes.  This is a 70-year-  old white female with a refractory cough dating back over seven years.  She  has had an extensive previous workup which has not indicated a cause for  cough and at this point bronchoscopy is being performed because the patient  continues to feel there is something congested stimulating her cough (she  points to the mid tracheal area).  I wanted to be sure that there was not  evidence of any active eosinophilic bronchitis or neoplasm that might cause  a chronic cough but felt that this was a relatively low yield procedure and  actually have deferred it to the end of the workup.  Since she has not  responded to cough treatment even in the hospital, I thought it was  appropriate to proceed with bronchoscopy at this point.   The procedure was performed in the bronchoscopy suite with continuous  monitoring by surface ECG and oximetry.  She was initially treated with  updraft  nebulized lidocaine 1% and additionally given 2% lidocaine jelly at  the right nares.  The right nares was easily cannulated using a video  fiberoptic bronchoscope with good visualization in the entire oropharynx and  larynx.  She seemed to have an exquisitely sensitive upper airway which was  refractory even to topical lidocaine and required and required additional 5  mg of Versed and 25 mg of Demerol for adequate sedation and cough  suppression.   Using  an additional 1% lidocaine as needed, the entire tracheobronchial tree  was then explored bilaterally with the following findings:  There was  diffuse airway erythema and edema with marked dynamic obstruction on  expiration.  However, there were no focal endobronchial lesions or evidence  of longitudinal striae or evidence of chronic bronchitis.   PROCEDURE:  I performed a lavage in the right middle lobe with adequate  return for cytology, AFB and fungal stain and culture, cell count and  differential.   IMPRESSION:  Chronic cough of unclear etiology.  Diffuse erythema of the  airways.  At this point, I cannot be sure whether or not the erythema is the  primary problem or simply secondary to coughing.  Will try teaching her how  to use a flutter valve during coughing paroxysms to prevent the dynamic  airway collapse from causing airway trauma  and continue to treat her  empirically for airway inflammation over the next 2-3 days with a  combination of IV steroids and bronchodilators including lidocaine.   RECOMMENDATIONS:  See chart.   ADDENDUM:  There did not appear to be any upper airway lesions and the cords  moved normally on phonation.                                               Charlaine Dalton. Sherene [name], M.D. [facility]    MBW/MEDQ  D:  05/23/2003  T:  05/24/2003  Job:  [id number]   cc:   Gretta Arab. Valentina Lucks, M.D.  [address] [place]  [place] [address]  Fax: [phone]

## 2010-11-05 NOTE — H&P (Signed)
Carrie [name], Carrie [name]                          ACCOUNT NO.:  [id number]   MEDICAL RECORD NO.:  [id number]                   PATIENT TYPE:  INP   LOCATION:  0455                                 FACILITY:  [facility]   PHYSICIAN:  Charlaine Dalton. Sherene Sires, M.D. [facility]           DATE OF BIRTH:  [birth year]   DATE OF ADMISSION:  05/20/2003  DATE OF DISCHARGE:                                HISTORY & PHYSICAL   CHIEF COMPLAINT:  Coughing and dyspnea.   HISTORY:  This is a 70 year old white female never smoker with a history of  recurrent bronchitis with colds and complete resolution between flare-ups  until the mid to late 1990s.  Since that day she has been on daily treatment  for asthma, but has constant complaints of coughing, subjective wheezing,  and dyspnea.  I saw her initially on November 12 for an exacerbation that  started around October 1 with refractory cough that I thought was coming  from the upper airway.  I recommended she stop Advair and start prednisone  20 mg daily until better and then one-half daily thereafter.  She states she  was maybe 30% better after that initial visit but was seen on November 18  with persistent cough and therefore I obtained a sinus CT scan indicating  bilateral air fluid levels.  I treated her rigorously for sinus disease for  10 days and brought her back to the office yesterday.  She complained that  she was only marginally better, continuing to have severe coughing  paroxysms, in fact, could not finish a sentence in the office without severe  coughing paroxysms associated with pseudo wheeze and therefore I  recommended she be admitted to the hospital so we could see to what extent  we could make her 100% better with intense inpatient treatment (she states  she has not been completely better since the late 1990s).   Interestingly, she has really no significant nocturnal symptoms once she  gets comfortable.  She denies any significant sputum production, overt  sinus complaints, overt reflux symptoms, orthopnea, PND, leg swelling,  fevers, chills, sweats, or chest pain of any kind.   PAST MEDICAL HISTORY:  1. Previous allergy work-up done in the 1990s indicating allergy to dust     only with no response to immunotherapy.  2. Acid reflux disease, clinical diagnosis only.  3. Hyperlipidemia.  4. 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  5. Fibrocystic breast disease.   ALLERGIES:  ASPIRIN, CODEINE, SULFA, MORPHINE, PENICILLIN.   MEDICATIONS:  1. She is not consistent about taking the Protonix that I had previously     recommended.  Was taking Pepcid at bedtime.  2. Continues to take prednisone albeit at 10 mg daily.  3. Has ipratropium in a nebulizer she takes q.4h.  4. Singulair 10 mg daily.  5. Celebrex 200 mg one daily.  6. Welchol one daily.  7. Zocor one daily.  8. Nasarel nasal spray (not clear to me whether she is using Afrin or not as     she confused Afrin and Nasarel when I questioned her about this).   SOCIAL HISTORY:  She has never smoked.  She is retired with no unusual  occupational trouble, pet or hobby exposure.   FAMILY HISTORY:  Positive for allergies in her daughter, son, and  grandchildren, but not in her sisters, father, or mother nor any family  history of any asthma.   REVIEW OF SYSTEMS:  Taken in detail, essentially negative except as noted  above.   PHYSICAL EXAMINATION:  GENERAL:  This is a somewhat depressed appearing,  ambulatory white female with a hopeless, helpless affect and attitude.  She  had close classic voice fatigue in that the more she used her voice the more  hoarse she became and also started having severe coughing paroxysms.  She  was, however, afebrile with normal vital signs with a weight of 182 pounds.  HEENT:  Remarkable for the absence of turbinate edema, no evidence of  postnasal drainage or cobblestoning.  Dentition is intact.  Ear canals clear  bilaterally.  NECK:  Supple  without cervical adenopathy, tenderness.  Trachea midline.  No  thyromegaly.  LUNGS:  Lung fields reveal more pseudo wheeze than true wheeze bilaterally,  more inspiratory than expiratory.  There is regular rate and rhythm without  murmur, gallop, or rub present.  ABDOMEN:  Soft, benign with no palpable organomegaly, masses, or tenderness.  EXTREMITIES:  Warm without calf tenderness, clubbing, cyanosis, edema.  NEUROLOGIC:  No focal deficits or pathologic reflexes.  SKIN:  Normal.  MUSCULOSKELETAL:  Unremarkable.  Full range of motion of all major joints  and normal gait and station.   LABORATORIES:  Hemoglobin saturation 97% on room air.   Repeat sinus CT scan and chest x-ray as well as laboratory profile are  pending at time of this dictation.   IMPRESSION:  1. Refractory symptoms of coughing, wheezing, and dyspnea I believe are     largely upper airway in nature and probably represent poorly controlled     reflux.  2. She definitely has evidence of recent sinusitis and does have a history     of allergy to dust documented.  I am going to recheck a screening IgE and     eosinophil level on admission and consider ENT intervention as an     outpatient once we have her cough and wheezing under better control.   Specifically, at this point I plan to place her on the hospital and use IV  steroids around the clock, nebulized albuterol with lidocaine, and continue  to treat her sinuses aggressively with nasal steroids, but will avoid using  further Afrin at this point.                                               Charlaine Dalton. Sherene Sires, M.D. [facility]   MBW/MEDQ  D:  05/20/2003  T:  05/20/2003  Job:  [id number]   cc:   Gretta Arab. Valentina Lucks, M.D.  [address] [place]  [place] [address]  Fax: [phone]

## 2010-11-05 NOTE — Procedures (Signed)
[facility]  Patient:    Carrie [name], Carrie [name] Visit Number: [id number] MRN: [id number]          Service Type: END Location: ENDO Attending Physician:  Louie Bun Dictated by:   Everardo All Madilyn Fireman, M.D. Proc. Date: 12/24/01 Admit Date:  12/24/2001 Discharge Date: 12/24/2001   CC:         Gretta Arab. Valentina Lucks, M.D.   Procedure Report  PROCEDURE:  Esophagogastroduodenoscopy with biopsy.  INDICATION FOR PROCEDURE:  The patient undergoing colonoscopy due to a family history of colon cancer, who has also had a longstanding gastroesophageal reflux.  We decided to pursue EGD, primarily to rule out Barretts esophagus as an adjunct to colon cancer screening.  DESCRIPTION OF PROCEDURE:  The patient was placed in the left lateral decubitus position and placed on the pulse monitor with continuous low-flow oxygen delivered by nasal cannula.  She was sedated with 50 mg IV Demerol and 5 mg IV Versed.  The Olympus video endoscope was advanced under direct vision into the oropharynx and esophagus.  The esophagus was straight and of normal caliber with the squamocolumnar line at 38 cm.  There was no visible hiatal hernia, ring, stricture, or other abnormality of the GE junction.  The stomach was entered, and a small amount of liquid secretions were suctioned from the fundus.  Retroflexed view of the cardia was unremarkable.  The fundus and body appeared normal.  The antrum showed diffuse mild erythema and granularity of a questionable significance but possibly representing gastritis.  A CLOtest was obtained.  No ulcers were seen.  The pylorus was nondeformed and easily allowed passage of the endoscope tip into the duodenum.  Both the bulb and second portion were well-inspected and appeared to be within normal limits. The scope was then withdrawn, and the patient returned to the recovery room in stable condition.  She tolerated the procedure well, and there were  no immediate complications.  IMPRESSION:  Mild antral gastritis.  Otherwise normal endoscopy. Dictated by:   Everardo All Madilyn Fireman, M.D. Attending Physician:  Louie Bun DD:  12/24/01 TD:  12/26/01 Job: [id number]

## 2010-11-05 NOTE — Op Note (Signed)
[facility]  Patient:    Carrie [name], Carrie [name] Visit Number: [id number] MRN: [id number]          Service Type: END Location: ENDO Attending Physician:  Louie Bun Dictated by:   Everardo All Madilyn Fireman, M.D. Proc. Date: 12/24/01 Admit Date:  12/24/2001 Discharge Date: 12/24/2001   CC:         Gretta Arab. Valentina Lucks, M.D.   Operative Report  PROCEDURE PERFORMED:  Colonoscopy  INDICATION FOR PROCEDURE:  Family history of colon cancer in a second degree relative and ovarian cancer in a first degree relative.  DESCRIPTION OF PROCEDURE:  The patient was placed in the left lateral decubitus position and placed on the pulse monitor with continuous low flow oxygen delivered by nasal cannula.  She was sedated with 20 mg IV Demerol and 2 mg IV Versed in addition to the medicine given for the previous EGD.  The Olympus video colonoscope was inserted into the rectum and advanced to the cecum, confirmed by transillumination of McBurneys point and visualization of the ileocecal valve and appendiceal orifice. The prep was excellent.  The cecum, ascending and transverse colon appeared normal with no masses, polyps, diverticula or other mucosal abnormalities.  Within the descending sigmoid colon there were a few small scattered diverticula.  No other abnormalities. The rectum appeared normal and retroflexed view of the anus revealed no obvious internal hemorrhoids.  The colonoscope was then withdrawn.  The patient was returned to the recovery room in stable condition.  She tolerated the procedure well and there were no immediate complications.  IMPRESSION:  Scattered left sided diverticula, otherwise normal colonoscopy.  PLAN:  Repeat colonoscopy in five years based on the family history. Dictated by:   Everardo All Madilyn Fireman, M.D. Attending Physician:  Louie Bun DD:  12/24/01 TD:  12/26/01 Job: [id number]

## 2010-11-09 ENCOUNTER — Ambulatory Visit (HOSPITAL_COMMUNITY): Payer: MEDICARE | Admitting: Specialist

## 2010-11-12 ENCOUNTER — Encounter: Payer: Self-pay | Admitting: Adult Health

## 2010-11-24 ENCOUNTER — Ambulatory Visit (INDEPENDENT_AMBULATORY_CARE_PROVIDER_SITE_OTHER): Payer: Medicare Other | Admitting: Adult Health

## 2010-11-24 ENCOUNTER — Encounter: Payer: Self-pay | Admitting: Adult Health

## 2010-11-24 DIAGNOSIS — R05 Cough: Secondary | ICD-10-CM

## 2010-11-24 DIAGNOSIS — J45909 Unspecified asthma, uncomplicated: Secondary | ICD-10-CM

## 2010-11-24 DIAGNOSIS — I82409 Acute embolism and thrombosis of unspecified deep veins of unspecified lower extremity: Secondary | ICD-10-CM

## 2010-11-24 NOTE — Progress Notes (Signed)
Subjective:    Patient ID: Carrie [name], female    DOB: 03/20/1941, 70 y.o.   MRN: [id number]  HPI 44 yowf never smoker c/o cough since 2000 day > night, dry > wet and exacerbated during times when she has URI. Former workup included allergy testing that showed only mild positivity and no response immunotherapy 1990s by Dr. Corinda Gubler, negative FOB in 2004, and only controlled as inpt with outpt nonadherence documented repeatedly.   08/02/07 c/o 3 months worsening dyspnea after exac of cough stopped her from exercising. when asked if anything it ever help the cough she said no. In fact the record indicates complete resolution during hospitalization 2004.  Seen 12/11/07 50% improvement, given prednisone taper and started on Symbicort 80/4.74mcg 2 pufs two times a day.   01/01/08 cough completely resolved but still doe x anything more than slow adl's. overall though admits these she is the best she's been in years . Increased symbicort to 160/4.5 2 two times a day   01/29/08--ov added zyrtec at hs as needed for pnds pos response   March 11, 2008 increased sob but only used neb 3 x in last month, no noct co's, no overt hb. Rec try off reglan to reduce risk of longterm side effects-   April 30, 2008 ov : worse x 2 weeks with increase dyspnea > cough, minimally productive. 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 thinks nerves are the issue. cough same as always, dry daytime barking quality. --Rx-steroid taper and restarted on reglan   January 12, 2009 ov: med review. Improved from last visit. Cough is much better. Is taking reglan four times a day , using cough meds as needed. Less use of combivent.   May 11, 2009 ov rhinitis no better with  astepro, much better with atrovent. Cough resolved. Still doe but not using rescue appropriately.   July 13, 2009 Followup with PFT's. Pt c/o increased SOB with exertion x 2-3 wks. She also c/o still having a dry cough. She has noticed some wheezing when she goes outside. rec change to dulera 200   September 07, 2009-- med review. last visit w/ flare tx w/ steroids. IMproved back to baseline. rec d/c reglan due to shaking   Oct 19, 2009 6 wk followup. Pt denies any complaints today. States that her breathing has been okay. No cough   December 01, 2009--Presents for follow up for thrush and trial off dulera. Last week called in w/ thrush. She was given mycelex. Has 2 days left. Mouth/tongue are still sore. She was recommended to stop dulera.   January 08, 2010 ov 1 month followup. Pt c/o increased SOB since more humid weather. She states she is "in and out" all day running errands and doing yardwork and gets out of breath easily. She also c/o frequent need to clear throat and has some prod cough with white sputum. brought meds and med calendar with multiple inconsistencies. can't tolerate dulera, not doing as well on symbicort in terms of symptom control. no noct or early am flares --Changed back to budesonide/brovana neb.   January 21, 2010--Presents for follow up and med review. She was changed back to Budesonide and Brovana last visit. She  is feeling better -best she has been in a while. She has brought all her meds. We organized them and updated her med calendar. She got assistance with her neb meds and now can afford them. She is feeling some better, has been under alot of stress w/ recent death of son and daughter is sick alot w/ skin condition. on clindamycin and erthromycin too > Thrush on neb > no better with magic mouthwash > started dlflucan   April 26, 2010 --Presents for an acute office visit. Complains of productive cough thick white, and yellow mucus. Worse for last 10 days.  . Previous CT scan  2008 and 2010 w/ no acute process, stable scattered lung nodules. She is a never smoker (hx 2nd hand smoke exposure). . She is using tramadol on aver 2x day to help with cough, also using chlor tabs 4mg  two times a day for drippy nose.   May 24, 2010 --Returns for follow up and med review. She was tx for bronchitis last ov w/ omnicef. Feeling better but still has some residual cough. We reveiwed her meds and organized her med calendar. no change in rx   June 24, 2010 ov Cough- worse x 2 wks. Needs surgery clearance. mucus is clear and day > night, only sob when can't stop coughing.    >>cough regimen.   11/24/10 Follow up  Pt returns for follow up and med review. She has had multiple med changes over last few months. We organized her meds w/ pt education into med calendar.   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 She was treated with coumadin x 5 month, she was d/c off coumadin 2 weeks ago. Says she has been doing well.   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 Has upcoming ov with ENT (had to wait until off coumadin).   She says she is doing okay ,no flare in cough or dyspnea.   Review of Systems Constitutional:   No  weight loss, night sweats,  Fevers, chills,  +fatigue, or  lassitude.  HEENT:   No headaches,  Difficulty swallowing,  Tooth/dental problems, or  Sore throat,                No sneezing, itching, ear ache, nasal congestion, post nasal drip,   CV:  No chest pain,  Orthopnea, PND, swelling in lower extremities, anasarca, dizziness, palpitations, syncope.   GI  No heartburn, indigestion, abdominal pain, nausea, vomiting, diarrhea, change in bowel habits, loss of appetite, bloody stools.   Resp:   No excess mucus, no productive cough,  No non-productive cough,  No coughing up of blood.  No change in color of mucus.  No wheezing.  No chest wall deformity  Skin: no rash or  lesions.  GU: no dysuria, change in color of urine, no urgency or frequency.  No flank pain, no hematuria   MS:  No joint pain or swelling.  No decreased range of motion.     Psych:  No change in mood or affect. No depression or anxiety.            Objective:   Physical Exam GEN: A/Ox3; pleasant ,elderly female.   HEENT:  [place]/AT,  EACs-clear, TMs-wnl, NOSE-clear, THROAT-clear, no lesions, no postnasal drip or exudate noted.   NECK:  Supple w/ fair ROM; no JVD; normal carotid impulses w/o bruits; no thyromegaly or nodules palpated;  no lymphadenopathy.  RESP  Clear  P & A; w/o, wheezes/ rales/ or rhonchi.no accessory muscle use, no dullness to percussion  CARD:  RRR, no m/r/g  , tr  peripheral edema, pulses intact, no cyanosis or clubbing.  GI:   Soft & nt; nml bowel sounds; no organomegaly or masses detected.  Musco: Warm bil, no deformities or joint swelling noted.   Neuro: alert, no focal deficits noted.    Skin: Warm, no lesions or rashes         Assessment & Plan:

## 2010-11-24 NOTE — Assessment & Plan Note (Signed)
Compensated on present without flare Cont on current gerd/rhinitis prevention regimens.

## 2010-11-24 NOTE — Patient Instructions (Signed)
Continue on current regimen  Follow med calendar closely and bring to each visit.  follow up Dr. Sherene Sires  In 2 months. And As needed

## 2010-11-24 NOTE — Assessment & Plan Note (Signed)
Compensated on present regimen No recent flare  Cont on current regimen Patient's medications were reviewed today and patient education was given. Computerized medication calendar was adjusted/completed

## 2010-12-03 ENCOUNTER — Other Ambulatory Visit: Payer: Self-pay | Admitting: *Deleted

## 2011-02-07 ENCOUNTER — Ambulatory Visit (INDEPENDENT_AMBULATORY_CARE_PROVIDER_SITE_OTHER): Payer: Medicare Other | Admitting: Internal Medicine

## 2011-02-07 ENCOUNTER — Encounter: Payer: Self-pay | Admitting: Internal Medicine

## 2011-02-07 VITALS — BP 118/76 | HR 90 | Temp 98.1°F | Ht 66.5 in | Wt 175.8 lb

## 2011-02-07 DIAGNOSIS — R05 Cough: Secondary | ICD-10-CM

## 2011-02-07 MED ORDER — PREDNISONE (PAK) 10 MG PO TABS: ORAL_TABLET | ORAL | Status: AC

## 2011-02-07 MED ORDER — TRAMADOL HCL 50 MG PO TABS: 50.0000 mg | ORAL_TABLET | ORAL | Status: DC | PRN

## 2011-02-07 NOTE — Progress Notes (Deleted)
xcf

## 2011-02-07 NOTE — Progress Notes (Signed)
Subjective:     Patient ID: Carrie [name], female   DOB: [birth year], 70 y.o.   MRN: [id number]  HPI  20 yowf never smoker c/o cough since 2000 day > night, dry > wet and exacerbated during times when she has URI. Former workup included allergy testing that showed only mild positivity and no response immunotherapy 1990s by Dr. Corinda Gubler, negative FOB in 2004, and only controlled as inpt with outpt nonadherence documented repeatedly.   08/02/07 c/o 3 months worsening dyspnea after exac of cough stopped her from exercising. when asked if anything it ever help the cough she said no. In fact the record indicates complete resolution during hospitalization 2004.   Seen 12/11/07 50% improvement, given prednisone taper and started on Symbicort 80/4.33mcg 2 pufs two times a day.   01/01/08 cough completely resolved but still doe x anything more than slow adl's. overall though admits these she is the best she's been in years . Increased symbicort to 160/4.5 2 two times a day   March 11, 2008 increased sob but only used neb 3 x in last month, no noct co's, no overt hb. Rec try off reglan to reduce risk of longterm side effects-  April 30, 2008 ov : worse x 2 weeks with increase dyspnea > cough, minimally productive. --started back on reglan, -referred to GI for follow up reflux   September 07, 2009-- med review. last visit w/ flare tx w/ steroids. IMproved back to baseline. rec d/c reglan due to shaking     January 08, 2010 ov 1 month followup. Pt c/o increased SOB since more humid weather. She states she is "in and out" all day running errands and doing yardwork and gets out of breath easily. She also c/o frequent need to clear throat and has some prod cough with white sputum. brought meds and med calendar with multiple inconsistencies. not doing as well on symbicort in terms of symptom control. no noct or early am flares --Changed  to budesonide/brovana neb.   January 21, 2010--Presents for follow up and med  review. She was changed back to Budesonide and Brovana last visit. She is feeling better -best she has been in a while. She has brought all her meds. We organized them and updated her med calendar. She got assistance with her neb meds and now can afford them. She is feeling some better, has been under alot of stress w/ recent death of son and daughter is sick alot w/ skin condition. on clindamycin and erthromycin too > Thrush on neb > no better with magic mouthwash > started dlflucan   11/23/10 ov/NP doing better, new med calendar, multiple changes in chronic meds rec follow calendar, no change resp rx  02/07/2011 f/u ov/Wert cc mostly dry cough worse x one month day > night, does not wake up her up. Feels needs to use the proaire needed w/in an hour of the neb. Not following prns at bottom of calendar despite mulitple documented past attempts to help her self manage the problem. Pt denies any significant sore throat, dysphagia, itching, sneezing,  nasal congestion or excess/ purulent secretions,  fever, chills, sweats, unintended wt loss, pleuritic or exertional cp, hempoptysis, orthopnea pnd or leg swelling.    Also denies any obvious fluctuation of symptoms with weather or environmental changes or other aggravating or alleviating factors.    Allergies    1) ! Asa  2) ! Codeine  3) ! Sulfa  4) ! Morphine  5) ! Pcn  6) !  Reglan (Metoclopramide Hcl)     Past Surgical History:  Appendectomy  Tubal Ligation  Cystic surgery on breast-bilateral  Lt. Ankle Surgery    Family History:  mother died at 35 from heart attack  father died from heart attack  sister 1 died from cirrhosis of liver and subsequent staff infection  sister 2 is a diabetic  sister 3 has hx of heart attack and stents  brother has hx of heart disease and diabetes  No FH of Colon Cancer:  Family History of Breast Cancer:mother  Family History of Ovarian Cancer: Sister x 2  Family History of Colon Polyps: Mother    Social  History:  she has never smoked was previously exposed to her husband's cigarettes  occasionally exercises  no caffeine  no etoh  married  2 children      Past Medical History:  COUGH (ICD-786.2) onset about 20  - Flared off Reglan April 30, 2008  - Changed Reglan > erythromcyin per Arlyce Dice 08/2008  - Add back reglan December 26, 2008 >>reglan stopped intolerant  - Sinus ct 03/17/09 nl  -CT chest 11/08 scattered pulm nodules, 5/10 stable pulm nodules (never smoker)  Hyperlipidemia  Anxiety  Chronic Asthma, severe..............................................................................Marland KitchenWert  - FEV1 29%, ratio 43% with 16% improvement after bronchodilators 11/02/2007  - FEV1 33%, ratio 47, with 14% improvment after B2 July 13, 2009 with DLC0 59> 125% corrected  - [facility] July 13, 2009 50 %> 100% August 13, 2009 > 75% January 08, 2010  Complex med regimen- med calendar updated May 22, 2008 , .January 12, 2009, January 21, 2010 , 05/24/10  - Poor correlation between calendar and bags of meds January 08, 2010    Review of Systems     Objective:   Physical Exam     wt 193 September 22, 2008 >177 August , 2011 > 174 April 05, 2010 > 175 June 24, 2010 >  02/07/2011  175  HEENT mild nonspecific turbinate edema. Oropharynx no thrush or excess pnd or cobblestoning. No JVD or cervical adenopathy. Mild accessory muscle hypertrophy. Trachea midline, nl thryroid. Chest decreased BS in base, no wheezing, just pseudowheeze resolves with purse lip maneuver  Regular rate and rhythm without murmur gallop or rub or increase P2 or edema. Abd: no hsm, nl excursion. Ext warm without cyanosis or clubbing.  Assessment:         Plan:

## 2011-02-07 NOTE — Progress Notes (Deleted)
sfasdf

## 2011-02-07 NOTE — Assessment & Plan Note (Signed)
.  Chronic cough is often simultaneously caused by more than one condition. A single cause has been found from 38 to 82% of the time, multiple causes from 18 to 62%. Multiply caused cough has been the result of three diseases up to 42% of the time.     Suspect this is asthma/ rhinitis and reflux related, the latter the most important with complete relief of cough only while on reglan which she can't tolerate.  In addition, Of the three most common causes of chronic cough, only one (GERD)  can actually cause the other two (asthma and post nasal drip syndrome)  and perpetuate the cylce of cough inducing airway trauma, inflammation, heightened sensitivity to reflux which is prompted by the cough itself via a cyclical mechanism.    This may partially respond to steroids and look like asthma and post nasal drainage but never erradicated completely unless the cough and the secondary reflux are eliminated, preferably both at the same time.  While not intuitively obvious, many patients with chronic low grade reflux do not cough until there is a secondary insult that disturbs the protective epithelial barrier and exposes sensitive nerve endings.  This can be viral or direct physical injury such as with an endotracheal tube.   The point is that once this occurs, it is difficult to eliminate using anything but a maximally effective acid suppression regimen at least in the short run, accompanied by an appropriate diet to address non acid GERD.   See instructions for specific recommendations which were reviewed directly with the patient who was given a copy with highlighter outlining the key components.

## 2011-02-07 NOTE — Patient Instructions (Signed)
See calendar for specific medication instructions and bring it back for each and every office visit for every healthcare provider you see.  Without it,  you may not receive the best quality medical care that we feel you deserve.  You will note that the calendar groups together  your maintenance  medications that are timed at particular times of the day.  Think of this as your checklist for what your doctor has instructed you to do until your next evaluation to see what benefit  there is  to staying on a consistent group of medications intended to keep you well.  The other group at the bottom is entirely up to you to use as you see fit  for specific symptoms that may arise between visits that require you to treat them on an as needed basis.  Think of this as your action plan or "what if" list.   Separating the top medications from the bottom group is fundamental to providing you adequate care going forward - bring the two bags in when you return verify them and then move forward from there  Remember to use the zegrid before breakfast and at bedtime  Push mucinex dm to maxiumum with flutter valve with coughing   Prednisone 10 mg take  4 each am x 2 days,   2 each am x 2 days,  1 each am x2days and stop  Please schedule a follow up office visit in 4 weeks, sooner if needed

## 2011-03-08 ENCOUNTER — Encounter: Payer: Self-pay | Admitting: Internal Medicine

## 2011-03-08 ENCOUNTER — Ambulatory Visit (INDEPENDENT_AMBULATORY_CARE_PROVIDER_SITE_OTHER): Payer: Medicare Other | Admitting: Internal Medicine

## 2011-03-08 DIAGNOSIS — J45909 Unspecified asthma, uncomplicated: Secondary | ICD-10-CM

## 2011-03-08 DIAGNOSIS — R05 Cough: Secondary | ICD-10-CM

## 2011-03-08 NOTE — Patient Instructions (Addendum)
See calendar for specific medication instructions and bring it back for each and every office visit for every healthcare provider you see.  Without it,  you may not receive the best quality medical care that we feel you deserve.  You will note that the calendar groups together  your maintenance  medications that are timed at particular times of the day.  Think of this as your checklist for what your doctor has instructed you to do until your next evaluation to see what benefit  there is  to staying on a consistent group of medications intended to keep you well.  The other group at the bottom is entirely up to you to use as you see fit  for specific symptoms that may arise between visits that require you to treat them on an as needed basis.  Think of this as your action plan or "what if" list.   Separating the top medications from the bottom group is fundamental to providing you adequate care going forward.   Work on inhaler technique:  relax and gently blow all the way out then take a nice smooth deep breath back in, triggering the inhaler at same time you start breathing in.  Hold for up to 5 seconds if you can.  Rinse and gargle with water when done   If your mouth or throat starts to bother you,   I suggest you time the inhaler to your dental care and after using the inhaler(s) brush teeth and tongue with a baking soda containing toothpaste and when you rinse this out, gargle with it first to see if this helps your mouth and throat.    Reduce the lorazepam to 1 mg take one half every 12 hours as needed for nerves and maximize the mucus relief/ tramadol to control your cough taking them up to every 4 hours around the clock  See Tammy NP in 2 weeks to check your progress and write you a new med calendar.

## 2011-03-08 NOTE — Progress Notes (Signed)
Subjective:     Patient ID: Carrie [name], female   DOB: 1940-07-21, 70 y.o.   MRN: [id number]  HPI  65 yowf never smoker c/o cough since 2000 day > night, dry > wet and exacerbated during times when she has URI. Former workup included allergy testing that showed only mild positivity and no response immunotherapy 1990s by Dr. Corinda [name], negative FOB in 2004, and only controlled as inpt with outpt nonadherence documented repeatedly.   08/02/07 c/o 3 months worsening dyspnea after exac of cough stopped her from exercising. when asked if anything it ever help the cough she said no. In fact the record indicates complete resolution during hospitalization 2004.   Seen 12/11/07 50% improvement, given prednisone taper and started on Symbicort 80/4.63mcg 2 pufs two times a day.   01/01/08 cough completely resolved but still doe x anything more than slow adl's. overall though admits these she is the best she's been in years . Increased symbicort to 160/4.5 2 two times a day   March 11, 2008 increased sob but only used neb 3 x in last month, no noct co's, no overt hb. Rec try off reglan to reduce risk of longterm side effects-  April 30, 2008 ov : worse x 2 weeks with increase dyspnea > cough, minimally productive. --started back on reglan, -referred to GI for follow up reflux   September 07, 2009-- med review. last visit w/ flare tx w/ steroids. IMproved back to baseline. rec d/c reglan due to shaking     January 08, 2010 ov 1 month followup. Pt c/o increased SOB since more humid weather. She states she is "in and out" all day running errands and doing yardwork and gets out of breath easily. She also c/o frequent need to clear throat and has some prod cough with white sputum. brought meds and med calendar with multiple inconsistencies. not doing as well on symbicort in terms of symptom control. no noct or early am flares --Changed  to budesonide/brovana neb.   January 21, 2010--Presents for follow up and med  review. She was changed back to Budesonide and Brovana last visit. She is feeling better -best she has been in a while. She has brought all her meds. We organized them and updated her med calendar. She got assistance with her neb meds and now can afford them. She is feeling some better, has been under alot of stress w/ recent death of son and daughter is sick alot w/ skin condition. on clindamycin and erthromycin too > Thrush on neb > no better with magic mouthwash > started dlflucan   11/23/10 ov/NP doing better, new med calendar, multiple changes in chronic meds rec follow calendar, no change resp rx  02/07/2011  f/u ov/Carrie [name] cc mostly dry cough worse x one month day > night, does not wake up her up. Feels needs to use the proaire needed w/in an hour of the neb. Not following prns at bottom of calendar despite mulitple documented past attempts to help her self manage the problem rec  Follow the med calendar.  Remember to use the zegrid before breakfast and at bedtime  Push mucinex dm to maxiumum with flutter valve with coughing   Prednisone 10 mg take  4 each am x 2 days,   2 each am x 2 days,  1 each am x2days and stop    03/08/2011 f/u ov/Carrie [name] cc  Cough > sob but neither better, not using calendar as instructed re use of prns listed as  specific action plans with max doses listed in a very user friendly format, esp meds intended to help with cough, no response to chortrimeton.  Sleeping ok without nocturnal  or early am exacerbation  of respiratory  c/o's or need for noct saba. Also denies any obvious fluctuation of symptoms with weather or environmental changes or other aggravating or alleviating factors except as outlined above - no better p prednisone.  Pt denies any significant sore throat, dysphagia, itching, sneezing,  nasal congestion or excess/ purulent secretions,  fever, chills, sweats, unintended wt loss, pleuritic or exertional cp, hempoptysis, orthopnea pnd or leg swelling.    Also  denies any obvious fluctuation of symptoms with weather or environmental changes or other aggravating or alleviating factors.    Allergies    1) ! Asa  2) ! Codeine  3) ! Sulfa  4) ! Morphine  5) ! Pcn  6) ! Reglan (Metoclopramide Hcl)     Past Surgical History:  Appendectomy  Tubal Ligation  Cystic surgery on breast-bilateral  Lt. Ankle Surgery    Family History:  mother died at 73 from heart attack  father died from heart attack  sister 1 died from cirrhosis of liver and subsequent staff infection  sister 2 is a diabetic  sister 3 has hx of heart attack and stents  brother has hx of heart disease and diabetes  No FH of Colon Cancer:  Family History of Breast Cancer:mother  Family History of Ovarian Cancer: Sister x 2  Family History of Colon Polyps: Mother    Social History:  she has never smoked was previously exposed to her husband's cigarettes  occasionally exercises  no caffeine  no etoh  married  2 children  Husband on hospice     Past Medical History:  COUGH (ICD-786.2) onset about 65  - Flared off Reglan April 30, 2008  - Changed Reglan > erythromcyin per Carrie [name] 08/2008  - Add back reglan December 26, 2008 >>reglan stopped intolerant  - Sinus ct 03/17/09 nl  -CT chest 11/08 scattered pulm nodules, 5/10 stable pulm nodules (never smoker)  Hyperlipidemia  Anxiety  Chronic Asthma, severe..............................................................................Marland KitchenWert  - FEV1 29%, ratio 43% with 16% improvement after bronchodilators 11/02/2007  - FEV1 33%, ratio 47, with 14% improvment after B2 July 13, 2009 with DLC0 59> 125% corrected  - [facility] July 13, 2009 50 %> 100% August 13, 2009 > 75% January 08, 2010  Complex med regimen- med calendar updated May 22, 2008 , .January 12, 2009, January 21, 2010 , 05/24/10  - Poor correlation between calendar and bags of meds January 08, 2010          Objective:   Physical Exam     wt 193 September 22, 2008  > 175  June 24, 2010 >  02/07/2011  175 > 177 03/08/2011  HEENT mild nonspecific turbinate edema. Oropharynx no thrush or excess pnd or cobblestoning. No JVD or cervical adenopathy. Mild accessory muscle hypertrophy. Trachea midline, nl thryroid. Chest decreased BS in base, no wheezing, just pseudowheeze resolves with purse lip maneuver  Regular rate and rhythm without murmur gallop or rub or increase P2 or edema. Abd: no hsm, nl excursion. Ext warm without cyanosis or clubbing.  Assessment:         Plan:

## 2011-03-09 ENCOUNTER — Encounter: Payer: Self-pay | Admitting: Internal Medicine

## 2011-03-09 NOTE — Assessment & Plan Note (Addendum)
Symptoms are markedly disproportionate to objective findings and not clear this is a lung problem but pt does appear to have difficult airway management issues.  DDX of  difficult airways managment all start with A and  include Adherence, Ace Inhibitors, Acid Reflux, Active Sinus Disease, Alpha 1 Antitripsin deficiency, Anxiety masquerading as Airways dz,  ABPA,  allergy(esp in young), Aspiration (esp in elderly), Adverse effects of DPI,  Active smokers, plus two Bs  = Bronchiectasis and Beta blocker use..and one C= CHF   Adherence is always the initial "prime suspect" and is a multilayered concern that requires a "trust but verify" approach in every patient - starting with knowing how to use medications, especially inhalers, correctly, keeping up with refills and understanding the fundamental difference between maintenance and prns vs those medications only taken for a very short course and then stopped and not refilled. The proper method of use, as well as anticipated side effects, of this metered-dose inhaler are discussed and demonstrated to the patient. Improved to 50% but no better  ? Anxiety > usually dx of exclusion but all her symptoms flared when husband went on hospice.

## 2011-03-09 NOTE — Assessment & Plan Note (Addendum)
Chronic cough is often simultaneously caused by more than one condition. A single cause has been found from 38 to 82% of the time, multiple causes from 18 to 62%. Multiply caused cough has been the result of three diseases up to 42% of the time.   She has components of rhinitis, gerd and asthma that will all need to be addressed aggressively, as well as chronic ? neuropathic throat tickle/cough that doesn't respond to 1st gen H1 per guidelines.     Each maintenance medication was reviewed in detail including most importantly the difference between maintenance and as needed and under what circumstances the prns are to be used.  This was done in the context of a medication calendar review which provided the patient with a user-friendly unambiguous mechanism for medication administration and reconciliation and provides an action plan for all active problems. It is critical that this be shown to every doctor  for modification during the office visit if necessary so the patient can use it as a working document.

## 2011-03-22 ENCOUNTER — Ambulatory Visit (INDEPENDENT_AMBULATORY_CARE_PROVIDER_SITE_OTHER): Payer: Medicare Other | Admitting: Adult Health

## 2011-03-22 ENCOUNTER — Encounter: Payer: Self-pay | Admitting: Adult Health

## 2011-03-22 VITALS — BP 112/60 | HR 90 | Temp 98.6°F | Ht 66.5 in | Wt 176.0 lb

## 2011-03-22 DIAGNOSIS — J45909 Unspecified asthma, uncomplicated: Secondary | ICD-10-CM

## 2011-03-22 DIAGNOSIS — Z23 Encounter for immunization: Secondary | ICD-10-CM

## 2011-03-22 MED ORDER — TRAMADOL HCL 50 MG PO TABS: 50.0000 mg | ORAL_TABLET | ORAL | Status: DC | PRN

## 2011-03-22 NOTE — Progress Notes (Signed)
Addended by: Christen Butter on: 03/22/2011 02:31 PM   Modules accepted: Orders

## 2011-03-22 NOTE — Patient Instructions (Signed)
Flu shot today  Follow med calendar closely and bring to each visit.  follow up Dr. Sherene Sires  In 3 months and As needed

## 2011-03-22 NOTE — Assessment & Plan Note (Signed)
Recent mild flare with URI now resolving Patient's medications were reviewed today and patient education was given. Computerized medication calendar was adjusted/completed Flu shot today  follow up 3 months and As needed

## 2011-03-22 NOTE — Progress Notes (Signed)
Addended by: Christen Butter on: 03/22/2011 02:34 PM   Modules accepted: Orders

## 2011-03-22 NOTE — Progress Notes (Signed)
Subjective:     Patient ID: Carrie [name], female   DOB: 1940/07/07, 70 y.o.   MRN: [id number]  HPI  89 yowf never smoker c/o cough since 2000 day > night, dry > wet and exacerbated during times when she has URI. Former workup included allergy testing that showed only mild positivity and no response immunotherapy 1990s by Dr. Corinda Gubler, negative FOB in 2004, and only controlled as inpt with outpt nonadherence documented repeatedly.   08/02/07 c/o 3 months worsening dyspnea after exac of cough stopped her from exercising. when asked if anything it ever help the cough she said no. In fact the record indicates complete resolution during hospitalization 2004.   Seen 12/11/07 50% improvement, given prednisone taper and started on Symbicort 80/4.73mcg 2 pufs two times a day.   01/01/08 cough completely resolved but still doe x anything more than slow adl's. overall though admits these she is the best she's been in years . Increased symbicort to 160/4.5 2 two times a day   March 11, 2008 increased sob but only used neb 3 x in last month, no noct co's, no overt hb. Rec try off reglan to reduce risk of longterm side effects-  April 30, 2008 ov : worse x 2 weeks with increase dyspnea > cough, minimally productive. --started back on reglan, -referred to GI for follow up reflux   September 07, 2009-- med review. last visit w/ flare tx w/ steroids. IMproved back to baseline. rec d/c reglan due to shaking     January 08, 2010 ov 1 month followup. Pt c/o increased SOB since more humid weather. She states she is "in and out" all day running errands and doing yardwork and gets out of breath easily. She also c/o frequent need to clear throat and has some prod cough with white sputum. brought meds and med calendar with multiple inconsistencies. not doing as well on symbicort in terms of symptom control. no noct or early am flares --Changed  to budesonide/brovana neb.   January 21, 2010--Presents for follow up and med  review. She was changed back to Budesonide and Brovana last visit. She is feeling better -best she has been in a while. She has brought all her meds. We organized them and updated her med calendar. She got assistance with her neb meds and now can afford them. She is feeling some better, has been under alot of stress w/ recent death of son and daughter is sick alot w/ skin condition. on clindamycin and erthromycin too > Thrush on neb > no better with magic mouthwash > started dlflucan   11/23/10 ov/NP doing better, new med calendar, multiple changes in chronic meds rec follow calendar, no change resp rx  02/07/2011  f/u ov/Wert cc mostly dry cough worse x one month day > night, does not wake up her up. Feels needs to use the proaire needed w/in an hour of the neb. Not following prns at bottom of calendar despite mulitple documented past attempts to help her self manage the problem rec  Follow the med calendar.  Remember to use the zegrid before breakfast and at bedtime  Push mucinex dm to maxiumum with flutter valve with coughing   Prednisone 10 mg take  4 each am x 2 days,   2 each am x 2 days,  1 each am x2days and stop    03/08/2011 f/u ov/[facility]ert cc  Cough > sob but neither better, not using calendar as instructed re use of prns listed as  specific action plans with max doses listed in a very user friendly format, esp meds intended to help with cough, no response to chortrimeton. >>decreased lorazepam and encouraged flutter valve   03/22/2011 Follow Up  Pt  Returns for follow up and med review. She feels that her breathing has improved slightly since last visit. Last ov recommended to maximize her mucinex dm and flutter valve. She says cough is much better. We reviewed her meds and updated her med calendar w/ pt education  No discolored mucus or fever.   Allergies    1) ! Asa  2) ! Codeine  3) ! Sulfa  4) ! Morphine  5) ! Pcn  6) ! Reglan (Metoclopramide Hcl)     Past Surgical History:    Appendectomy  Tubal Ligation  Cystic surgery on breast-bilateral  Lt. Ankle Surgery    Family History:  mother died at 39 from heart attack  father died from heart attack  sister 1 died from cirrhosis of liver and subsequent staff infection  sister 2 is a diabetic  sister 3 has hx of heart attack and stents  brother has hx of heart disease and diabetes  No FH of Colon Cancer:  Family History of Breast Cancer:mother  Family History of Ovarian Cancer: Sister x 2  Family History of Colon Polyps: Mother    Social History:  she has never smoked was previously exposed to her husband's cigarettes  occasionally exercises  no caffeine  no etoh  married  2 children  Husband on hospice     Past Medical History:  COUGH (ICD-786.2) onset about 12  - Flared off Reglan April 30, 2008  - Changed Reglan > erythromcyin per Arlyce Dice 08/2008  - Add back reglan December 26, 2008 >>reglan stopped intolerant  - Sinus ct 03/17/09 nl  -CT chest 11/08 scattered pulm nodules, 5/10 stable pulm nodules (never smoker)  Hyperlipidemia  Anxiety  Chronic Asthma, severe..............................................................................Marland KitchenWert  - FEV1 29%, ratio 43% with 16% improvement after bronchodilators 11/02/2007  - FEV1 33%, ratio 47, with 14% improvment after B2 July 13, 2009 with DLC0 59> 125% corrected  - [facility] July 13, 2009 50 %> 100% August 13, 2009 > 75% January 08, 2010  Complex med regimen- med calendar updated May 22, 2008 , .January 12, 2009, January 21, 2010 , 05/24/10 , 03/22/2011  - Poor correlation between calendar and bags of meds January 08, 2010     Constitutional:   No  weight loss, night sweats,  Fevers, chills,    HEENT:   No headaches,  Difficulty swallowing,  Tooth/dental problems, or  Sore throat,                No sneezing, itching, ear ache, nasal congestion, post nasal drip,   CV:  No chest pain,  Orthopnea, PND, swelling in lower extremities, anasarca, dizziness,  palpitations, syncope.   GI  No heartburn, indigestion, abdominal pain, nausea, vomiting, diarrhea, change in bowel habits, loss of appetite, bloody stools.   Resp:    No coughing up of blood.     No chest wall deformity  Skin: no rash or lesions.  GU: no dysuria, change in color of urine, no urgency or frequency.  No flank pain, no hematuria   MS:  No joint pain or swelling.  No decreased range of motion.  No back pain.  Psych:  No change in mood or affect. No depression or anxiety.  No memory loss.  Objective:   Physical Exam     wt 193 September 22, 2008  > 175 June 24, 2010 >  02/07/2011  175 > 177 03/08/2011 >>176 03/22/2011  HEENT mild nonspecific turbinate edema. Oropharynx no thrush or excess pnd or cobblestoning. No JVD or cervical adenopathy. Mild accessory muscle hypertrophy. Trachea midline, nl thryroid. Chest decreased BS in base, no wheezing, Regular rate and rhythm without murmur gallop or rub or increase P2 or edema. Abd: no hsm, nl excursion. Ext warm without cyanosis or clubbing.  Assessment:         Plan:

## 2011-06-07 ENCOUNTER — Telehealth: Payer: Self-pay | Admitting: Allergy

## 2011-06-07 MED ORDER — TRAMADOL HCL 50 MG PO TABS: 50.0000 mg | ORAL_TABLET | ORAL | Status: DC | PRN

## 2011-06-07 NOTE — Telephone Encounter (Signed)
[facility] Tramadol 50 mg 1 tablet q 4 hours prn if still coughing. Allergies  Allergen Reactions  . Aspirin     REACTION: hives  . Codeine     REACTION: rash/itch  . Metoclopramide Hcl     REACTION: intolerant, restless hands  . Morphine     REACTION: hives  . Penicillins     REACTION: rash/itch  . Sulfonamide Derivatives     REACTION: rash/itch  DR Sherene Sires is this ok to refill.

## 2011-06-07 NOTE — Telephone Encounter (Signed)
Rx has been sent  

## 2011-06-07 NOTE — Telephone Encounter (Signed)
Ok to refill 

## 2011-06-21 HISTORY — PX: CORONARY ANGIOPLASTY WITH STENT PLACEMENT: SHX49

## 2011-07-04 ENCOUNTER — Encounter: Payer: Self-pay | Admitting: Internal Medicine

## 2011-07-04 ENCOUNTER — Ambulatory Visit (INDEPENDENT_AMBULATORY_CARE_PROVIDER_SITE_OTHER): Payer: Medicare Other | Admitting: Internal Medicine

## 2011-07-04 VITALS — BP 122/80 | HR 100 | Temp 99.1°F | Ht 66.5 in | Wt 179.6 lb

## 2011-07-04 DIAGNOSIS — R05 Cough: Secondary | ICD-10-CM

## 2011-07-04 DIAGNOSIS — J45909 Unspecified asthma, uncomplicated: Secondary | ICD-10-CM

## 2011-07-04 DIAGNOSIS — J310 Chronic rhinitis: Secondary | ICD-10-CM

## 2011-07-04 MED ORDER — PREDNISONE (PAK) 10 MG PO TABS: ORAL_TABLET | ORAL | Status: AC

## 2011-07-04 NOTE — Patient Instructions (Signed)
Prednisone 10 mg take  4 each am x 2 days,   2 each am x 2 days,  1 each am x2days and stop  See calendar for specific medication instructions and bring it back for each and every office visit for every healthcare provider you see.  Without it,  you may not receive the best quality medical care that we feel you deserve.  You will note that the calendar groups together  your maintenance  medications that are timed at particular times of the day.  Think of this as your checklist for what your doctor has instructed you to do until your next evaluation to see what benefit  there is  to staying on a consistent group of medications intended to keep you well.  The other group at the bottom is entirely up to you to use as you see fit  for specific symptoms that may arise between visits that require you to treat them on an as needed basis.  Think of this as your action plan or "what if" list.   Separating the top medications from the bottom group is fundamental to providing you adequate care going forward.    See Tammy in 2 weeks with all pill bottles in separate bags and your pill box organizer to be sure it matches up accurately to what we think you're taking

## 2011-07-04 NOTE — Progress Notes (Signed)
Subjective:     Patient ID: Carrie [name], female   DOB: 02/24/1941, 70 y.o.   MRN: [id number]  HPI  48 yowf never smoker c/o cough since 2000 day > night, dry > wet and exacerbated during times when she has URI. Former workup included allergy testing that showed only mild positivity and no response immunotherapy 1990s by Dr. Corinda Gubler, negative FOB in 2004, and only controlled as inpt with outpt nonadherence documented repeatedly.   08/02/07 c/o 3 months worsening dyspnea after exac of cough stopped her from exercising. when asked if anything it ever help the cough she said no. In fact the record indicates complete resolution during hospitalization 2004.   Seen 12/11/07 50% improvement, given prednisone taper and started on Symbicort 80/4.60mcg 2 pufs two times a day.   01/01/08 cough completely resolved but still doe x anything more than slow adl's. overall though admits these she is the best she's been in years . Increased symbicort to 160/4.5 2 two times a day   March 11, 2008 increased sob but only used neb 3 x in last month, no noct co's, no overt hb. Rec try off reglan to reduce risk of longterm side effects-  April 30, 2008 ov : worse x 2 weeks with increase dyspnea > cough, minimally productive. --started back on reglan, -referred to GI for follow up reflux   September 07, 2009-- med review. last visit w/ flare tx w/ steroids. IMproved back to baseline. rec d/c reglan due to shaking     January 08, 2010 ov 1 month followup. Pt c/o increased SOB since more humid weather. She states she is "in and out" all day running errands and doing yardwork and gets out of breath easily. She also c/o frequent need to clear throat and has some prod cough with white sputum. brought meds and med calendar with multiple inconsistencies. not doing as well on symbicort in terms of symptom control. no noct or early am flares --Changed  to budesonide/brovana neb.   January 21, 2010--Presents for follow up and med  review. She was changed back to Budesonide and Brovana last visit. She is feeling better -best she has been in a while. She has brought all her meds. We organized them and updated her med calendar. She got assistance with her neb meds and now can afford them. She is feeling some better, has been under alot of stress w/ recent death of son and daughter is sick alot w/ skin condition. on clindamycin and erthromycin too > Thrush on neb > no better with magic mouthwash > started dlflucan   11/23/10 ov/NP doing better, new med calendar, multiple changes in chronic meds rec follow calendar, no change resp rx  02/07/2011  f/u ov/Carrie [name] cc mostly dry cough worse x one month day > night, does not wake up her up. Feels needs to use the proaire needed w/in an hour of the neb. Not following prns at bottom of calendar despite mulitple documented past attempts to help her self manage the problem rec  Follow the med calendar. Remember to use the zegrid before breakfast and at bedtime Push mucinex dm to maxiumum with flutter valve with coughing  Prednisone 10 mg take  4 each am x 2 days,   2 each am x 2 days,  1 each am x2days and stop    03/08/2011 f/u ov/Carrie [name] cc  Cough > sob but neither better, not using calendar as instructed re use of prns listed as specific action plans  with max doses listed in a very user friendly format, esp meds intended to help with cough, no response to chortrimeton. >>decreased lorazepam and encouraged flutter valve   03/22/2011 Follow Up  Pt  Returns for follow up and med review. She feels that her breathing has improved slightly since last visit. Last ov recommended to maximize her mucinex dm and flutter valve. She says cough is much better. We reviewed her meds and updated her med calendar w/ pt education  No discolored mucus or fever.  rec No change, follow calendar  07/04/2011 f/u ov/Carrie [name] cc cough worse x 2 weeks esp at hs, white mucus not using nasal steroids as directed on action  plan part of calendar.  No change mild chronic doe or hfa use daytime.  Sleeping ok without nocturnal  or early am exacerbation  of respiratory  c/o's or need for noct saba. Also denies any obvious fluctuation of symptoms with weather or environmental changes or other aggravating or alleviating factors except as outlined above   ROS  At present neg for  any significant sore throat, dysphagia, itching, sneezing,  nasal congestion or excess/ purulent secretions,  fever, chills, sweats, unintended wt loss, pleuritic or exertional cp, hempoptysis, orthopnea pnd or leg swelling.  Also denies presyncope, palpitations, heartburn, abdominal pain, nausea, vomiting, diarrhea  or change in bowel or urinary habits, dysuria,hematuria,  rash, arthralgias, visual complaints, headache, numbness weakness or ataxia.        Allergies    1) ! Asa  2) ! Codeine  3) ! Sulfa  4) ! Morphine  5) ! Pcn  6) ! Reglan (Metoclopramide Hcl)     Past Surgical History:  Appendectomy  Tubal Ligation  Cystic surgery on breast-bilateral  Lt. Ankle Surgery    Family History:  mother died at 47 from heart attack  father died from heart attack  sister 1 died from cirrhosis of liver and subsequent staff infection  sister 2 is a diabetic  sister 3 has hx of heart attack and stents  brother has hx of heart disease and diabetes  No FH of Colon Cancer:  Family History of Breast Cancer:mother  Family History of Ovarian Cancer: Sister x 2  Family History of Colon Polyps: Mother    Social History:  she has never smoked was previously exposed to her husband's cigarettes  occasionally exercises  no caffeine  no etoh  married  2 children  Husband on hospice     Past Medical History:  COUGH (ICD-786.2) onset about 49  - Flared off Reglan April 30, 2008  - Changed Reglan > erythromcyin per Arlyce Dice 08/2008  - Add back reglan December 26, 2008 >>reglan stopped intolerant  - Sinus ct 03/17/09 nl  -CT chest 11/08  scattered pulm nodules, 5/10 stable pulm nodules (never smoker)  Hyperlipidemia  Anxiety  Chronic Asthma, severe..............................................................................Marland KitchenWert  - FEV1 29%, ratio 43% with 16% improvement after bronchodilators 11/02/2007  - FEV1 33%, ratio 47, with 14% improvment after B2 July 13, 2009 with DLC0 59> 125% corrected  - [facility] July 13, 2009 50 %> 100% August 13, 2009 > 75% January 08, 2010  Complex med regimen- med calendar updated May 22, 2008 , .January 12, 2009, January 21, 2010 , 05/24/10 , 03/22/2011  - Poor correlation between calendar and bags of meds January 08, 2010               Objective:   Physical Exam     wt 193 September 22, 2008  >  175 June 24, 2010 >177 03/08/2011 >>176 03/22/2011 >  179 07/04/2011  HEENT mild nonspecific turbinate edema. Oropharynx no thrush or excess pnd or cobblestoning. No JVD or cervical adenopathy. Mild accessory muscle hypertrophy. Trachea midline, nl thryroid. Chest decreased BS in base, no wheezing, Regular rate and rhythm without murmur gallop or rub or increase P2 or edema. Abd: no hsm, nl excursion. Ext warm without cyanosis or clubbing.  Assessment:         Plan:

## 2011-07-04 NOTE — Assessment & Plan Note (Signed)
The most common causes of chronic cough in immunocompetent adults include the following: upper airway cough syndrome (UACS), previously referred to as postnasal drip syndrome (PNDS), which is caused by variety of rhinosinus conditions; (2) asthma; (3) GERD; (4) chronic bronchitis from cigarette smoking or other inhaled environmental irritants; (5) nonasthmatic eosinophilic bronchitis; and (6) bronchiectasis.   These conditions, singly or in combination, have accounted for up to 94% of the causes of chronic cough in prospective studies.   Other conditions have constituted no >6% of the causes in prospective studies These have included bronchogenic carcinoma, chronic interstitial pneumonia, sarcoidosis, left ventricular failure, ACEI-induced cough, and aspiration from a condition associated with pharyngeal dysfunction.  .Chronic cough is often simultaneously caused by more than one condition. A single cause has been found from 38 to 82% of the time, multiple causes from 18 to 62%. Multiply caused cough has been the result of three diseases up to 42% of the time.    Will max rx for Chronic rhinitis/ continue treatment for asthma and GERD.    Each maintenance medication was reviewed in detail including most importantly the difference between maintenance and as needed and under what circumstances the prns are to be used.  This was done in the context of a medication calendar review which provided the patient with a user-friendly unambiguous mechanism for medication administration and reconciliation and provides an action plan for all active problems. It is critical that this be shown to every doctor  for modification during the office visit if necessary so the patient can use it as a working document.

## 2011-07-04 NOTE — Assessment & Plan Note (Signed)
Extensive discussion of optimal treatment with topical steroids and approp use of afrin  See instructions for specific recommendations which were reviewed directly with the patient who was given a copy with highlighter outlining the key components.

## 2011-07-04 NOTE — Assessment & Plan Note (Signed)
All goals of chronic asthma control met including optimal function and elimination of symptoms with minimal need for rescue therapy.  Contingencies discussed in full including contacting this office immediately if not controlling the symptoms using the rule of two's.    

## 2011-07-05 ENCOUNTER — Other Ambulatory Visit: Payer: Self-pay | Admitting: Neurology

## 2011-07-05 ENCOUNTER — Other Ambulatory Visit (HOSPITAL_COMMUNITY): Payer: Self-pay | Admitting: Neurology

## 2011-07-05 DIAGNOSIS — R413 Other amnesia: Secondary | ICD-10-CM

## 2011-07-05 DIAGNOSIS — R42 Dizziness and giddiness: Secondary | ICD-10-CM

## 2011-07-08 ENCOUNTER — Inpatient Hospital Stay (HOSPITAL_COMMUNITY): Admission: RE | Admit: 2011-07-08 | Payer: Medicare Other | Source: Ambulatory Visit

## 2011-07-18 ENCOUNTER — Other Ambulatory Visit: Payer: Self-pay | Admitting: Neurology

## 2011-07-18 DIAGNOSIS — R42 Dizziness and giddiness: Secondary | ICD-10-CM

## 2011-07-18 DIAGNOSIS — R413 Other amnesia: Secondary | ICD-10-CM

## 2011-07-19 ENCOUNTER — Ambulatory Visit (HOSPITAL_COMMUNITY)
Admission: RE | Admit: 2011-07-19 | Discharge: 2011-07-19 | Disposition: A | Discharge status: Home / Self Care | Payer: Medicare Other | Source: Ambulatory Visit | Attending: Neurology | Admitting: Neurology

## 2011-07-19 DIAGNOSIS — F29 Unspecified psychosis not due to a substance or known physiological condition: Secondary | ICD-10-CM | POA: Insufficient documentation

## 2011-07-19 DIAGNOSIS — R51 Headache: Secondary | ICD-10-CM | POA: Insufficient documentation

## 2011-07-19 DIAGNOSIS — R42 Dizziness and giddiness: Secondary | ICD-10-CM | POA: Insufficient documentation

## 2011-07-19 DIAGNOSIS — R413 Other amnesia: Secondary | ICD-10-CM

## 2011-07-19 NOTE — Progress Notes (Signed)
Bilateral carotid artery duplex and TCD completed.  Preliminary report is 60-79% ICA stenosis on the right, probably due to vessel tortuosity, and no evidence of significant stenosis on the left.

## 2011-07-21 ENCOUNTER — Ambulatory Visit (INDEPENDENT_AMBULATORY_CARE_PROVIDER_SITE_OTHER): Payer: Medicare Other | Admitting: Adult Health

## 2011-07-21 ENCOUNTER — Encounter: Payer: Self-pay | Admitting: Adult Health

## 2011-07-21 VITALS — BP 122/82 | HR 85 | Temp 97.9°F | Ht 66.5 in | Wt 178.4 lb

## 2011-07-21 DIAGNOSIS — J45909 Unspecified asthma, uncomplicated: Secondary | ICD-10-CM

## 2011-07-21 NOTE — Progress Notes (Signed)
Subjective:     Patient ID: Carrie [name], female   DOB: [birth year], 71 y.o.   MRN: [id number]  HPI  58 yowf never smoker c/o cough since 2000 day > night, dry > wet and exacerbated during times when she has URI. Former workup included allergy testing that showed only mild positivity and no response immunotherapy 1990s by Dr. Corinda Gubler, negative FOB in 2004, and only controlled as inpt with outpt nonadherence documented repeatedly.   08/02/07 c/o 3 months worsening dyspnea after exac of cough stopped her from exercising. when asked if anything it ever help the cough she said no. In fact the record indicates complete resolution during hospitalization 2004.   Seen 12/11/07 50% improvement, given prednisone taper and started on Symbicort 80/4.10mcg 2 pufs two times a day.   01/01/08 cough completely resolved but still doe x anything more than slow adl's. overall though admits these she is the best she's been in years . Increased symbicort to 160/4.5 2 two times a day   March 11, 2008 increased sob but only used neb 3 x in last month, no noct co's, no overt hb. Rec try off reglan to reduce risk of longterm side effects-  April 30, 2008 ov : worse x 2 weeks with increase dyspnea > cough, minimally productive. --started back on reglan, -referred to GI for follow up reflux   September 07, 2009-- med review. last visit w/ flare tx w/ steroids. IMproved back to baseline. rec d/c reglan due to shaking     January 08, 2010 ov 1 month followup. Pt c/o increased SOB since more humid weather. She states she is "in and out" all day running errands and doing yardwork and gets out of breath easily. She also c/o frequent need to clear throat and has some prod cough with white sputum. brought meds and med calendar with multiple inconsistencies. not doing as well on symbicort in terms of symptom control. no noct or early am flares --Changed  to budesonide/brovana neb.   January 21, 2010--Presents for follow up and med  review. She was changed back to Budesonide and Brovana last visit. She is feeling better -best she has been in a while. She has brought all her meds. We organized them and updated her med calendar. She got assistance with her neb meds and now can afford them. She is feeling some better, has been under alot of stress w/ recent death of son and daughter is sick alot w/ skin condition. on clindamycin and erthromycin too > Thrush on neb > no better with magic mouthwash > started dlflucan   11/23/10 ov/NP doing better, new med calendar, multiple changes in chronic meds rec follow calendar, no change resp rx  02/07/2011  f/u ov/Wert cc mostly dry cough worse x one month day > night, does not wake up her up. Feels needs to use the proaire needed w/in an hour of the neb. Not following prns at bottom of calendar despite mulitple documented past attempts to help her self manage the problem rec  Follow the med calendar. Remember to use the zegrid before breakfast and at bedtime Push mucinex dm to maxiumum with flutter valve with coughing  Prednisone 10 mg take  4 each am x 2 days,   2 each am x 2 days,  1 each am x2days and stop    03/08/2011 f/u ov/Wert cc  Cough > sob but neither better, not using calendar as instructed re use of prns listed as specific action plans  with max doses listed in a very user friendly format, esp meds intended to help with cough, no response to chortrimeton. >>decreased lorazepam and encouraged flutter valve   03/22/2011 Follow Up  Pt  Returns for follow up and med review. She feels that her breathing has improved slightly since last visit. Last ov recommended to maximize her mucinex dm and flutter valve. She says cough is much better. We reviewed her meds and updated her med calendar w/ pt education  No discolored mucus or fever.  rec No change, follow calendar  07/04/2011 f/u ov/Wert cc cough worse x 2 weeks esp at hs, white mucus not using nasal steroids as directed on action  plan part of calendar.  No change mild chronic doe or hfa use daytime. >>steroid taper   07/21/2011 Follow up and med review Pt returns for follow up and med review. We reviewed all her meds and organized them into a med calendar today with pt education . It appear she is taking her meds correctly.  Last ov she had a flare of her asthma , tx w/ steroid taper-now is improved.  No chest pain or edema.  She is feeling better and back to her baseline.   ROS   Constitutional:   No  weight loss, night sweats,  Fevers, chills,  +fatigue, or  lassitude.  HEENT:   No headaches,  Difficulty swallowing,  Tooth/dental problems, or  Sore throat,                No sneezing, itching, ear ache, nasal congestion, post nasal drip,   CV:  No chest pain,  Orthopnea, PND, swelling in lower extremities, anasarca, dizziness, palpitations, syncope.   GI  No heartburn, indigestion, abdominal pain, nausea, vomiting, diarrhea, change in bowel habits, loss of appetite, bloody stools.   Resp:  No coughing up of blood.    No chest wall deformity  Skin: no rash or lesions.  GU: no dysuria, change in color of urine, no urgency or frequency.  No flank pain, no hematuria   MS:  No joint pain or swelling.  No decreased range of motion.  No back pain.  Psych:  No change in mood or affect. No depression or anxiety.  No memory loss.            Allergies    1) ! Asa  2) ! Codeine  3) ! Sulfa  4) ! Morphine  5) ! Pcn  6) ! Reglan (Metoclopramide Hcl)     Past Surgical History:  Appendectomy  Tubal Ligation  Cystic surgery on breast-bilateral  Lt. Ankle Surgery    Family History:  mother died at 1 from heart attack  father died from heart attack  sister 1 died from cirrhosis of liver and subsequent staff infection  sister 2 is a diabetic  sister 3 has hx of heart attack and stents  brother has hx of heart disease and diabetes  No FH of Colon Cancer:  Family History of Breast Cancer:mother    Family History of Ovarian Cancer: Sister x 2  Family History of Colon Polyps: Mother    Social History:  she has never smoked was previously exposed to her husband's cigarettes  occasionally exercises  no caffeine  no etoh  married  2 children  Husband on hospice     Past Medical History:  COUGH (ICD-786.2) onset about 70  - Flared off Reglan April 30, 2008  - Changed Reglan > erythromcyin per Arlyce Dice 08/2008  -  Add back reglan December 26, 2008 >>reglan stopped intolerant  - Sinus ct 03/17/09 nl  -CT chest 11/08 scattered pulm nodules, 5/10 stable pulm nodules (never smoker)  Hyperlipidemia  Anxiety  Chronic Asthma, severe..............................................................................Marland KitchenWert  - FEV1 29%, ratio 43% with 16% improvement after bronchodilators 11/02/2007  - FEV1 33%, ratio 47, with 14% improvment after B2 July 13, 2009 with DLC0 59> 125% corrected  - [facility] July 13, 2009 50 %> 100% August 13, 2009 > 75% January 08, 2010  Complex med regimen- med calendar updated May 22, 2008 , .January 12, 2009, January 21, 2010 , 05/24/10 , 03/22/2011 , 07/21/2011  - Poor correlation between calendar and bags of meds January 08, 2010               Objective:   Physical Exam     wt 193 September 22, 2008  > 175 June 24, 2010 >177 03/08/2011 >>176 03/22/2011 >  179 07/04/2011 >178 07/21/2011  HEENT mild nonspecific turbinate edema. Oropharynx no thrush or excess pnd or cobblestoning. No JVD or cervical adenopathy. Mild accessory muscle hypertrophy. Trachea midline, nl thryroid. Chest decreased BS in base, no wheezing, Regular rate and rhythm without murmur gallop or rub or increase P2 or edema. Abd: no hsm, nl excursion. Ext warm without cyanosis or clubbing.  Assessment:         Plan:

## 2011-07-21 NOTE — Patient Instructions (Signed)
Follow med calendar closely and bring to each visit.  Continue on current regimen .  follow up Dr. Wert  In 3 months and As needed   

## 2011-07-21 NOTE — Assessment & Plan Note (Addendum)
Compensated on present regimen.  Patient's medications were reviewed today and patient education was given. Computerized medication calendar was adjusted/completed Cont on current regimen follow up Dr. Sherene Sires  In 3 months

## 2011-07-26 MED ORDER — DEXTROMETHORPHAN-GUAIFENESIN 20-400 MG PO TABS: ORAL_TABLET | ORAL | Status: DC

## 2011-07-26 MED ORDER — TRAMADOL HCL 50 MG PO TABS: 50.0000 mg | ORAL_TABLET | ORAL | Status: DC | PRN

## 2011-07-26 MED ORDER — OMEPRAZOLE-SODIUM BICARBONATE 20-1100 MG PO CAPS: ORAL_CAPSULE | ORAL | Status: DC

## 2011-07-26 MED ORDER — LORAZEPAM 1 MG PO TABS: ORAL_TABLET | ORAL | Status: DC

## 2011-07-26 NOTE — Progress Notes (Signed)
Addended by: Boone Master E on: 07/26/2011 12:50 PM   Modules accepted: Orders

## 2011-08-16 ENCOUNTER — Other Ambulatory Visit: Payer: Self-pay

## 2011-08-16 ENCOUNTER — Emergency Department (HOSPITAL_COMMUNITY)
Admission: EM | Admit: 2011-08-16 | Discharge: 2011-08-16 | Disposition: A | Discharge status: Home / Self Care | Payer: Medicare Other | Attending: Emergency Medicine | Admitting: Emergency Medicine

## 2011-08-16 ENCOUNTER — Encounter (HOSPITAL_COMMUNITY): Payer: Self-pay | Admitting: *Deleted

## 2011-08-16 DIAGNOSIS — E785 Hyperlipidemia, unspecified: Secondary | ICD-10-CM | POA: Insufficient documentation

## 2011-08-16 DIAGNOSIS — F4321 Adjustment disorder with depressed mood: Secondary | ICD-10-CM | POA: Insufficient documentation

## 2011-08-16 DIAGNOSIS — R55 Syncope and collapse: Secondary | ICD-10-CM | POA: Insufficient documentation

## 2011-08-16 DIAGNOSIS — Z79899 Other long term (current) drug therapy: Secondary | ICD-10-CM | POA: Insufficient documentation

## 2011-08-16 DIAGNOSIS — J45909 Unspecified asthma, uncomplicated: Secondary | ICD-10-CM | POA: Insufficient documentation

## 2011-08-16 HISTORY — DX: Headache: R51

## 2011-08-16 HISTORY — DX: Headache, unspecified: R51.9

## 2011-08-16 LAB — URINE MICROSCOPIC-ADD ON

## 2011-08-16 LAB — URINALYSIS, ROUTINE W REFLEX MICROSCOPIC
Bilirubin Urine: NEGATIVE
Glucose, UA: NEGATIVE mg/dL
Hgb urine dipstick: NEGATIVE
Ketones, ur: NEGATIVE mg/dL
Nitrite: NEGATIVE
Protein, ur: NEGATIVE mg/dL
Specific Gravity, Urine: 1.01 (ref 1.005–1.030)
Urobilinogen, UA: 0.2 mg/dL (ref 0.0–1.0)
pH: 7 (ref 5.0–8.0)

## 2011-08-16 LAB — BASIC METABOLIC PANEL
BUN: 11 mg/dL (ref 6–23)
CO2: 31 mEq/L (ref 19–32)
Calcium: 9.4 mg/dL (ref 8.4–10.5)
Chloride: 103 mEq/L (ref 96–112)
Creatinine, Ser: 0.73 mg/dL (ref 0.50–1.10)
GFR calc Af Amer: >90 mL/min (ref >90)
GFR calc non Af Amer: 85 mL/min — ABNORMAL LOW (ref >90)
Glucose, Bld: 100 mg/dL — ABNORMAL HIGH (ref 70–99)
Potassium: 4.3 mEq/L (ref 3.5–5.1)
Sodium: 140 mEq/L (ref 135–145)

## 2011-08-16 LAB — CBC
HCT: 38.2 % (ref 36.0–46.0)
Hemoglobin: 12.2 g/dL (ref 12.0–15.0)
MCH: 29.6 pg (ref 26.0–34.0)
MCHC: 31.9 g/dL (ref 30.0–36.0)
MCV: 92.7 fL (ref 78.0–100.0)
Platelets: 268 10*3/uL (ref 150–400)
RBC: 4.12 MIL/uL (ref 3.87–5.11)
RDW: 13.8 % (ref 11.5–15.5)
WBC: 9.2 10*3/uL (ref 4.0–10.5)

## 2011-08-16 LAB — TROPONIN I: Troponin I: <0.3 ng/mL (ref <0.30)

## 2011-08-16 MED ORDER — ACETAMINOPHEN 500 MG PO TABS
1000.0000 mg | ORAL_TABLET | Freq: Once | ORAL | Status: AC
  Administered 2011-08-16: 1000 mg via ORAL
  Filled 2011-08-16: qty 2

## 2011-08-16 MED ORDER — IBUPROFEN 400 MG PO TABS: 400.0000 mg | ORAL_TABLET | Freq: Once | ORAL | Status: DC

## 2011-08-16 MED ORDER — IBUPROFEN 400 MG PO TABS
ORAL_TABLET | ORAL | Status: AC
  Filled 2011-08-16: qty 1

## 2011-08-16 MED ORDER — SODIUM CHLORIDE 0.9 % IV BOLUS (SEPSIS): 1000.0000 mL | Freq: Once | INTRAVENOUS | Status: DC

## 2011-08-16 NOTE — ED Notes (Signed)
Pt with syncopal episode, pt not eating or sleeping recently due to husband dying of cancer and is at Hospice house currently and dying at this time, pt encouraged to come here for evaluation and really did not want to come here

## 2011-08-16 NOTE — Discharge Instructions (Signed)
Grief Reaction Grief is a normal response to the death of someone close to you. Feelings of fear, anger, and guilt can affect almost everyone who loses someone they love. Symptoms of depression are also common. These include problems with sleep, loss of appetite, and lack of energy. These grief reaction symptoms often last for weeks to months after a loss. They may also return during special times that remind you of the person you lost, such as an anniversary or birthday. Anxiety, insomnia, irritability, and deep depression may last beyond the period of normal grief. If you experience these feelings for 6 months or longer, you may have clinical depression. Clinical depression requires further medical attention. If you think that you have clinical depression, you should contact your caregiver. If you have a history of depression and or a family history of depression, you are at greater risk of clinical depression. You are also at greater risk of developing clinical depression if the loss was traumatic or the loss was of someone with whom you had unresolved issues.  A grief reaction can become complicated by being blocked. This means being unable to cry or express extreme emotions. This may prolong the grieving period and worsen the emotional effects of the loss. Mourning is a natural event in human life. A healthy grief reaction is one that is not blocked . It requires a time of sadness and readjustment.It is very important to share your sorrow and fear with others, especially close friends and family. Professional counselors and clergy can also help you process your grief. Document Released: 06/06/2005 Document Revised: 02/16/2011 Document Reviewed: 02/14/2006 [facility] Patient Information 2012 [place], [place].Emotional Crisis Part of your problem today may be due to an emotional crisis. Emotional states can cause many different physical signs and symptoms. These may include:  Chest or stomach pain.    Fluttering heartbeat.   Passing out.   Breathing difficulty.   Headaches.   Trembling.   Hot or cold flashes.   Numbness.   Dizziness.   Unusual muscle pain or fatigue.   Insomnia.  When you have other medical problems, they are often made worse by emotional upsets. Emotional crises can increase your stress and anxiety. Finding ways to reduce your stress level can make you feel better. You will become more capable of dealing with these emotional states. Regular physical exercise such as walking can be very beneficial. Counseling or medicine to treat anxiety or depression may also be needed. See your caregiver if you have further problems or questions about your condition. Document Released: 06/06/2005 Document Revised: 02/16/2011 Document Reviewed: 11/21/2006 [facility] Patient Information 2012 [place], [place].Syncope You have had a fainting (syncopal) spell. A fainting episode is a sudden, short-lived loss of consciousness. It results in complete recovery. It occurs because there has been a temporary shortage of oxygen and/or sugar (glucose) to the brain. CAUSES   Blood pressure pills and other medications that may lower blood pressure below normal. Sudden changes in posture (sudden standing).   Over-medication. Take your medications as directed.   Standing too long. This can cause blood to pool in the legs.   Seizure disorders.   Low blood sugar (hypoglycemia) of diabetes. This more commonly causes coma.   Bearing down to go to the bathroom. This can cause your blood pressure to rise suddenly. Your body compensates by making the blood pressure too low when you stop bearing down.   Hardening of the arteries where the brain temporarily does not receive enough blood.  Irregular heart beat and circulatory problems.   Fear, emotional distress, injury, sight of blood, or illness.  Your caregiver will send you home if the syncope was from non-worrisome causes (benign).  Depending on your age and health, you may stay to be monitored and observed. If you return home, have someone stay with you if your caregiver feels that is desirable. It is very important to keep all follow-up referrals and appointments in order to properly manage this condition. This is a serious problem which can lead to serious illness and death if not carefully managed.  WARNING: Do not drive or operate machinery until your caregiver feels that it is safe for you to do so. SEEK IMMEDIATE MEDICAL CARE IF:   You have another fainting episode or faint while lying or sitting down. DO NOT DRIVE YOURSELF. Call 911 if no other help is available.   You have chest pain, are feeling sick to your stomach (nausea), vomiting or abdominal pain.   You have an irregular heartbeat or one that is very fast (pulse over 120 beats per minute).   You have a loss of feeling in some part of your body or lose movement in your arms or legs.   You have difficulty with speech, confusion, severe weakness, or visual problems.   You become sweaty and/or feel light headed.  Make sure you are rechecked as instructed. Document Released: 06/06/2005 Document Revised: 02/16/2011 Document Reviewed: 01/25/2007 [facility] Patient Information 2012 [place], [place].  RESOURCE GUIDE  Dental Problems  Patients with Medicaid: [facility] [phone] [address]                                           [id number] [address] [employer] [address] Phone:  [phone]                                                  Phone:  [phone]  If unable to pay or uninsured, contact:  [facility] or [facility]. to become qualified for the adult dental clinic.  Chronic Pain Problems Contact [facility]  [phone] Patients need to be referred by their primary care doctor.  Insufficient Money for Medicine Contact [employer] Way:  call "211" or [facility] [phone].  No  Primary Care Doctor Call [facility]  [phone] Other agencies that provide inexpensive medical care    [facility]  [phone]    [facility]  [phone]    [facility]  [phone]    [facility]  [phone]    [facility]  [phone]    [facility]  [phone]  Psychological Services [facility]  [phone] [facility] Services  [phone] [facility]   [phone] (emergency services [phone])  Substance Abuse Resources [facility] Services  [phone] [facility] [phone] [facility] [phone] Floydene Flock [phone] Residential & Outpatient Substance Abuse Program  928-365-6360  Abuse/Neglect [facility] Child Abuse Hotline 606-399-7369 [facility] Child Abuse Hotline 661-871-0761 (After Hours)  Emergency Shelter [facility] [phone]  Maternity  Homes Room at the [place] [facility] [phone] [facility] [phone]  MRSA Hotline #:   313-338-7620    [facility] Resources  [facility] [place]     [employer]                          [facility] Dept. [address] [place]                       [address]      [address]  Blondell Reveal Phone:  [phone]                                   Phone:  [phone]                 Phone:  [phone]  [facility] Mental Health Phone:  [phone]  [facility] Child Abuse Hotline 323-548-1630 [phone] (After Hours)

## 2011-08-16 NOTE — ED Provider Notes (Signed)
History    71yf presenting after near syncopal event. Pt was visiting with husband who is dying. Got up and began feeling very lightheaded. Was helped to ground by family. Did not hit head. May have briefly lost consciousness. Currently denies pain anywhere. No sob. No palpitations. Says just wants to leave to go back to husband. Had to be strongly encouraged to come for eval. Has not been eating or sleeping well recently given being with husband. Recently began taking benzos as well to help with grieving/stress.  CSN: [id number]  Arrival date & time 08/16/11  1352   First MD Initiated Contact with Patient 08/16/11 1358      Chief Complaint  Patient presents with  . Loss of Consciousness    (Consider location/radiation/quality/duration/timing/severity/associated sxs/prior treatment) HPI  Past Medical History  Diagnosis Date  . Cough   . Hyperlipidemia   . Anxiety   . Chronic asthma   . Atherosclerosis of artery   . Persistent headaches     Past Surgical History  Procedure Date  . Appendectomy   . Tubal ligation   . Cystic surgery on breast     bilateral  . Left ankle surgery   . Shoulder surgery 06/2010    bone spurs    Family History  Problem Relation Age of Onset  . Heart attack Mother   . Heart attack Father   . Cirrhosis Sister   . Diabetes Sister   . Heart attack Sister   . Heart disease Brother   . Diabetes Brother   . Breast cancer Mother   . Ovarian cancer Sister   . Ovarian cancer Sister   . Colon polyps Mother     History  Substance Use Topics  . Smoking status: Never Smoker   . Smokeless tobacco: Not on file   Comment: exposed to 2nd hand smoke  . Alcohol Use: No    OB History    Grav Para Term Preterm Abortions TAB SAB Ect Mult Living                  Review of Systems   Review of symptoms negative unless otherwise noted in HPI.   Allergies  Aspirin; Codeine; Metoclopramide hcl; Morphine; Penicillins; and Sulfonamide  derivatives  Home Medications   Current Outpatient Rx  Name Route Sig Dispense Refill  . ALBUTEROL SULFATE HFA 108 (90 BASE) MCG/ACT IN AERS  Inhale 2 puffs every 4-6 hours as needed for wheezing / shortness of breath    . ARFORMOTEROL TARTRATE 15 MCG/2ML IN NEBU Nebulization Take 15 mcg by nebulization 2 (two) times daily.      . CYCLOBENZAPRINE HCL 10 MG PO TABS Oral Take 10 mg by mouth 3 (three) times daily as needed. For muscle spasms    . DEXTROMETHORPHAN-GUAIFENESIN 20-400 MG PO TABS Oral Take 1 tablet by mouth 2 (two) times daily. (PLAN B  for cough and congestion)    . EZETIMIBE 10 MG PO TABS Oral Take 10 mg by mouth at bedtime.    [name] GABAPENTIN 100 MG PO CAPS Oral Take 300 mg by mouth at bedtime.    [name] LORAZEPAM 1 MG PO TABS Oral Take 1 mg by mouth 3 (three) times daily.    [name] MAGNESIUM HYDROXIDE 400 MG/5ML PO SUSP Oral Take by mouth daily as needed. Per bottle when needed for constipation    . ADULT MULTIVITAMIN W/MINERALS CH Oral Take 1 tablet by mouth daily.    [name] OMEPRAZOLE-SODIUM BICARBONATE 20-1100 MG PO CAPS  Take 1 capsule by mouth 30 minutes before 1st meal of the day and at bedtime    . RALOXIFENE HCL 60 MG PO TABS Oral Take 60 mg by mouth daily.      . TORSEMIDE 20 MG PO TABS Oral Take 20 mg by mouth daily.      . TRAMADOL-ACETAMINOPHEN 37.5-325 MG PO TABS Oral Take 1 tablet by mouth every 6 (six) hours as needed. For pain    . VENLAFAXINE HCL ER 75 MG PO CP24  2 tablets at bedtime      BP 143/86  Pulse 85  Temp(Src) 97.8 F (36.6 C) (Oral)  Resp 19  Ht 5\' 6"  (1.676 m)  Wt 169 lb (76.658 kg)  BMI 27.28 kg/m2  SpO2 99%  Physical Exam  Nursing note and vitals reviewed. Constitutional: She appears well-developed and well-nourished. No distress.       Laying in bed. nad.  HENT:  Head: Normocephalic and atraumatic.  Right Ear: External ear normal.  Left Ear: External ear normal.  Eyes: Conjunctivae are normal. Pupils are equal, round, and reactive to light. Right  eye exhibits no discharge. Left eye exhibits no discharge.  Neck: Normal range of motion. Neck supple.  Cardiovascular: Normal rate, regular rhythm and normal heart sounds.  Exam reveals no gallop and no friction rub.   No murmur heard. Pulmonary/Chest: Effort normal and breath sounds normal. No respiratory distress. She has no wheezes. She has no rales.  Abdominal: Soft. She exhibits no distension. There is no tenderness.  Musculoskeletal: She exhibits no edema and no tenderness.  Lymphadenopathy:    She has no cervical adenopathy.  Neurological: She is alert.  Skin: Skin is warm and dry. She is not diaphoretic.  Psychiatric: Her behavior is normal. Thought content normal.       Flat affect    ED Course  Procedures (including critical care time)  Labs Reviewed  BASIC METABOLIC PANEL - Abnormal; Notable for the following:    Glucose, Bld 100 (*)    GFR calc non Af Amer 85 (*)    All other components within normal limits  URINALYSIS, ROUTINE W REFLEX MICROSCOPIC - Abnormal; Notable for the following:    Leukocytes, UA SMALL (*)    All other components within normal limits  URINE MICROSCOPIC-ADD ON - Abnormal; Notable for the following:    Squamous Epithelial / LPF FEW (*)    All other components within normal limits  TROPONIN I  CBC  LAB REPORT - SCANNED   No results found.  EKG:  Rhythm: normal sinus Rate: 83 Axis: normal Intervals:normal ST segments: normal  1. Syncope   2. Grief       MDM  71yf with syncope vs near syncope. Suspect related to high level of stress pt is currently under combined with sleep deprivation and poor po intake. Medications may contributing as well. W/u today relatively unremarkable. Return precautions discussed. Benign dc'd with daughter. outpt fu.        Raeford Razor, MD 08/18/11 [phone]

## 2011-08-16 NOTE — ED Notes (Signed)
Reported when pt passed out, someone caught her and eased her to floor; pt admits to being under a lot of stress due to condition of husband

## 2011-10-11 ENCOUNTER — Ambulatory Visit: Payer: Self-pay | Admitting: Vascular Surgery

## 2011-10-12 ENCOUNTER — Ambulatory Visit: Payer: Medicare Other | Admitting: Internal Medicine

## 2011-11-02 ENCOUNTER — Telehealth: Payer: Self-pay | Admitting: Adult Health

## 2011-11-02 ENCOUNTER — Telehealth: Payer: Self-pay | Admitting: Internal Medicine

## 2011-11-02 NOTE — Telephone Encounter (Signed)
appt made for fri with dr wert at 8:45. Ok to use this slot per TD (epic had given a msg that dr wert wasn't avail at that time and I didn't realize that as long as it showed this open I could use it. I spoke to daughter who will bring pt in fri. Daughter says pt was "anxious" because she has lost her med list and was afraid MW wouldn't see her. I advised daughter to go through ALL of pt's meds and bring them with her AND make a list to bring as well. Nothing further needed at this time per daughter. Hazel Sams

## 2011-11-02 NOTE — Telephone Encounter (Signed)
I spoke with cindy and she states pt has been having some SOB and coughing a lot. Pt is scheduled to come in and see KC on Friday at 9:30 for an evaluation. Per Arline Asp they will wait for refill on neb med when she comes in on Friday. Aware if pt worsens before then to seek emergency care

## 2011-11-03 NOTE — Telephone Encounter (Signed)
See other phone note dated 11/02/11

## 2011-11-04 ENCOUNTER — Telehealth: Payer: Self-pay | Admitting: Internal Medicine

## 2011-11-04 ENCOUNTER — Ambulatory Visit (INDEPENDENT_AMBULATORY_CARE_PROVIDER_SITE_OTHER): Payer: Medicare Other | Admitting: Internal Medicine

## 2011-11-04 ENCOUNTER — Other Ambulatory Visit: Payer: Self-pay | Admitting: Internal Medicine

## 2011-11-04 ENCOUNTER — Ambulatory Visit: Payer: Medicare Other | Admitting: Pulmonary Disease

## 2011-11-04 ENCOUNTER — Encounter: Payer: Self-pay | Admitting: Internal Medicine

## 2011-11-04 VITALS — BP 110/80 | HR 80 | Temp 97.8°F | Ht 66.5 in | Wt 187.8 lb

## 2011-11-04 DIAGNOSIS — R05 Cough: Secondary | ICD-10-CM

## 2011-11-04 DIAGNOSIS — J45909 Unspecified asthma, uncomplicated: Secondary | ICD-10-CM

## 2011-11-04 MED ORDER — ARFORMOTEROL TARTRATE 15 MCG/2ML IN NEBU: INHALATION_SOLUTION | RESPIRATORY_TRACT | Status: DC

## 2011-11-04 MED ORDER — ALBUTEROL SULFATE HFA 108 (90 BASE) MCG/ACT IN AERS: INHALATION_SPRAY | RESPIRATORY_TRACT | Status: DC

## 2011-11-04 MED ORDER — BUDESONIDE 0.5 MG/2ML IN SUSP: RESPIRATORY_TRACT | Status: DC

## 2011-11-04 MED ORDER — PREDNISONE (PAK) 10 MG PO TABS: ORAL_TABLET | ORAL | Status: AC

## 2011-11-04 MED ORDER — TRAMADOL HCL 50 MG PO TABS: ORAL_TABLET | ORAL | Status: AC

## 2011-11-04 NOTE — Telephone Encounter (Signed)
Thanks. Pharmacy,pt, and patients daughter is aware.

## 2011-11-04 NOTE — Telephone Encounter (Signed)
This is not correct and I explained it to pt and daughter - the other rx has tylenol in it and is taken bid (not prn, goes in the pillbox) , the rx I have her has no tylenol and is taken prn and is not placed in the pillbox

## 2011-11-04 NOTE — Patient Instructions (Addendum)
Prednisone 10 mg take  4 each am x 2 days,   2 each am x 2 days,  1 each am x2days and stop  Only use your albuterol (proaire is Plan B) as a rescue medication to be used if you can't catch your breath by resting or doing a relaxed purse lip breathing pattern. The less you use it, the better it will work when you need it.   For cough plan B is to use Mucinex dm 1200 mg every 12 hours and flutter valve, plan C = Tramadol 50 up to 2 every 4 hours  See Tammy NP w/in 2 weeks with all your medications, even over the counter meds, separated in two separate bags, the ones you take no matter what vs the ones you stop once you feel better and take only as needed when you feel you need them.   Tammy  will generate for you a new user friendly medication calendar that will put [place] all on the same page re: your medication use.     Without this process, it simply isn't possible to assure that we are providing  your outpatient care  with  the attention to detail we feel you deserve.   If we cannot assure that you're getting that kind of care,  then we cannot manage your problem effectively from this clinic.  Once you have seen Tammy and we are sure that we're all on the same page with your medication use she will arrange follow up with me.

## 2011-11-04 NOTE — Telephone Encounter (Signed)
traMADol-acetaminophen (ULTRACET) 37.5-325 MG per tablet Sig : Take 1 tablet by mouth every 6 (six) hours as needed for pain is what patient is currently taking from another MD; pharmacy called to inform us of this and to let us know that he informed patient to stop taking the above med since MW gave her regular Tramadol. MW please advise if you are okay with this. Thanks.   Also, patient has never had Proair HFA filled at the pharmacy-pharmacist would like Rx for this. Thanks.

## 2011-11-04 NOTE — Progress Notes (Signed)
Subjective:     Patient ID: Carrie [name], female   DOB: 1941-05-25, 71 y.o.   MRN: [id number]  HPI  84 yowf never smoker c/o cough since 2000 day > night, dry > wet and exacerbated during times when she has URI. Former workup included allergy testing that showed only mild positivity and no response immunotherapy 1990s by Dr. Corinda Gubler, negative FOB in 2004, and only controlled as inpt with outpt nonadherence documented repeatedly.   08/02/07 c/o 3 months worsening dyspnea after exac of cough stopped her from exercising. when asked if anything it ever help the cough she said no. In fact the record indicates complete resolution during hospitalization 2004.   Seen 12/11/07 50% improvement, given prednisone taper and started on Symbicort 80/4.2mcg 2 pufs two times a day.   01/01/08 cough completely resolved but still doe x anything more than slow adl's. overall though admits these she is the best she's been in years . Increased symbicort to 160/4.5 2 two times a day   March 11, 2008 increased sob but only used neb 3 x in last month, no noct co's, no overt hb. Rec try off reglan to reduce risk of longterm side effects-  April 30, 2008 ov : worse x 2 weeks with increase dyspnea > cough, minimally productive. --started back on reglan, -referred to GI for follow up reflux   September 07, 2009-- med review. last visit w/ flare tx w/ steroids. IMproved back to baseline. rec d/c reglan due to shaking     January 08, 2010 ov 1 month followup. Pt c/o increased SOB since more humid weather. She states she is "in and out" all day running errands and doing yardwork and gets out of breath easily. She also c/o frequent need to clear throat and has some prod cough with white sputum. brought meds and med calendar with multiple inconsistencies. not doing as well on symbicort in terms of symptom control. no noct or early am flares --Changed  to budesonide/brovana neb.   January 21, 2010--Presents for follow up and med  review. She was changed back to Budesonide and Brovana last visit. She is feeling better -best she has been in a while. She has brought all her meds. We organized them and updated her med calendar. She got assistance with her neb meds and now can afford them. She is feeling some better, has been under alot of stress w/ recent death of son and daughter is sick alot w/ skin condition. on clindamycin and erthromycin too > Thrush on neb > no better with magic mouthwash > started dlflucan   11/23/10 ov/NP doing better, new med calendar, multiple changes in chronic meds rec follow calendar, no change resp rx  02/07/2011  f/u ov/Carrie [name] cc mostly dry cough worse x one month day > night, does not wake up her up. Feels needs to use the proaire needed w/in an hour of the neb. Not following prns at bottom of calendar despite mulitple documented past attempts to help her self manage the problem rec  Follow the med calendar. Remember to use the zegrid before breakfast and at bedtime Push mucinex dm to maxiumum with flutter valve with coughing  Prednisone 10 mg take  4 each am x 2 days,   2 each am x 2 days,  1 each am x2days and stop    03/08/2011 f/u ov/Carrie [name] cc  Cough > sob but neither better, not using calendar as instructed re use of prns listed as specific action plans  with max doses listed in a very user friendly format, esp meds intended to help with cough, no response to chortrimeton. >>decreased lorazepam and encouraged flutter valve   03/22/2011 Follow Up  Pt  Returns for follow up and med review. She feels that her breathing has improved slightly since last visit. Last ov recommended to maximize her mucinex dm and flutter valve. She says cough is much better. We reviewed her meds and updated her med calendar w/ pt education  No discolored mucus or fever.  rec No change, follow calendar  07/04/2011 f/u ov/Carrie [name] cc cough worse x 2 weeks esp at hs, white mucus not using nasal steroids as directed on action  plan part of calendar.  No change mild chronic doe or hfa use daytime. >>steroid taper   07/21/2011 Follow up and med review Pt returns for follow up and med review. We reviewed all her meds and organized them into a med calendar today with pt education . It appear she is taking her meds correctly.  Last ov she had a flare of her asthma , tx w/ steroid taper-now is improved.  No chest pain or edema.  She is feeling better and back to her baseline.  rec Follow med calendar closely and bring to each visit.  Continue on current regimen.   11/04/2011 f/u ov/Carrie [name] cc progressively worse breathing worse with dry cough ever since husband died 2 months prior to OV  , but sometimes sob even when not couging; no longer following med calendar, daughter managing meds now from pillbox and does not have any prn tramadol.  No purulent sputum, no choking of food or overt sinus or reflux symptoms  Sleeping ok without nocturnal  or early am exacerbation  of respiratory  c/o's or need for noct saba. Also denies any obvious fluctuation of symptoms with weather or environmental changes or other aggravating or alleviating factors except as outlined above   ROS  At present neg for  any significant sore throat, dysphagia, dental problems, itching, sneezing,  nasal congestion or excess/ purulent secretions, ear ache,   fever, chills, sweats, unintended wt loss, pleuritic or exertional cp, hemoptysis, palpitations, orthopnea pnd or leg swelling.  Also denies presyncope, palpitations, heartburn, abdominal pain, anorexia, nausea, vomiting, diarrhea  or change in bowel or urinary habits, change in stools or urine, dysuria,hematuria,  rash, arthralgias, visual complaints, headache, numbness weakness or ataxia or problems with walking or coordination. No noted change in mood/affect or memory.     Allergies    1) ! Asa  2) ! Codeine  3) ! Sulfa  4) ! Morphine  5) ! Pcn  6) ! Reglan (Metoclopramide Hcl)     Past Surgical  History:  Appendectomy  Tubal Ligation  Cystic surgery on breast-bilateral  Lt. Ankle Surgery    Family History:  mother died at 53 from heart attack  father died from heart attack  sister 1 died from cirrhosis of liver and subsequent staff infection  sister 2 is a diabetic  sister 3 has hx of heart attack and stents  brother has hx of heart disease and diabetes  No FH of Colon Cancer:  Family History of Breast Cancer:mother  Family History of Ovarian Cancer: Sister x 2  Family History of Colon Polyps: Mother    Social History:  she has never smoked was previously exposed to her husband's cigarettes  occasionally exercises  no caffeine  no etoh  married  2 children  Husband died 13-Apr-2013p  98 y marriage     Past Medical History:  COUGH (ICD-786.2) onset about 58  - Flared off Reglan April 30, 2008  - Changed Reglan > erythromcyin per Arlyce Dice 08/2008  - Add back reglan December 26, 2008 >>reglan stopped intolerant  - Sinus ct 03/17/09 nl  -CT chest 11/08 scattered pulm nodules, 5/10 stable pulm nodules (never smoker)  Hyperlipidemia  Anxiety  Chronic Asthma, severe..............................................................................Marland KitchenWert  - FEV1 29%, ratio 43% with 16% improvement after bronchodilators 11/02/2007  - FEV1 33%, ratio 47, with 14% improvment after B2 July 13, 2009 with DLC0 59> 125% corrected  - [facility] July 13, 2009 50 %> 100% August 13, 2009 > 75% January 08, 2010  Complex med regimen- med calendar updated May 22, 2008 , .January 12, 2009, January 21, 2010 , 05/24/10 , 03/22/2011 , 07/21/2011  - Poor correlation between calendar and bags of meds January 08, 2010               Objective:   Physical Exam   anxious > depressed amb wf nad   wt 193 September 22, 2008  > 175 June 24, 2010 >177 03/08/2011 >>176 03/22/2011 >  179 07/04/2011 >178 07/21/2011 > 11/04/2011  187  HEENT mild nonspecific turbinate edema. Oropharynx no thrush or excess pnd or  cobblestoning. No JVD or cervical adenopathy. Mild accessory muscle hypertrophy. Trachea midline, nl thryroid. Chest decreased BS in base, no wheezing, Regular rate and rhythm without murmur gallop or rub or increase P2 or edema. Abd: no hsm, nl excursion. Ext warm without cyanosis or clubbing.  Assessment:         Plan:

## 2011-11-05 NOTE — Assessment & Plan Note (Addendum)
Symptoms are markedly disproportionate to objective findings and not clear this is a lung problem but pt does appear to have difficult airway management issues. DDX of  difficult airways managment all start with A and  include Adherence, Ace Inhibitors, Acid Reflux, Active Sinus Disease, Alpha 1 Antitripsin deficiency, Anxiety masquerading as Airways dz,  ABPA,  allergy(esp in young), Aspiration (esp in elderly), Adverse effects of DPI,  Active smokers, plus two Bs  = Bronchiectasis and Beta blocker use..and one C= CHF  Adherence is always the initial "prime suspect" and is a multilayered concern that requires a "trust but verify" approach in every patient - starting with knowing how to use medications, especially inhalers, correctly, keeping up with refills and understanding the fundamental difference between maintenance and prns vs those medications only taken for a very short course and then stopped and not refilled.   ? Acid reflux > reviewed diet and med timing  ? Sinus dz > note MRI Brain neg for sinus dz 07/19/11  ? Anxiety > typically a dx of exclusion but note that all this started with death of husband from resp failure.

## 2011-11-05 NOTE — Assessment & Plan Note (Signed)
I had an extended discussion with the patient/daughter  today lasting 15 to 20 minutes of a 25 minute visit on the following issues:  She is no longer following the calendar as written and clearly does better when she follows it to the letter, including the prns    Each maintenance medication was reviewed in detail including most importantly the difference between maintenance and as needed and under what circumstances the prns are to be used. This was done in the context of a medication calendar review which provided the patient with a user-friendly unambiguous mechanism for medication administration and reconciliation and provides an action plan for all active problems. It is critical that this be shown to every doctor  for modification during the office visit if necessary so the patient can use it as a working document.

## 2011-11-17 ENCOUNTER — Encounter: Payer: Medicare Other | Admitting: Adult Health

## 2012-02-14 ENCOUNTER — Encounter: Payer: Medicare Other | Admitting: Cardiology

## 2012-03-20 ENCOUNTER — Encounter: Payer: Self-pay | Admitting: Cardiovascular Disease

## 2012-03-20 ENCOUNTER — Encounter: Payer: Self-pay | Admitting: *Deleted

## 2012-03-20 ENCOUNTER — Other Ambulatory Visit: Payer: Self-pay | Admitting: Cardiovascular Disease

## 2012-03-20 ENCOUNTER — Ambulatory Visit (INDEPENDENT_AMBULATORY_CARE_PROVIDER_SITE_OTHER): Payer: Medicare Other | Admitting: Cardiovascular Disease

## 2012-03-20 VITALS — BP 112/80 | HR 78 | Ht 66.5 in | Wt 180.4 lb

## 2012-03-20 DIAGNOSIS — R0789 Other chest pain: Secondary | ICD-10-CM

## 2012-03-20 DIAGNOSIS — R079 Chest pain, unspecified: Secondary | ICD-10-CM

## 2012-03-20 NOTE — Assessment & Plan Note (Signed)
Carrie [name] presents for further evaluation of episodes of chest pressure, severe diaphoresis, and syncope. These symptoms certainly could be due to  her severe asthmatic lung disease but also could be due to significant coronary artery disease. She has a very strong family history of coronary artery disease.  She's wheezing today and I do not think that it would be wise to schedule her for a stress Myoview study. I don't think that she would actually be able to walk on the treadmill. I do not think that dobutamine would give [place] a very good result and I don't think that she would tolerate it very well. I also do not think that she would tolerate Lexiscan.  Given these restrictions I think our best option is to proceed with cardiac catheterization. Several of her symptoms are very worrisome-chest pressure radiating to the shoulder and through to the back associated with profound diaphoresis and syncope/near-syncope.  We have discussed the risks, benefits, and options regarding heart catheterization. She understands and agrees to proceed.

## 2012-03-20 NOTE — Progress Notes (Signed)
Carrie [name] Date of Birth  08-14-1940       [facility]    [place] [address]  [address], [address] [place], [place]  [address]   [place], [place]  [address] [phone]     [phone]   Fax  [phone]    Fax [phone]  Problem List: 1. Syncope 2. Hyperlipidemia 3. severe asthma 4.  History of Present Illness:  Carrie [name] is a 71 yo who I have seen in the past.  She has been having problems with syncope.  She has a strong family  hx of CAD.  She has been having chest pressure - associated with severe dyspnea.  This occurs in the middle of the night.  It occasionally eases off with Tums or Pepsi.  The pain radiates to her left breast and to her left shoulder blade / left arm.  She has profuse diaphoresis.   These episodes of diaphoresis are followed by syncope.    These episodes of syncope have occurred as much as several times a week.   She has been taking all of her asthma meds.   She has been seeing a pulmonologist at [facility].  Current Outpatient Prescriptions on File Prior to Visit  Medication Sig Dispense Refill  . albuterol-ipratropium (COMBIVENT) 18-103 MCG/ACT inhaler Inhale 1 puff into the lungs every 4 (four) hours as needed.      [name] atorvastatin (LIPITOR) 20 MG tablet Take 1 tablet by mouth At bedtime.      . donepezil (ARICEPT) 5 MG tablet Take 5 mg by mouth at bedtime as needed.      . ezetimibe (ZETIA) 10 MG tablet Take 10 mg by mouth at bedtime.      [name] LORazepam (ATIVAN) 1 MG tablet Take 1 mg by mouth. 0.5 Tablets in AM and 1 tablet in PM PRN      . magnesium hydroxide (MILK OF MAGNESIA) 400 MG/5ML suspension Take by mouth daily as needed. Per bottle when needed for constipation      . montelukast (SINGULAIR) 10 MG tablet Take 10 mg by mouth at bedtime.      . Multiple Vitamin (MULITIVITAMIN WITH MINERALS) TABS Take 1 tablet by mouth daily.      Maxwell Caul Bicarbonate (ZEGERID OTC) 20-1100 MG CAPS Take 1  capsule by mouth 30 minutes before 1st meal of the day and at bedtime      . raloxifene (EVISTA) 60 MG tablet Take 60 mg by mouth daily.        [name] RANITIDINE HCL PO Take by mouth 2 (two) times daily.      [name] torsemide (DEMADEX) 20 MG tablet Take 20 mg by mouth daily.        [name] venlafaxine (EFFEXOR-XR) 75 MG 24 hr capsule 1 tablets at bedtime        Allergies  Allergen Reactions  . Aspirin     REACTION: hives  . Codeine     REACTION: rash/itch  . Metoclopramide Hcl     REACTION: intolerant, restless hands  . Morphine     REACTION: hives  . Penicillins     REACTION: rash/itch  . Sulfonamide Derivatives     REACTION: rash/itch    Past Medical History  Diagnosis Date  . Cough   . Hyperlipidemia   . Anxiety   . Chronic asthma   . Atherosclerosis of artery   . Persistent headaches     Past Surgical History  Procedure Date  . Appendectomy   . Tubal ligation   . Cystic surgery on breast     bilateral  . Left ankle surgery   . Shoulder surgery 06/2010    bone spurs    History  Smoking status  . Never Smoker   Smokeless tobacco  . Not on file  Comment: exposed to 2nd hand smoke    History  Alcohol Use No    Family History  Problem Relation Age of Onset  . Heart attack Mother   . Heart attack Father   . Cirrhosis Sister   . Diabetes Sister   . Heart attack Sister   . Heart disease Brother   . Diabetes Brother   . Breast cancer Mother   . Ovarian cancer Sister   . Ovarian cancer Sister   . Colon polyps Mother     Reviw of Systems:  Reviewed in the HPI.  All other systems are negative.  Physical Exam: Blood pressure 112/80, pulse 78, height 5' 6.5" (1.689 m), weight 180 lb 6.4 oz (81.829 kg), SpO2 96.00%. General: Well developed, well nourished, in no acute distress.  Head: Normocephalic, atraumatic, sclera non-icteric, mucus membranes are moist,   Neck: Supple. Carotids are 2 + without bruits. No JVD  Lungs: She has diffuse wheezes with expiration.  She states that these wheezes are fairly common for her.  Heart: regular rate.  normal  S1 S2. No murmurs, gallops or rubs.  Abdomen: Soft, non-tender, non-distended with normal bowel sounds. No hepatomegaly. No rebound/guarding. No masses.  Msk:  Strength and tone are normal  Extremities: No clubbing or cyanosis. No edema.  Distal pedal pulses are 2+ and equal bilaterally.  Neuro: Alert and oriented X 3. Moves all extremities spontaneously.  Psych:  Responds to questions appropriately with a normal affect.  ECG: Oct. 1, 2013 - NSR at 73,  Low voltage QRS.  Assessment / Plan:

## 2012-03-20 NOTE — Patient Instructions (Addendum)
Your physician has requested that you have a cardiac catheterization. Cardiac catheterization is used to diagnose and/or treat various heart conditions. Doctors may recommend this procedure for a number of different reasons. The most common reason is to evaluate chest pain. Chest pain can be a symptom of coronary artery disease (CAD), and cardiac catheterization can show whether plaque is narrowing or blocking your heart's arteries. This procedure is also used to evaluate the valves, as well as measure the blood flow and oxygen levels in different parts of your heart. Please follow instruction sheet, as given.  Your physician has recommended that you wear an event monitor. Event monitors are medical devices that record the heart's electrical activity. Doctors most often [place] these monitors to diagnose arrhythmias. Arrhythmias are problems with the speed or rhythm of the heartbeat. The monitor is a small, portable device. You can wear one while you do your normal daily activities. This is usually used to diagnose what is causing palpitations/syncope (passing out).  Your physician recommends that you return for lab work in: today bmet pt/inr cbc

## 2012-03-21 ENCOUNTER — Encounter (INDEPENDENT_AMBULATORY_CARE_PROVIDER_SITE_OTHER): Payer: Medicare Other

## 2012-03-21 DIAGNOSIS — R55 Syncope and collapse: Secondary | ICD-10-CM

## 2012-03-21 DIAGNOSIS — R079 Chest pain, unspecified: Secondary | ICD-10-CM

## 2012-03-23 ENCOUNTER — Encounter (HOSPITAL_COMMUNITY): Payer: Self-pay

## 2012-03-23 ENCOUNTER — Inpatient Hospital Stay (HOSPITAL_BASED_OUTPATIENT_CLINIC_OR_DEPARTMENT_OTHER)
Admission: RE | Admit: 2012-03-23 | Discharge: 2012-03-23 | Disposition: A | Discharge status: Hospice | Payer: Medicare Other | Source: Ambulatory Visit | Attending: Cardiovascular Disease | Admitting: Cardiovascular Disease

## 2012-03-23 ENCOUNTER — Encounter (HOSPITAL_BASED_OUTPATIENT_CLINIC_OR_DEPARTMENT_OTHER)
Admission: RE | Disposition: A | Discharge status: Hospice | Payer: Self-pay | Source: Ambulatory Visit | Attending: Cardiovascular Disease

## 2012-03-23 ENCOUNTER — Inpatient Hospital Stay (HOSPITAL_COMMUNITY)
Admission: AD | Admit: 2012-03-23 | Discharge: 2012-03-28 | DRG: 248 | Disposition: A | Discharge status: Home / Self Care | Payer: Medicare Other | Source: Ambulatory Visit | Attending: Cardiology | Admitting: Cardiology

## 2012-03-23 ENCOUNTER — Encounter (HOSPITAL_COMMUNITY)
Admission: AD | Disposition: A | Discharge status: Home / Self Care | Payer: Self-pay | Source: Ambulatory Visit | Attending: Cardiology

## 2012-03-23 ENCOUNTER — Ambulatory Visit (HOSPITAL_COMMUNITY): Admit: 2012-03-23 | Payer: Self-pay | Admitting: Cardiology

## 2012-03-23 DIAGNOSIS — I2510 Atherosclerotic heart disease of native coronary artery without angina pectoris: Secondary | ICD-10-CM | POA: Insufficient documentation

## 2012-03-23 DIAGNOSIS — K089 Disorder of teeth and supporting structures, unspecified: Secondary | ICD-10-CM | POA: Diagnosis present

## 2012-03-23 DIAGNOSIS — I2699 Other pulmonary embolism without acute cor pulmonale: Secondary | ICD-10-CM

## 2012-03-23 DIAGNOSIS — R0789 Other chest pain: Secondary | ICD-10-CM

## 2012-03-23 DIAGNOSIS — I200 Unstable angina: Secondary | ICD-10-CM | POA: Diagnosis present

## 2012-03-23 DIAGNOSIS — J45909 Unspecified asthma, uncomplicated: Secondary | ICD-10-CM | POA: Diagnosis present

## 2012-03-23 DIAGNOSIS — F3289 Other specified depressive episodes: Secondary | ICD-10-CM | POA: Diagnosis present

## 2012-03-23 DIAGNOSIS — E785 Hyperlipidemia, unspecified: Secondary | ICD-10-CM | POA: Insufficient documentation

## 2012-03-23 DIAGNOSIS — R55 Syncope and collapse: Secondary | ICD-10-CM | POA: Insufficient documentation

## 2012-03-23 DIAGNOSIS — Z79899 Other long term (current) drug therapy: Secondary | ICD-10-CM | POA: Insufficient documentation

## 2012-03-23 DIAGNOSIS — I82409 Acute embolism and thrombosis of unspecified deep veins of unspecified lower extremity: Secondary | ICD-10-CM

## 2012-03-23 DIAGNOSIS — F329 Major depressive disorder, single episode, unspecified: Secondary | ICD-10-CM | POA: Diagnosis present

## 2012-03-23 DIAGNOSIS — Z7902 Long term (current) use of antithrombotics/antiplatelets: Secondary | ICD-10-CM | POA: Insufficient documentation

## 2012-03-23 DIAGNOSIS — Z955 Presence of coronary angioplasty implant and graft: Secondary | ICD-10-CM

## 2012-03-23 HISTORY — DX: Nausea with vomiting, unspecified: R11.2

## 2012-03-23 HISTORY — PX: PERCUTANEOUS CORONARY STENT INTERVENTION (PCI-S): SHX5485

## 2012-03-23 HISTORY — DX: Other specified postprocedural states: Z98.890

## 2012-03-23 HISTORY — DX: Other pulmonary embolism without acute cor pulmonale: I26.99

## 2012-03-23 SURGERY — JV LEFT HEART CATHETERIZATION WITH CORONARY ANGIOGRAM
Anesthesia: Moderate Sedation

## 2012-03-23 SURGERY — PERCUTANEOUS CORONARY STENT INTERVENTION (PCI-S)
Anesthesia: LOCAL

## 2012-03-23 MED ORDER — FAMOTIDINE 10 MG PO TABS: 10.0000 mg | ORAL_TABLET | Freq: Every day | ORAL | Status: DC

## 2012-03-23 MED ORDER — ALBUTEROL SULFATE (2.5 MG/3ML) 0.083% IN NEBU: 2.5000 mg | INHALATION_SOLUTION | Freq: Four times a day (QID) | RESPIRATORY_TRACT | Status: DC | PRN

## 2012-03-23 MED ORDER — IPRATROPIUM-ALBUTEROL 18-103 MCG/ACT IN AERO
1.0000 | INHALATION_SPRAY | RESPIRATORY_TRACT | Status: DC | PRN
  Filled 2012-03-23: qty 14.7

## 2012-03-23 MED ORDER — CLOPIDOGREL BISULFATE 300 MG PO TABS
600.0000 mg | ORAL_TABLET | Freq: Every day | ORAL | Status: DC
  Administered 2012-03-23: 600 mg via ORAL

## 2012-03-23 MED ORDER — PANTOPRAZOLE SODIUM 40 MG PO TBEC: 40.0000 mg | DELAYED_RELEASE_TABLET | Freq: Every day | ORAL | Status: DC

## 2012-03-23 MED ORDER — ACETAMINOPHEN 325 MG PO TABS
ORAL_TABLET | ORAL | Status: AC
  Filled 2012-03-23: qty 2

## 2012-03-23 MED ORDER — MONTELUKAST SODIUM 10 MG PO TABS
10.0000 mg | ORAL_TABLET | Freq: Every day | ORAL | Status: DC
  Administered 2012-03-23 – 2012-03-27 (×5): 10 mg via ORAL
  Filled 2012-03-23 (×6): qty 1

## 2012-03-23 MED ORDER — ACETAMINOPHEN 325 MG PO TABS: 650.0000 mg | ORAL_TABLET | ORAL | Status: DC | PRN

## 2012-03-23 MED ORDER — RANITIDINE HCL 150 MG PO TABS
300.0000 mg | ORAL_TABLET | Freq: Two times a day (BID) | ORAL | Status: DC
  Administered 2012-03-23 – 2012-03-25 (×5): 300 mg via ORAL
  Filled 2012-03-23 (×7): qty 2

## 2012-03-23 MED ORDER — MOMETASONE FURO-FORMOTEROL FUM 200-5 MCG/ACT IN AERO
2.0000 | INHALATION_SPRAY | Freq: Three times a day (TID) | RESPIRATORY_TRACT | Status: DC
  Administered 2012-03-24 – 2012-03-27 (×10): 2 via RESPIRATORY_TRACT

## 2012-03-23 MED ORDER — SODIUM CHLORIDE 0.9 % IV SOLN: INTRAVENOUS | Status: DC

## 2012-03-23 MED ORDER — RALOXIFENE HCL 60 MG PO TABS: 60.0000 mg | ORAL_TABLET | Freq: Every day | ORAL | Status: DC

## 2012-03-23 MED ORDER — DONEPEZIL HCL 5 MG PO TABS: 5.0000 mg | ORAL_TABLET | Freq: Every evening | ORAL | Status: DC | PRN

## 2012-03-23 MED ORDER — ADULT MULTIVITAMIN W/MINERALS CH: 1.0000 | ORAL_TABLET | Freq: Every day | ORAL | Status: DC

## 2012-03-23 MED ORDER — LIDOCAINE HCL (PF) 1 % IJ SOLN
INTRAMUSCULAR | Status: AC
  Filled 2012-03-23: qty 30

## 2012-03-23 MED ORDER — MAGNESIUM 500 MG PO CAPS: 500.0000 mg | ORAL_CAPSULE | Freq: Every day | ORAL | Status: DC

## 2012-03-23 MED ORDER — SODIUM CHLORIDE 0.9 % IV SOLN
INTRAVENOUS | Status: AC
  Administered 2012-03-23: 15:00:00 via INTRAVENOUS

## 2012-03-23 MED ORDER — ONDANSETRON HCL 4 MG/2ML IJ SOLN: 4.0000 mg | Freq: Four times a day (QID) | INTRAMUSCULAR | Status: DC | PRN

## 2012-03-23 MED ORDER — EZETIMIBE 10 MG PO TABS
10.0000 mg | ORAL_TABLET | Freq: Every day | ORAL | Status: DC
  Administered 2012-03-23 – 2012-03-27 (×5): 10 mg via ORAL
  Filled 2012-03-23 (×6): qty 1

## 2012-03-23 MED ORDER — IPRATROPIUM-ALBUTEROL 18-103 MCG/ACT IN AERO
1.0000 | INHALATION_SPRAY | Freq: Three times a day (TID) | RESPIRATORY_TRACT | Status: DC
  Administered 2012-03-23 – 2012-03-28 (×15): 1 via RESPIRATORY_TRACT

## 2012-03-23 MED ORDER — HEPARIN (PORCINE) IN NACL 2-0.9 UNIT/ML-% IJ SOLN
INTRAMUSCULAR | Status: AC
  Filled 2012-03-23: qty 1500

## 2012-03-23 MED ORDER — MAGNESIUM HYDROXIDE 400 MG/5ML PO SUSP: 30.0000 mL | Freq: Every day | ORAL | Status: DC | PRN

## 2012-03-23 MED ORDER — ADULT MULTIVITAMIN W/MINERALS CH
1.0000 | ORAL_TABLET | Freq: Every day | ORAL | Status: DC
  Administered 2012-03-24 – 2012-03-28 (×5): 1 via ORAL
  Filled 2012-03-23 (×5): qty 1

## 2012-03-23 MED ORDER — LORAZEPAM 1 MG PO TABS: 1.0000 mg | ORAL_TABLET | Freq: Four times a day (QID) | ORAL | Status: DC | PRN

## 2012-03-23 MED ORDER — NITROGLYCERIN 0.2 MG/ML ON CALL CATH LAB
INTRAVENOUS | Status: AC
  Filled 2012-03-23: qty 1

## 2012-03-23 MED ORDER — TORSEMIDE 20 MG PO TABS: 20.0000 mg | ORAL_TABLET | Freq: Every day | ORAL | Status: DC

## 2012-03-23 MED ORDER — ALBUTEROL SULFATE (5 MG/ML) 0.5% IN NEBU
2.5000 mg | INHALATION_SOLUTION | Freq: Four times a day (QID) | RESPIRATORY_TRACT | Status: DC | PRN
  Administered 2012-03-23: 17:00:00 2.5 mg via RESPIRATORY_TRACT
  Filled 2012-03-23: qty 0.5

## 2012-03-23 MED ORDER — NITROGLYCERIN 0.4 MG SL SUBL
0.4000 mg | SUBLINGUAL_TABLET | SUBLINGUAL | Status: DC | PRN
  Administered 2012-03-23: 0.4 mg via SUBLINGUAL

## 2012-03-23 MED ORDER — HEPARIN (PORCINE) IN NACL 100-0.45 UNIT/ML-% IJ SOLN
950.0000 [IU]/h | INTRAMUSCULAR | Status: DC
  Administered 2012-03-23: 950 [IU]/h via INTRAVENOUS
  Filled 2012-03-23: qty 250

## 2012-03-23 MED ORDER — ACETAMINOPHEN 650 MG RE SUPP: 650.0000 mg | Freq: Four times a day (QID) | RECTAL | Status: DC | PRN

## 2012-03-23 MED ORDER — EZETIMIBE 10 MG PO TABS: 10.0000 mg | ORAL_TABLET | Freq: Every day | ORAL | Status: DC

## 2012-03-23 MED ORDER — MONTELUKAST SODIUM 10 MG PO TABS: 10.0000 mg | ORAL_TABLET | Freq: Every day | ORAL | Status: DC

## 2012-03-23 MED ORDER — ATORVASTATIN CALCIUM 20 MG PO TABS
20.0000 mg | ORAL_TABLET | Freq: Every day | ORAL | Status: DC
  Administered 2012-03-23 – 2012-03-24 (×2): 20 mg via ORAL
  Filled 2012-03-23 (×3): qty 1

## 2012-03-23 MED ORDER — ALBUTEROL SULFATE (5 MG/ML) 0.5% IN NEBU: 2.5000 mg | INHALATION_SOLUTION | Freq: Four times a day (QID) | RESPIRATORY_TRACT | Status: DC | PRN

## 2012-03-23 MED ORDER — LORAZEPAM 0.5 MG PO TABS
0.5000 mg | ORAL_TABLET | Freq: Four times a day (QID) | ORAL | Status: DC | PRN
  Administered 2012-03-24 – 2012-03-28 (×7): 0.5 mg via ORAL
  Filled 2012-03-23 (×7): qty 1

## 2012-03-23 MED ORDER — ACETAMINOPHEN 325 MG PO TABS
650.0000 mg | ORAL_TABLET | Freq: Four times a day (QID) | ORAL | Status: DC | PRN
  Administered 2012-03-23 – 2012-03-28 (×8): 650 mg via ORAL
  Filled 2012-03-23 (×7): qty 2

## 2012-03-23 MED ORDER — PANTOPRAZOLE SODIUM 40 MG PO TBEC
40.0000 mg | DELAYED_RELEASE_TABLET | Freq: Every day | ORAL | Status: DC
  Administered 2012-03-24 – 2012-03-28 (×4): 40 mg via ORAL
  Filled 2012-03-23 (×4): qty 1

## 2012-03-23 MED ORDER — IPRATROPIUM-ALBUTEROL 18-103 MCG/ACT IN AERO: 1.0000 | INHALATION_SPRAY | RESPIRATORY_TRACT | Status: DC | PRN

## 2012-03-23 MED ORDER — DONEPEZIL HCL 5 MG PO TABS
5.0000 mg | ORAL_TABLET | Freq: Every day | ORAL | Status: DC
  Administered 2012-03-23 – 2012-03-27 (×5): 5 mg via ORAL
  Filled 2012-03-23 (×6): qty 1

## 2012-03-23 MED ORDER — RALOXIFENE HCL 60 MG PO TABS
60.0000 mg | ORAL_TABLET | Freq: Every day | ORAL | Status: DC
  Administered 2012-03-24 – 2012-03-28 (×5): 60 mg via ORAL
  Filled 2012-03-23 (×5): qty 1

## 2012-03-23 MED ORDER — ATORVASTATIN CALCIUM 20 MG PO TABS: 20.0000 mg | ORAL_TABLET | Freq: Every day | ORAL | Status: DC

## 2012-03-23 MED ORDER — VENLAFAXINE HCL ER 75 MG PO CP24: 75.0000 mg | ORAL_CAPSULE | Freq: Every day | ORAL | Status: DC

## 2012-03-23 MED ORDER — VENLAFAXINE HCL ER 75 MG PO CP24
75.0000 mg | ORAL_CAPSULE | Freq: Every day | ORAL | Status: DC
  Administered 2012-03-24 – 2012-03-28 (×5): 75 mg via ORAL
  Filled 2012-03-23 (×7): qty 1

## 2012-03-23 NOTE — CV Procedure (Signed)
See H and P from Dr. Elease Hashimoto.  Patient has an ASA allergy and received plavix downstairs.  She also is getting omeprazole daily.  As such, she has at least a 40% chance of being a hypo-responder to clopidogrel, and perhaps more with omeprazole.  As such, her procedure is appropriately delayed.  She did have some chest pain at rest, so we will test her clopidogrel responsiveness in the am.  If she is a hyporesponder, which seems likely, then an alternative agent will be appropriate.  Reviewed in detail with patient and family.

## 2012-03-23 NOTE — H&P (Signed)
Carrie [name]  Date of Birth Apr 07, 1941  [facility] [place]  [address], [address]  [place], [place] [address] [place], [place] [address]  [phone]  Fax [phone] Fax [phone]  Problem List:  1. Syncope  2. Hyperlipidemia  3. severe asthma  4.  History of Present Illness:  Carrie [name] is a 71 yo who I have seen in the past. She has been having problems with syncope. She has a strong family hx of CAD. She has been having chest pressure - associated with severe dyspnea. This occurs in the middle of the night. It occasionally eases off with Tums or Pepsi. The pain radiates to her left breast and to her left shoulder blade / left arm. She has profuse diaphoresis. These episodes of diaphoresis are followed by syncope. These episodes of syncope have occurred as much as several times a week.  She has been taking all of her asthma meds. She has been seeing a pulmonologist at [facility].  Current Outpatient Prescriptions on File Prior to Visit   Medication  Sig  Dispense  Refill   .  albuterol-ipratropium (COMBIVENT) 18-103 MCG/ACT inhaler  Inhale 1 puff into the lungs every 4 (four) hours as needed.     [name]  atorvastatin (LIPITOR) 20 MG tablet  Take 1 tablet by mouth At bedtime.     .  donepezil (ARICEPT) 5 MG tablet  Take 5 mg by mouth at bedtime as needed.     .  ezetimibe (ZETIA) 10 MG tablet  Take 10 mg by mouth at bedtime.     [name]  LORazepam (ATIVAN) 1 MG tablet  Take 1 mg by mouth. 0.5 Tablets in AM and 1 tablet in PM PRN     .  magnesium hydroxide (MILK OF MAGNESIA) 400 MG/5ML suspension  Take by mouth daily as needed. Per bottle when needed for constipation     .  montelukast (SINGULAIR) 10 MG tablet  Take 10 mg by mouth at bedtime.     .  Multiple Vitamin (MULITIVITAMIN WITH MINERALS) TABS  Take 1 tablet by mouth daily.     Maxwell Caul Bicarbonate (ZEGERID OTC) 20-1100 MG CAPS  Take 1 capsule by mouth 30 minutes  before 1st meal of the day and at bedtime     .  raloxifene (EVISTA) 60 MG tablet  Take 60 mg by mouth daily.     [name]  RANITIDINE HCL PO  Take by mouth 2 (two) times daily.     [name]  torsemide (DEMADEX) 20 MG tablet  Take 20 mg by mouth daily.     [name]  venlafaxine (EFFEXOR-XR) 75 MG 24 hr capsule  1 tablets at bedtime      Allergies   Allergen  Reactions   .  Aspirin      REACTION: hives   .  Codeine      REACTION: rash/itch   .  Metoclopramide Hcl      REACTION: intolerant, restless hands   .  Morphine      REACTION: hives   .  Penicillins      REACTION: rash/itch   .  Sulfonamide Derivatives      REACTION: rash/itch    Past Medical History   Diagnosis  Date   .  Cough    .  Hyperlipidemia    .  Anxiety    .  Chronic asthma    .  Atherosclerosis of artery    .  Persistent headaches     Past Surgical History   Procedure  Date   .  Appendectomy    .  Tubal ligation    .  Cystic surgery on breast      bilateral   .  Left ankle surgery    .  Shoulder surgery  06/2010     bone spurs    History   Smoking status   .  Never Smoker   Smokeless tobacco   .  Not on file    Comment: exposed to 2nd hand smoke    History   Alcohol Use  No    Family History   Problem  Relation  Age of Onset   .  Heart attack  Mother    .  Heart attack  Father    .  Cirrhosis  Sister    .  Diabetes  Sister    .  Heart attack  Sister    .  Heart disease  Brother    .  Diabetes  Brother    .  Breast cancer  Mother    .  Ovarian cancer  Sister    .  Ovarian cancer  Sister    .  Colon polyps  Mother     Reviw of Systems:  Reviewed in the HPI. All other systems are negative.  Physical Exam:  Blood pressure 112/80, pulse 78, height 5' 6.5" (1.689 m), weight 180 lb 6.4 oz (81.829 kg), SpO2 96.00%.  General: Well developed, well nourished, in no acute distress.  Head: Normocephalic, atraumatic, sclera non-icteric, mucus membranes are moist,  Neck: Supple. Carotids are 2 + without bruits. No  JVD  Lungs: She has diffuse wheezes with expiration. She states that these wheezes are fairly common for her.  Heart: regular rate. normal S1 S2. No murmurs, gallops or rubs.  Abdomen: Soft, non-tender, non-distended with normal bowel sounds. No hepatomegaly. No rebound/guarding. No masses.  Msk: Strength and tone are normal  Extremities: No clubbing or cyanosis. No edema. Distal pedal pulses are 2+ and equal bilaterally.  Neuro: Alert and oriented X 3. Moves all extremities spontaneously.  Psych: Responds to questions appropriately with a normal affect.  ECG:  Oct. 1, 2013 - NSR at 73, Low voltage QRS.  Assessment / Plan:   Chest pressure - Elyn Aquas., MD 03/20/2012 6:49 PM Signed  Carrie [name] presents for further evaluation of episodes of chest pressure, severe diaphoresis, and syncope. These symptoms certainly could be due to her severe asthmatic lung disease but also could be due to significant coronary artery disease. She has a very strong family history of coronary artery disease.  She's wheezing today and I do not think that it would be wise to schedule her for a stress Myoview study. I don't think that she would actually be able to walk on the treadmill. I do not think that dobutamine would give [place] a very good result and I don't think that she would tolerate it very well. I also do not think that she would tolerate Lexiscan.  Given these restrictions I think our best option is to proceed with cardiac catheterization. Several of her symptoms are very worrisome-chest pressure radiating to the shoulder and through to the back associated with profound diaphoresis and syncope/near-syncope.  We have discussed the risks, benefits, and options regarding heart catheterization. She understands and agrees to proceed.  Vesta Mixer, Montez Hageman., MD, [facility] 03/23/2012, 9:23 AM Office - [phone] Pager [phone]

## 2012-03-23 NOTE — OR Nursing (Signed)
Dr Nahser at bedside to discuss results and treatment plan with pt and family 

## 2012-03-23 NOTE — CV Procedure (Signed)
    Cardiac Cath Note  NALINI ALCARAZ [id number] 01/12/1941  Procedure: left Heart Cardiac Catheterization Note Indications: Unstable angina  Procedure Details Consent: Obtained Time Out: Verified patient identification, verified procedure, site/side was marked, verified correct patient position, special equipment/implants available, Radiology Safety Procedures followed,  medications/allergies/relevent history reviewed, required imaging and test results available.  Performed   Medications: Fentanyl: 50 mcg IV Versed: 2 mg IV  The right femoral artery was easily canulated using a modified Seldinger technique.  Hemodynamics:   LV pressure: 172/28 Aortic pressure: 175/103  Angiography   Left Main: smooth and normal  Left anterior Descending: proximal vessel is smooth and normal.  There is a 90% stenosis in the mid LAD.  The remaining LAD is fairly smooth and normal.. The 1st diagonal is a moderate sized vessel with a 30% stenosis in the proximal segment.  Left Circumflex: large . The 1st OM is a large branch and is normal.  The 2nd OM is moderate-large in size and is normal  Right Coronary Artery: large and dominant. There are minor luminal irregularities between 20-30% in the proximal vessel  LV Gram: normal LV function. EF ~ 70%  Complications: No apparent complications Patient did tolerate procedure well.  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  Will set her up for PCI later today  Alvia Grove., MD, [facility] 03/23/2012, 10:13 AM Office - [phone] Pager [phone]

## 2012-03-23 NOTE — OR Nursing (Signed)
Transported to main cath lab via stretcher on monitor 

## 2012-03-23 NOTE — H&P (Signed)
Note of Dr. Elease Hashimoto prior to JV cath:  Carrie Aquas., MD  03/20/2012  6:51 PM  Signed      [name] Date of Birth              1940-08-18        [facility]                                       [place] [address]                   [address] [place], [place]  [address]                                  [place], [place]  [address] [phone]                                                  [phone]   Fax  [phone]                                          Fax [phone]   Problem List: 1. Syncope 2. Hyperlipidemia 3. severe asthma 4.   History of Present Illness:   Carrie [name] is a 71 yo who I have seen in the past.  Carrie [name] has been having problems with syncope.  Carrie [name] has a strong family  hx of CAD.  Carrie [name] has been having chest pressure - associated with severe dyspnea.  This occurs in the middle of the night.  It occasionally eases off with Tums or Pepsi.  The pain radiates to her left breast and to her left shoulder blade / left arm.  Carrie [name] has profuse diaphoresis.   These episodes of diaphoresis are followed by syncope.    These episodes of syncope have occurred as much as several times a week.    Carrie [name] has been taking all of her asthma meds.   Carrie [name] has been seeing a pulmonologist at [facility].    Current Outpatient Prescriptions on File Prior to Visit   Medication  Sig  Dispense  Refill   .  albuterol-ipratropium (COMBIVENT) 18-103 MCG/ACT inhaler  Inhale 1 puff into the lungs every 4 (four) hours as needed.         [name]  atorvastatin (LIPITOR) 20 MG tablet  Take 1 tablet by mouth At bedtime.         .  donepezil (ARICEPT) 5 MG tablet  Take 5 mg by mouth at bedtime as needed.         .  ezetimibe (ZETIA) 10 MG tablet  Take 10 mg by mouth at bedtime.         [name]  LORazepam (ATIVAN) 1 MG tablet  Take 1 mg by mouth. 0.5 Tablets in AM and 1 tablet in PM PRN         .  magnesium hydroxide (MILK OF MAGNESIA) 400 MG/5ML suspension   Take by mouth daily as needed. Per bottle when needed for constipation         .  montelukast (SINGULAIR) 10 MG tablet  Take 10 mg by mouth at bedtime.         .  Multiple Vitamin (MULITIVITAMIN WITH MINERALS) TABS  Take 1 tablet by mouth daily.         Maxwell Caul Bicarbonate (ZEGERID OTC) 20-1100 MG CAPS  Take 1 capsule by mouth 30 minutes before 1st meal of the day and at bedtime         .  raloxifene (EVISTA) 60 MG tablet  Take 60 mg by mouth daily.           [name]  RANITIDINE HCL PO  Take by mouth 2 (two) times daily.         [name]  torsemide (DEMADEX) 20 MG tablet  Take 20 mg by mouth daily.           [name]  venlafaxine (EFFEXOR-XR) 75 MG 24 hr capsule  1 tablets at bedtime             Allergies   Allergen  Reactions   .  Aspirin         REACTION: hives   .  Codeine         REACTION: rash/itch   .  Metoclopramide Hcl         REACTION: intolerant, restless hands   .  Morphine         REACTION: hives   .  Penicillins         REACTION: rash/itch   .  Sulfonamide Derivatives         REACTION: rash/itch       Past Medical History   Diagnosis  Date   .  Cough     .  Hyperlipidemia     .  Anxiety     .  Chronic asthma     .  Atherosclerosis of artery     .  Persistent headaches         Past Surgical History   Procedure  Date   .  Appendectomy     .  Tubal ligation     .  Cystic surgery on breast         bilateral   .  Left ankle surgery     .  Shoulder surgery  06/2010       bone spurs       History   Smoking status   .  Never Smoker    Smokeless tobacco   .  Not on file   Comment: exposed to 2nd hand smoke       History   Alcohol Use  No       Family History   Problem  Relation  Age of Onset   .  Heart attack  Mother     .  Heart attack  Father     .  Cirrhosis  Sister     .  Diabetes  Sister     .  Heart attack  Sister     .  Heart disease  Brother     .  Diabetes  Brother     .  Breast cancer  Mother     .  Ovarian cancer  Sister     .  Ovarian  cancer  Sister     .  Colon polyps  Mother        Reviw of Systems:   Reviewed in the HPI.  All other systems are negative.  Physical Exam: Blood pressure 112/80, pulse 78, height 5' 6.5" (1.689 m), weight 180 lb 6.4 oz (81.829 kg), SpO2 96.00%. General: Well developed, well nourished, in no acute distress.   Head: Normocephalic, atraumatic, sclera non-icteric, mucus membranes are moist,    Neck: Supple. Carotids are 2 + without bruits. No JVD   Lungs: Carrie [name] has diffuse wheezes with expiration. Carrie [name] states that these wheezes are fairly common for her.   Heart: regular rate.  normal  S1 S2. No murmurs, gallops or rubs.   Abdomen: Soft, non-tender, non-distended with normal bowel sounds. No hepatomegaly. No rebound/guarding. No masses.   Msk:  Strength and tone are normal   Extremities: No clubbing or cyanosis. No edema.  Distal pedal pulses are 2+ and equal bilaterally.   Neuro: Alert and oriented X 3. Moves all extremities spontaneously.   Psych:  Responds to questions appropriately with a normal affect.   ECG: Oct. 1, 2013 - NSR at 73,  Low voltage QRS.   Assessment / Plan:         Chest pressure - Carrie Aquas., MD  03/20/2012  6:49 PM  Signed Carrie [name] presents for further evaluation of episodes of chest pressure, severe diaphoresis, and syncope. These symptoms certainly could be due to  her severe asthmatic lung disease but also could be due to significant coronary artery disease. Carrie [name] has a very strong family history of coronary artery disease.   Carrie [name]'s wheezing today and I do not think that it would be wise to schedule her for a stress Myoview study. I don't think that Carrie [name] would actually be able to walk on the treadmill. I do not think that dobutamine would give [place] a very good result and I don't think that Carrie [name] would tolerate it very well. I also do not think that Carrie [name] would tolerate Lexiscan.   Given these restrictions I think our best option is to proceed  with cardiac catheterization. Several of her symptoms are very worrisome-chest pressure radiating to the shoulder and through to the back associated with profound diaphoresis and syncope/near-syncope.   We have discussed the risks, benefits, and options regarding heart catheterization. Carrie [name] understands and agrees to proceed.     Electronic signature on 03/20/2012      The patient underwent diagnostic cath by Dr. Elease Hashimoto with the following findings.  Cardiac Cath Note  Carrie [name]  [id number]  [birth year]  Procedure: left Heart Cardiac Catheterization Note  Indications: Unstable angina  Procedure Details  Consent: Obtained  Time Out: Verified patient identification, verified procedure, site/side was marked, verified correct patient position, special equipment/implants available, Radiology Safety Procedures followed, medications/allergies/relevent history reviewed, required imaging and test results available. Performed  Medications:  Fentanyl: 50 mcg IV  Versed: 2 mg IV  The right femoral artery was easily canulated using a modified Seldinger technique.  Hemodynamics:  LV pressure: 172/28  Aortic pressure: 175/103  Angiography  Left Main: smooth and normal  Left anterior Descending: proximal vessel is smooth and normal. There is a 90% stenosis in the mid LAD. The remaining LAD is fairly smooth and normal.. The 1st diagonal is a moderate sized vessel with a 30% stenosis in the proximal segment.  Left Circumflex: large . The 1st OM is a large branch and is normal. The 2nd OM is moderate-large in size and is normal  Right Coronary Artery: large and dominant. There are minor luminal irregularities between 20-30% in the proximal vessel  LV Gram: normal LV function. EF ~ 70%  Complications:  No apparent complications  Patient did tolerate procedure well.  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 Will set her up for PCI later today  Vesta Mixer, Montez Hageman., MD,  [facility]  03/23/2012, 10:13 AM  Office - [phone]  Pager [phone]    Carrie [name] was noted in the IC lab to be allergic to ASA, and status of responsiveness to clopidogrel is unknown, so the procedure is appropriately delayed until that status is identified.  Carrie [name] understands and accepts admission to the hospital.  If Carrie [name] is agreeable, we will keep her until Monday for PCI as Carrie [name] had rest pain in the JV lab, and again in the cath lab.  No noted ST changes in the lab.

## 2012-03-23 NOTE — Progress Notes (Signed)
ANTICOAGULATION CONSULT NOTE - Initial Consult  Pharmacy Consult for Heparin Indication: Anticoagulation while awaiting PCI  Allergies  Allergen Reactions  . Aspirin     REACTION: hives  . Codeine     REACTION: rash/itch  . Metoclopramide Hcl     REACTION: intolerant, restless hands  . Morphine     REACTION: hives  . Penicillins     REACTION: rash/itch  . Sulfonamide Derivatives     REACTION: rash/itch    Patient Measurements: Height: 5\' 7"  (170.2 cm) Weight: 180 lb (81.647 kg) IBW/kg (Calculated) : 61.6  Heparin Dosing Weight: 78 kg  Vital Signs: BP: 141/83 mmHg (10/04 1119)  Labs: No results found for this basename: HGB:2,HCT:3,PLT:3,APTT:3,LABPROT:3,INR:3,HEPARINUNFRC:3,CREATININE:3,CKTOTAL:3,CKMB:3,TROPONINI:3 in the last 72 hours  Estimated Creatinine Clearance: 71.9 ml/min (by C-G formula based on Cr of 0.73).   Medical History: Past Medical History  Diagnosis Date  . Cough   . Hyperlipidemia   . Anxiety   . Chronic asthma   . Atherosclerosis of artery   . Persistent headaches     Assessment: 71 y.o. F with recent history of chest pressure, severe diaphoresis, and syncope. It was originally planned to do a stress myoview study however it wasn't felt like this could be tolerated and the patient was sent for cardiac cath. The cath revealed single vessel CAD involving the mid-LAD. The patient is planned to go back to cath for a PCI later today. Pharmacy has been consulted to dose heparin while awaiting PCI today.  The patient is noted to still have a sheath in place. Per the nurse report, the physician is concerned for this clotting and wants heparin on board. Heparin dosing weight ~78 kg, no labs drawn this admission however prior labs shows good renal function (SCr <1) and stable Hgb/Hct/Plt.   Goal of Therapy:  Heparin level 0.3-0.7 units/ml Monitor platelets by anticoagulation protocol: Yes   Plan:  1. Initiate heparin at rate of 950 units/hr (9.5  ml/hr) 2. Will continue to monitor for any signs/symptoms of bleeding and will follow up with heparin level in 8 hours or will f/u plans post-cath today  Georgina Pillion, PharmD, BCPS Clinical Pharmacist Pager: [phone] 03/23/2012 11:32 AM

## 2012-03-23 NOTE — Interval H&P Note (Signed)
History and Physical Interval Note:  03/23/2012 9:24 AM  Carrie [name]  has presented today for surgery, with the diagnosis of chest pain  The various methods of treatment have been discussed with the patient and family. After consideration of risks, benefits and other options for treatment, the patient has consented to  Procedure(s) (LRB) with comments: JV LEFT HEART CATHETERIZATION WITH CORONARY ANGIOGRAM (N/A) as a surgical intervention .  The patient's history has been reviewed, patient examined, no change in status, stable for surgery.  I have reviewed the patient's chart and labs.  Questions were answered to the patient's satisfaction.     Elyn Aquas.

## 2012-03-24 ENCOUNTER — Inpatient Hospital Stay (HOSPITAL_COMMUNITY): Payer: Medicare Other

## 2012-03-24 ENCOUNTER — Encounter (HOSPITAL_COMMUNITY): Payer: Self-pay | Admitting: *Deleted

## 2012-03-24 DIAGNOSIS — M79609 Pain in unspecified limb: Secondary | ICD-10-CM

## 2012-03-24 DIAGNOSIS — I200 Unstable angina: Secondary | ICD-10-CM

## 2012-03-24 DIAGNOSIS — R55 Syncope and collapse: Secondary | ICD-10-CM

## 2012-03-24 LAB — CBC
HCT: 38.5 % (ref 36.0–46.0)
Hemoglobin: 12.3 g/dL (ref 12.0–15.0)
MCHC: 31.9 g/dL (ref 30.0–36.0)
MCV: 93.2 fL (ref 78.0–100.0)
RDW: 14 % (ref 11.5–15.5)

## 2012-03-24 LAB — BASIC METABOLIC PANEL
BUN: 10 mg/dL (ref 6–23)
CO2: 27 mEq/L (ref 19–32)
Chloride: 104 mEq/L (ref 96–112)
Creatinine, Ser: 0.74 mg/dL (ref 0.50–1.10)
Glucose, Bld: 120 mg/dL — ABNORMAL HIGH (ref 70–99)
Potassium: 4.1 mEq/L (ref 3.5–5.1)

## 2012-03-24 LAB — PROTIME-INR: Prothrombin Time: 12.3 seconds (ref 11.6–15.2)

## 2012-03-24 MED ORDER — MOMETASONE FURO-FORMOTEROL FUM 200-5 MCG/ACT IN AERO: 2.0000 | INHALATION_SPRAY | Freq: Three times a day (TID) | RESPIRATORY_TRACT | Status: DC

## 2012-03-24 MED ORDER — HEPARIN (PORCINE) IN NACL 100-0.45 UNIT/ML-% IJ SOLN
1050.0000 [IU]/h | INTRAMUSCULAR | Status: DC
  Administered 2012-03-24 – 2012-03-25 (×2): 1050 [IU]/h via INTRAVENOUS
  Filled 2012-03-24 (×5): qty 250

## 2012-03-24 NOTE — Progress Notes (Signed)
  Echocardiogram 2D Echocardiogram has been performed.  Carrie [name] FRANCES 03/24/2012, 4:40 PM

## 2012-03-24 NOTE — Progress Notes (Signed)
*  Preliminary Results* Bilateral lower extremity venous duplex completed. Bilateral lower extremities are negative for deep vein thrombosis. Preliminary results discussed with Elon Jester, RN.  03/24/2012 6:30 PM Gertie Fey, RDMS, RDCS

## 2012-03-24 NOTE — Progress Notes (Addendum)
Subjective:  No further chest pain.  Developed tooth pain.  It has bothered her for awhile.  Better after tylenol.  Will get a dental xray today.  Probably a clopidogrel non responsder, and with ASA allergy, will need to consider prasugrel as primary option.  She is not a candidate for ticagrelor due to asthma.    Objective:  Vital Signs in the last 24 hours: Temp:  [98.1 F (36.7 C)-99.3 F (37.4 C)] 98.1 F (36.7 C) (10/05 0400) Pulse Rate:  [68-92] 71  (10/05 0551) Resp:  [16-24] 20  (10/05 0551) BP: (114-142)/(57-88) 133/80 mmHg (10/05 0400) SpO2:  [93 %-100 %] 97 % (10/05 0551) Weight:  [180 lb (81.647 kg)] 180 lb (81.647 kg) (10/04 0852)  Intake/Output from previous day: 10/04 0701 - 10/05 0700 In: 762.1 [P.O.:225; I.V.:537.1] Out: -    Physical Exam: General: Well developed, well nourished, in no acute distress. Head:  Normocephalic and atraumatic.  Has multiple fillings on R upper maxilla but no obvious irritated gum.   Lungs: Clear to auscultation and percussion.  No wheezing.  Heart: Normal S1 and S2.  No murmur, rubs or gallops.  Pulses: Pulses normal in all 4 extremities. Extremities: No clubbing or cyanosis. No edema. Neurologic: Alert and oriented x 3.    Lab Results: No results found for this basename: WBC:2,HGB:2,PLT:2 in the last 72 hours No results found for this basename: NA:2,K:2,CL:2,CO2:2,GLUCOSE:2,BUN:2,CREATININE:2 in the last 72 hours No results found for this basename: TROPONINI:2,CK,MB:2 in the last 72 hours Hepatic Function Panel No results found for this basename: PROT,ALBUMIN,AST,ALT,ALKPHOS,BILITOT,BILIDIR,IBILI in the last 72 hours No results found for this basename: CHOL in the last 72 hours No results found for this basename: PROTIME in the last 72 hours  Imaging: No results found.   Assessment/Plan:  1.  Unstable angina  -- pain at rest yesterday with no definite ECG change, but high grade LAD 2.  Clopidogrel hyporesponsiveness and ASA  allergy. 3.  Recurrent syncope 4.  Tooth pain 5.  Asthma  Plan  1.  panorex to assess teeth and dental consult on Monday 2.  Probably change to prasugrel if no dental abscess.  If so, may be better to deal with this first.  3.  Transfer to telemetry 4.  Low dose UFH. 5.  History of DVT with new syncope--check 2d echo, d-dimer, and consider CT if elevated.   6.  Recheck leg dopplers.         Shawnie Pons, MD, [facility], FSCAI 03/24/2012, 7:41 AM   r

## 2012-03-24 NOTE — Progress Notes (Signed)
ANTICOAGULATION CONSULT NOTE - Initial Consult  Pharmacy Consult for Heparin Indication:   Allergies  Allergen Reactions  . Aspirin     REACTION: hives  . Codeine     REACTION: rash/itch  . Metoclopramide Hcl     REACTION: intolerant, restless hands  . Morphine     REACTION: hives  . Penicillins     REACTION: rash/itch  . Sulfonamide Derivatives     REACTION: rash/itch    Patient Measurements:   Heparin Dosing Weight: 78 kg  Vital Signs: Temp: 98.1 F (36.7 C) (10/05 0752) Temp src: Oral (10/05 0752) BP: 140/81 mmHg (10/05 0752) Pulse Rate: 68  (10/05 0752)  Labs: No results found for this basename: HGB:2,HCT:3,PLT:3,APTT:3,LABPROT:3,INR:3,HEPARINUNFRC:3,CREATININE:3,CKTOTAL:3,CKMB:3,TROPONINI:3 in the last 72 hours  The CrCl is unknown because both a height and weight (above a minimum accepted value) are required for this calculation.   Medical History: Past Medical History  Diagnosis Date  . Cough   . Hyperlipidemia   . Anxiety   . Chronic asthma   . Atherosclerosis of artery   . Persistent headaches   . Shortness of breath   . PONV (postoperative nausea and vomiting) 1983    Assessment: 71 y.o. F with recent history of chest pressure, severe diaphoresis, and syncope.Cath revealed single vessel CAD involving the mid-LAD + stent. Likely Plavix non-responder. F/u dental pain with X-ray on Monday and may start Prasugrel then. MD wants "low dose" UFH. No baseline labs noted.  Goal of Therapy:  Heparin level 0.3-0.5 units/ml. No bolus. Monitor platelets by anticoagulation protocol: Yes  Goal of Therapy:  Heparin level 0.3-0.5. No bolus Monitor platelets by anticoagulation protocol: Yes   Plan: First must obtain baseline labs. 1. Initiate heparin at rate of 1050 units/hr (no bolus) 2. Check heparin level in 6 hrs and daily.   Romey Mathieson S. Merilynn Finland, PharmD, [facility] Clinical Staff Pharmacist Pager [phone]  03/24/2012 2:06 PM

## 2012-03-25 ENCOUNTER — Inpatient Hospital Stay (HOSPITAL_COMMUNITY): Payer: Medicare Other

## 2012-03-25 DIAGNOSIS — I2510 Atherosclerotic heart disease of native coronary artery without angina pectoris: Secondary | ICD-10-CM | POA: Diagnosis present

## 2012-03-25 DIAGNOSIS — F329 Major depressive disorder, single episode, unspecified: Secondary | ICD-10-CM | POA: Diagnosis present

## 2012-03-25 DIAGNOSIS — I82409 Acute embolism and thrombosis of unspecified deep veins of unspecified lower extremity: Secondary | ICD-10-CM

## 2012-03-25 LAB — CBC
Hemoglobin: 11.8 g/dL — ABNORMAL LOW (ref 12.0–15.0)
MCH: 29.7 pg (ref 26.0–34.0)
MCV: 93.7 fL (ref 78.0–100.0)
Platelets: 226 10*3/uL (ref 150–400)
RBC: 3.97 MIL/uL (ref 3.87–5.11)
WBC: 9.1 10*3/uL (ref 4.0–10.5)

## 2012-03-25 LAB — LIPID PANEL
Cholesterol: 157 mg/dL (ref 0–200)
Total CHOL/HDL Ratio: 2.2 RATIO
VLDL: 22 mg/dL (ref 0–40)

## 2012-03-25 LAB — HEPARIN LEVEL (UNFRACTIONATED): Heparin Unfractionated: 0.47 IU/mL (ref 0.30–0.70)

## 2012-03-25 MED ORDER — ATORVASTATIN CALCIUM 80 MG PO TABS
80.0000 mg | ORAL_TABLET | Freq: Every day | ORAL | Status: DC
  Administered 2012-03-25 – 2012-03-27 (×3): 80 mg via ORAL
  Filled 2012-03-25 (×4): qty 1

## 2012-03-25 MED ORDER — SODIUM CHLORIDE 0.9 % IV SOLN
INTRAVENOUS | Status: DC
  Administered 2012-03-25: 10 mL/h via INTRAVENOUS

## 2012-03-25 MED ORDER — SODIUM CHLORIDE 0.9 % IV SOLN
INTRAVENOUS | Status: AC
  Administered 2012-03-25: 10:00:00 via INTRAVENOUS

## 2012-03-25 MED ORDER — IOHEXOL 350 MG/ML SOLN
75.0000 mL | Freq: Once | INTRAVENOUS | Status: AC | PRN
  Administered 2012-03-25: 75 mL via INTRAVENOUS

## 2012-03-25 MED ORDER — LORAZEPAM 1 MG PO TABS
1.0000 mg | ORAL_TABLET | Freq: Once | ORAL | Status: AC
  Administered 2012-03-25: 1 mg via ORAL
  Filled 2012-03-25: qty 1

## 2012-03-25 NOTE — Progress Notes (Signed)
ANTICOAGULATION CONSULT NOTE - Follow Up Consult  Pharmacy Consult for Heparin Indication: R/o ACS  Allergies  Allergen Reactions  . Aspirin     REACTION: hives  . Codeine     REACTION: rash/itch  . Metoclopramide Hcl     REACTION: intolerant, restless hands  . Morphine     REACTION: hives  . Penicillins     REACTION: rash/itch  . Sulfonamide Derivatives     REACTION: rash/itch    Patient Measurements: Height: 5\' 7"  (170.2 cm) Weight: 179 lb 14.3 oz (81.6 kg) IBW/kg (Calculated) : 61.6  Heparin Dosing Weight: 78 kg  Vital Signs: Temp: 98.3 F (36.8 C) (10/05 2026) Temp src: Oral (10/05 2026) BP: 138/70 mmHg (10/05 2026) Pulse Rate: 78  (10/05 2026)  Labs:  Basename 03/24/12 2332 03/24/12 1415  HGB -- 12.3  HCT -- 38.5  PLT -- 224  APTT -- 25  LABPROT -- 12.3  INR -- 0.92  HEPARINUNFRC 0.38 --  CREATININE -- 0.74  CKTOTAL -- --  CKMB -- --  TROPONINI -- --    Estimated Creatinine Clearance: 71.9 ml/min (by C-G formula based on Cr of 0.74).   Medical History: Past Medical History  Diagnosis Date  . Cough   . Hyperlipidemia   . Anxiety   . Chronic asthma   . Atherosclerosis of artery   . Persistent headaches   . Shortness of breath   . PONV (postoperative nausea and vomiting) 1983    Assessment: 71 y.o. F with recent history of chest pressure, severe diaphoresis, and syncope.Cath revealed single vessel CAD involving the mid-LAD + stent. Likely Plavix non-responder. F/u dental pain with X-ray on Monday and may start Prasugrel then. MD wants "low dose" UFH. No baseline labs noted.  Heparin level (0.38) is at-goal on 1050 units/hr.   Goal of Therapy:  Heparin level 0.3-0.5 units/ml. No bolus. Monitor platelets by anticoagulation protocol: Yes  Goal of Therapy:  Heparin level 0.3-0.5. No bolus Monitor platelets by anticoagulation protocol: Yes   Plan: 1. Continue IV heparin at 1050 units/hr 2. Daily CBC, heparin level.   Lorre Munroe,  PharmD 03/25/2012 12:12 AM

## 2012-03-25 NOTE — Progress Notes (Addendum)
ANTICOAGULATION CONSULT NOTE - Follow Up Consult  Pharmacy Consult for Heparin Indication: R/o ACS  Allergies  Allergen Reactions  . Aspirin     REACTION: hives  . Codeine     REACTION: rash/itch  . Metoclopramide Hcl     REACTION: intolerant, restless hands  . Morphine     REACTION: hives  . Penicillins     REACTION: rash/itch  . Sulfonamide Derivatives     REACTION: rash/itch    Patient Measurements: Height: 5\' 7"  (170.2 cm) Weight: 179 lb 14.3 oz (81.6 kg) IBW/kg (Calculated) : 61.6  Heparin Dosing Weight: 78 kg  Vital Signs: Temp: 98.7 F (37.1 C) (10/06 1401) Temp src: Oral (10/06 1401) BP: 134/68 mmHg (10/06 1401) Pulse Rate: 79  (10/06 1401)  Labs:  Basename 03/25/12 0519 03/24/12 2332 03/24/12 1415  HGB 11.8* -- 12.3  HCT 37.2 -- 38.5  PLT 226 -- 224  APTT -- -- 25  LABPROT -- -- 12.3  INR -- -- 0.92  HEPARINUNFRC 0.47 0.38 --  CREATININE -- -- 0.74  CKTOTAL -- -- --  CKMB -- -- --  TROPONINI -- -- --    Estimated Creatinine Clearance: 71.9 ml/min (by C-G formula based on Cr of 0.74).   Medical History: Past Medical History  Diagnosis Date  . Cough   . Hyperlipidemia   . Anxiety   . Chronic asthma   . Atherosclerosis of artery   . Persistent headaches   . Shortness of breath   . PONV (postoperative nausea and vomiting) 1983    Assessment: MD has increased goal for heparin level from 0.3-0.5 to 0.3-0.7 for confirmed PE.  Heparin level of 0.47 is therapeutic for +PE on current IV heparin rate of 1050 units/hr in this 71 y.o. Female.  No bleeding reported.    Goal of Therapy:  Heparin level 0.3-0.7 units/ml (NO Boluses per MD) Monitor platelets by anticoagulation protocol: Yes    Plan: 1. Continue IV heparin at 1050 units/hr 2. Daily CBC, heparin level.   Noah Delaine, RPh Clinical Pharmacist Pager: [phone] 03/25/2012 6:09 PM

## 2012-03-25 NOTE — Progress Notes (Signed)
Discussed in detail with patient.  She has had recurrent syncope.  This likely is not explained by her CAD.  Her D dimer is markedly elevated.  Given prior DVT and presentation, limitation with antiplatelet agents, I think we need to exclude PE as a co conspirator here.  She has had both SOB and syncope as well.  Discussed and explained to the patient in detail.

## 2012-03-25 NOTE — Progress Notes (Signed)
ANTICOAGULATION CONSULT NOTE - Follow Up Consult  Pharmacy Consult for Heparin Indication: R/o ACS  Allergies  Allergen Reactions  . Aspirin     REACTION: hives  . Codeine     REACTION: rash/itch  . Metoclopramide Hcl     REACTION: intolerant, restless hands  . Morphine     REACTION: hives  . Penicillins     REACTION: rash/itch  . Sulfonamide Derivatives     REACTION: rash/itch    Patient Measurements: Height: 5\' 7"  (170.2 cm) Weight: 179 lb 14.3 oz (81.6 kg) IBW/kg (Calculated) : 61.6  Heparin Dosing Weight: 78 kg  Vital Signs: Temp: 98.7 F (37.1 C) (10/06 1401) Temp src: Oral (10/06 1401) BP: 134/68 mmHg (10/06 1401) Pulse Rate: 79  (10/06 1401)  Labs:  Basename 03/25/12 0519 03/24/12 2332 03/24/12 1415  HGB 11.8* -- 12.3  HCT 37.2 -- 38.5  PLT 226 -- 224  APTT -- -- 25  LABPROT -- -- 12.3  INR -- -- 0.92  HEPARINUNFRC 0.47 0.38 --  CREATININE -- -- 0.74  CKTOTAL -- -- --  CKMB -- -- --  TROPONINI -- -- --    Estimated Creatinine Clearance: 71.9 ml/min (by C-G formula based on Cr of 0.74).   Medical History: Past Medical History  Diagnosis Date  . Cough   . Hyperlipidemia   . Anxiety   . Chronic asthma   . Atherosclerosis of artery   . Persistent headaches   . Shortness of breath   . PONV (postoperative nausea and vomiting) 1983    Assessment: 71 y.o. F with recent history of chest pressure, severe diaphoresis, and syncope.Cath revealed single vessel CAD involving the mid-LAD + stent. Likely Plavix non-responder. F/u dental pain with X-ray on Monday and may start Prasugrel then. MD wants "low dose" UFH.   Heparin level (0.47) is at-goal on 1050 units/hr.   Goal of Therapy:  Heparin level 0.3-0.5 units/ml. No bolus. Monitor platelets by anticoagulation protocol: Yes  Goal of Therapy:  Heparin level 0.3-0.5. No bolus Monitor platelets by anticoagulation protocol: Yes   Plan: 1. Continue IV heparin at 1050 units/hr 2. Daily CBC, heparin  level.   Lorre Munroe, PharmD 03/25/2012 2:33 PM

## 2012-03-25 NOTE — Progress Notes (Signed)
Ambulated patient from the bed to the bathroom during night shift. Patient states that she will have her daughter bring her ortho boot which supports her and makes her feel more comfortable when walking distances. Will continue to monitor.

## 2012-03-25 NOTE — Progress Notes (Signed)
Author called by CT staff and informed that order for CT angio with contrast to r/o PE should be re-entered to reflect correct test MD desires. Also, patient needs larger gauge IV, is a difficult stick, and would like IVT to place. Notified IVT, will start another IV. Will notify CT when IV is started so that patient can be taken for test. Updated patient and family. Harlow Asa

## 2012-03-25 NOTE — Progress Notes (Signed)
Carrie [name]  71 y.o.  female  Subjective: Patient is felt fine since yesterday with no recurrent chest discomfort. She reports that dental pain has ceased and the Panorex results were good and is eager to proceed with intervention.  Allergy: Aspirin; Codeine; Metoclopramide hcl; Morphine; Penicillins; and Sulfonamide derivatives  Allergy to aspirin-urticaria resulting in hospital admission.  Objective: Vital signs in last 24 hours: Temp:  [97.9 F (36.6 C)-98.4 F (36.9 C)] 97.9 F (36.6 C) (10/06 0440) Pulse Rate:  [68-78] 77  (10/06 0440) Resp:  [18-20] 18  (10/06 0440) BP: (126-138)/(65-70) 126/68 mmHg (10/06 0440) SpO2:  [97 %-100 %] 97 % (10/06 0755) Weight:  [81.6 kg (179 lb 14.3 oz)] 81.6 kg (179 lb 14.3 oz) (10/05 1030)  81.6 kg (179 lb 14.3 oz) Body mass index is 28.18 kg/(m^2).  Weight change:  Last BM Date: 03/23/12  Intake/Output from previous day: 10/05 0701 - 10/06 0700 In: 966 [P.O.:840; I.V.:126] Out: -   General- Well developed; no acute distress; overweight Neck- No JVD, no carotid bruits Lungs- clear lung fields; prolonged expiratory phase; mild kyphosis; increased AP diameter Cardiovascular- normal PMI; normal S1 and S2; grade 1-2/6 basilar systolic ejection murmur  Abdomen- normal bowel sounds; soft and non-tender without masses or organomegaly Skin- Warm, no significant lesions Extremities- no edema  Lab Results: Cardiac Markers:  No results found for this basename: TROPONINI:2,CK,MB:2 in the last 72 hours CBC:   Basename 03/25/12 0519 03/24/12 1415  WBC 9.1 11.2*  HGB 11.8* 12.3  HCT 37.2 38.5  PLT 226 224   BMET:  Basename 03/24/12 1415  NA 139  K 4.1  CL 104  CO2 27  GLUCOSE 120*  BUN 10  CREATININE 0.74  CALCIUM 9.1   GFR:  Estimated Creatinine Clearance: 71.9 ml/min (by C-G formula based on Cr of 0.74).  Imaging Studies/Results: Dg Orthopantogram  03/24/2012  *RADIOLOGY REPORT*  Clinical Data: Tooth pain.  ORTHOPANTOGRAM/PANORAMIC   Comparison: CT of the head 09/23/2010  Findings: Multiple teeth are absent.  No evidence for significant caries.  No peri apical abscess identified.  Note is made of impaction of the left mandibular 3rd molar.  IMPRESSION: No evidence for abscess. Impacted left mandibular 3rd molar.   Original Report Authenticated By: Patterson Hammersmith, M.D.    Imaging: Imaging results have been reviewed.  Report of echocardiogram is pending.  Medications: I have reviewed the patient's current medications.  Infusions:     . heparin 1,050 Units/hr (03/24/12 1639)    Assessment/Plan: Coronary artery disease: With negative Rx and resolution of symptoms, it appears that intervention could proceed safely on Monday-I will discuss with Dr. Riley Kill. Alternative antiplatelet regimen remains to be specified.  Heparin infusion continues. Hyperlipidemia: No lipid profile results available. Atorvastatin dosage will be increased to 80 mg per day and baseline lipid levels obtained.   LOS: 2 days   [place] 03/25/2012, 8:41 AM

## 2012-03-26 ENCOUNTER — Encounter (HOSPITAL_COMMUNITY)
Admission: AD | Disposition: A | Discharge status: Home / Self Care | Payer: Self-pay | Source: Ambulatory Visit | Attending: Cardiology

## 2012-03-26 ENCOUNTER — Encounter (HOSPITAL_BASED_OUTPATIENT_CLINIC_OR_DEPARTMENT_OTHER): Payer: Self-pay | Admitting: Anesthesiology

## 2012-03-26 ENCOUNTER — Encounter (HOSPITAL_COMMUNITY): Payer: Self-pay | Admitting: Anesthesiology

## 2012-03-26 DIAGNOSIS — I2699 Other pulmonary embolism without acute cor pulmonale: Secondary | ICD-10-CM

## 2012-03-26 DIAGNOSIS — I2510 Atherosclerotic heart disease of native coronary artery without angina pectoris: Secondary | ICD-10-CM

## 2012-03-26 DIAGNOSIS — R0789 Other chest pain: Secondary | ICD-10-CM

## 2012-03-26 HISTORY — PX: CORONARY STENT PLACEMENT: SHX1402

## 2012-03-26 HISTORY — PX: PERCUTANEOUS CORONARY STENT INTERVENTION (PCI-S): SHX5485

## 2012-03-26 LAB — CBC
Hemoglobin: 12.1 g/dL (ref 12.0–15.0)
MCH: 30.2 pg (ref 26.0–34.0)
Platelets: 249 10*3/uL (ref 150–400)
RBC: 4.01 MIL/uL (ref 3.87–5.11)
WBC: 9.3 10*3/uL (ref 4.0–10.5)

## 2012-03-26 LAB — HEPARIN LEVEL (UNFRACTIONATED)
Heparin Unfractionated: 0.64 IU/mL (ref 0.30–0.70)
Heparin Unfractionated: 0.42 IU/mL (ref 0.30–0.70)

## 2012-03-26 LAB — BASIC METABOLIC PANEL
CO2: 24 mEq/L (ref 19–32)
Chloride: 107 mEq/L (ref 96–112)
GFR calc Af Amer: >90 mL/min (ref >90)
Potassium: 4.2 mEq/L (ref 3.5–5.1)
Sodium: 141 mEq/L (ref 135–145)

## 2012-03-26 LAB — POCT ACTIVATED CLOTTING TIME: Activated Clotting Time: 349 seconds

## 2012-03-26 SURGERY — PERCUTANEOUS CORONARY STENT INTERVENTION (PCI-S)
Anesthesia: LOCAL

## 2012-03-26 MED ORDER — VERAPAMIL HCL 2.5 MG/ML IV SOLN
INTRAVENOUS | Status: AC
  Filled 2012-03-26: qty 2

## 2012-03-26 MED ORDER — LIDOCAINE HCL (PF) 1 % IJ SOLN
INTRAMUSCULAR | Status: AC
  Filled 2012-03-26: qty 30

## 2012-03-26 MED ORDER — MIDAZOLAM HCL 2 MG/2ML IJ SOLN
INTRAMUSCULAR | Status: AC
  Filled 2012-03-26: qty 2

## 2012-03-26 MED ORDER — WARFARIN VIDEO
Freq: Once | Status: AC
  Administered 2012-03-26: 18:00:00

## 2012-03-26 MED ORDER — ONDANSETRON HCL 4 MG/2ML IJ SOLN
INTRAMUSCULAR | Status: AC
  Filled 2012-03-26: qty 2

## 2012-03-26 MED ORDER — RANITIDINE HCL 150 MG PO TABS
300.0000 mg | ORAL_TABLET | Freq: Every day | ORAL | Status: DC
  Administered 2012-03-26 – 2012-03-27 (×2): 300 mg via ORAL
  Filled 2012-03-26 (×3): qty 2

## 2012-03-26 MED ORDER — FENTANYL CITRATE 0.05 MG/ML IJ SOLN
INTRAMUSCULAR | Status: AC
  Filled 2012-03-26: qty 2

## 2012-03-26 MED ORDER — SODIUM CHLORIDE 0.9 % IV SOLN
1.0000 mL/kg/h | INTRAVENOUS | Status: DC
  Administered 2012-03-26: 1 mL/kg/h via INTRAVENOUS

## 2012-03-26 MED ORDER — SODIUM CHLORIDE 0.9 % IV SOLN: 250.0000 mL | INTRAVENOUS | Status: DC | PRN

## 2012-03-26 MED ORDER — NITROGLYCERIN 0.2 MG/ML ON CALL CATH LAB
INTRAVENOUS | Status: AC
  Filled 2012-03-26: qty 1

## 2012-03-26 MED ORDER — BIVALIRUDIN 250 MG IV SOLR
INTRAVENOUS | Status: AC
  Filled 2012-03-26: qty 250

## 2012-03-26 MED ORDER — HEPARIN (PORCINE) IN NACL 100-0.45 UNIT/ML-% IJ SOLN
1050.0000 [IU]/h | INTRAMUSCULAR | Status: DC
  Administered 2012-03-26 – 2012-03-27 (×2): 1050 [IU]/h via INTRAVENOUS
  Filled 2012-03-26 (×4): qty 250

## 2012-03-26 MED ORDER — SODIUM CHLORIDE 0.9 % IJ SOLN: 3.0000 mL | INTRAMUSCULAR | Status: DC | PRN

## 2012-03-26 MED ORDER — SODIUM CHLORIDE 0.9 % IJ SOLN: 3.0000 mL | Freq: Two times a day (BID) | INTRAMUSCULAR | Status: DC

## 2012-03-26 MED ORDER — PRASUGREL HCL 10 MG PO TABS
60.0000 mg | ORAL_TABLET | Freq: Once | ORAL | Status: AC
  Administered 2012-03-26: 60 mg via ORAL
  Filled 2012-03-26: qty 6

## 2012-03-26 MED ORDER — SODIUM CHLORIDE 0.9 % IV SOLN
INTRAVENOUS | Status: AC
  Administered 2012-03-26: 14:00:00 via INTRAVENOUS

## 2012-03-26 MED ORDER — COUMADIN BOOK
Freq: Once | Status: AC
  Administered 2012-03-26: 18:00:00
  Filled 2012-03-26: qty 1

## 2012-03-26 MED ORDER — WARFARIN - PHARMACIST DOSING INPATIENT: Freq: Every day | Status: DC

## 2012-03-26 MED ORDER — WARFARIN SODIUM 5 MG PO TABS
5.0000 mg | ORAL_TABLET | Freq: Once | ORAL | Status: AC
  Administered 2012-03-26: 5 mg via ORAL
  Filled 2012-03-26: qty 1

## 2012-03-26 NOTE — Progress Notes (Signed)
TR BAND REMOVAL  LOCATION:  left radial  DEFLATED PER PROTOCOL:  yes  TIME BAND OFF / DRESSING APPLIED:   1510   SITE UPON ARRIVAL:   Level 1  SITE AFTER BAND REMOVAL:  Level 1  REVERSE ALLEN'S TEST:    positive  CIRCULATION SENSATION AND MOVEMENT:  Within Normal Limits  yes  COMMENTS:  Upon arrival with band on fingers cool to touch and bluish in color. After deflation of band color returned to normal fingers warm to touch. Slight bruising down from the site soft to touch.

## 2012-03-26 NOTE — Progress Notes (Signed)
  ANTICOAGULATION CONSULT NOTE - Follow Up Consult  Pharmacy Consult for Heparin and Coumadin Indication: CTA confirmed PE  Allergies  Allergen Reactions  . Aspirin     REACTION: hives  . Codeine     REACTION: rash/itch  . Metoclopramide Hcl     REACTION: intolerant, restless hands  . Morphine     REACTION: hives  . Penicillins     REACTION: rash/itch  . Sulfonamide Derivatives     REACTION: rash/itch    Patient Measurements: Height: 5\' 7"  (170.2 cm) Weight: 179 lb 14.3 oz (81.6 kg) IBW/kg (Calculated) : 61.6  Heparin Dosing Weight: 78.4kg  Vital Signs: Temp: 98.8 F (37.1 C) (10/07 0435) Temp src: Oral (10/07 0435) BP: 149/77 mmHg (10/07 1004) Pulse Rate: 81  (10/07 1042)  Labs:  Basename 03/26/12 0550 03/25/12 0519 03/24/12 2332 03/24/12 1415  HGB 12.1 11.8* -- --  HCT 37.7 37.2 -- 38.5  PLT 249 226 -- 224  APTT -- -- -- 25  LABPROT -- -- -- 12.3  INR -- -- -- 0.92  HEPARINUNFRC 0.64 0.47 0.38 --  CREATININE 0.61 -- -- 0.74  CKTOTAL -- -- -- --  CKMB -- -- -- --  TROPONINI -- -- -- --    Estimated Creatinine Clearance: 71.9 ml/min (by C-G formula based on Cr of 0.61).  Assessment: 71yof s/p PCI to restart heparin drip and bridge to Coumadin for CTA confirmed bilateral PE. Per MD orders, restart heparin drip 4 hours post sheath pull - sheath pulled ~1130 per Cath report. Patient's heparin level (0.64) was therapeutic prior to cath this AM - will restart drip at previous rate with no bolus per MD orders.  - Baseline INR: 0.92 - H/H and Plts wnl - RN reported hematoma s/p cath is decreasing but monitoring closely - requested RN to notify MD and pharmacy if worsens. No other significant bleeding reported - Warfarin points: 3 - Heparin weight: 78.4kg  Goal of Therapy:  INR 2-3 Heparin level 0.3-0.7 units/ml Monitor platelets by anticoagulation protocol: Yes   Plan:  1. Restart heparin drip 1050 units/hr (10.5 ml/hr) @ 1530 today 2. Check heparin level 6  hours after drip restart 3. Coumadin 5mg  po x 1 today 4. Daily INR, heparin level and CBC 5. Coumadin educational book/video 6. Monitor for s/sx of bleeding  Cleon Dew [phone] 03/26/2012,1:29 PM

## 2012-03-26 NOTE — Interval H&P Note (Signed)
History and Physical Interval Note:  03/26/2012 10:23 AM  Carrie [name]  has presented today for PCI with the diagnosis of CAD. The various methods of treatment have been discussed with the patient and family. After consideration of risks, benefits and other options for treatment, the patient has consented to  Procedure(s) (LRB) with comments: PERCUTANEOUS CORONARY STENT INTERVENTION (PCI-S) (N/A) as a surgical intervention .  The patient's history has been reviewed, patient examined, no change in status, stable for surgery.  I have reviewed the patient's chart and labs.  Questions were answered to the patient's satisfaction.     Jairon Ripberger

## 2012-03-26 NOTE — Progress Notes (Signed)
Anticoagulation Consult Note: Heparin  Indication: PE  Heparin level = 0.42 on 1050 units/hr Goal heparin level = 0.3-0.7  Heparin level is within desired range. Continue heparin at same rate. F/U daily heparin level and CBC.  Cardell Peach, PharmD

## 2012-03-26 NOTE — Progress Notes (Signed)
Late entry for date and time specified. Received critical results of CTA of chest. Patient positive for bilateral pulmonary emboli. Paged MD as well as PA. See new orders entered. Patient and family anxious. Administered antianxiety meds per order. Spoke with pharmacist, per PharmD, no need to obtain another heparin level until scheduled time in AM. Changed therapeutic goals for PE per MD order. Carrie [name]

## 2012-03-26 NOTE — CV Procedure (Addendum)
   Cardiac Catheterization Operative Report  Carrie [name] [id number] 10/7/201311:30 AM Dwana Melena, MD  Procedure Performed:  1. PTCA/bare metal stent x 1 mid LAD 2. Angioseal femoral artery closure device RFA  Operator: Verne Carrow, MD  Indication:  Pt admitted with unstable angina, diagnostic cath 03/23/12 per Dr. Elease Hashimoto. She was found to have a severe stenosis in the mid LAD. PCI delayed secondary to pt being intolerant to ASA and hypo-responsiveness to Plavix. She has been loaded with Effient this am. She also has bilateral PE and is on heparin with plans for long term coumadin.                                       Procedure Details: The risks, benefits, complications, treatment options, and expected outcomes were discussed with the patient. The patient and/or family concurred with the proposed plan, giving informed consent. The patient was brought to the cath lab after IV hydration was begun and oral premedication was given. The patient was further sedated with Versed and Fentanyl. Allens test was negative in the right wrist. Allens test was positive in the left wrist. The left wrist was prepped and draped in a sterile fashion. I made multiple attempts to engage the left radial artery but was not able to pass the wire beyond the first 5 cm of the vessel. The right groin was prepped and draped in the usual manner. Using the modified Seldinger access technique, a 6 French sheath was placed in the right femoral artery. She was given a bolus of Angiomax and a drip was started. I then engaged the left main with a XB LAD 3.0 guiding catheter. When the ACT was greater than 200, I passed a Cougar IC wire down the LAD. I then pre-dilated with a 2.5 x 15 mm balloon. I then deployed a 3.5 x 24 mm Veriflex bare metal stent in the mid LAD. The distal portion of the stent was post-dilated with a 3.5 x 12 mm Merriam balloon. The proximal portion of the stent was post-dilated with a 3.75 x 8 mm Sargeant balloon.  There was an excellent result. There was some plaque shift into a very small septal perforating branch which was demonstrated with hangup of contrast in the mid portion of the stent. LAO/cranial images demonstrated this to be external to the LAD. The LAD stenosis was taken from 99% down to 0%. There was excellent flow into the distal vessel. An Angioseal femoral artery closure device was deployed in the right femoral artery.   There were no immediate complications. The patient was taken to the recovery area in stable condition.   Hemodynamic Findings: Central aortic pressure:  151/84  Impression: 1. Unstable angina 2. Successful PTCA/bare metal stent x 1 mid LAD  Recommendations: She will need Effient 10 mg po Qdaily for at least one month. Since she is intolerant to ASA and does not respond to Plavix, she will need Effient for lifetime, hopefully at lower dose of 5 mg per day after the first month following stent placement. Will resume heparin drip 4 hours post sheath pull. Will start coumadin tonight.        Complications:  None; patient tolerated the procedure well.

## 2012-03-26 NOTE — Progress Notes (Signed)
 PROGRESS NOTE  Subjective:   Carrie [name] is a 71 yo with recent Dx of CAD ( tight stenosis of the mid LAD), dx of Pulmonary embolus, ashtma,  She is for PCI today.  Pt also has some dental issues - no abscess.  Long discussion with Dr. Stuckey and Mcalhaney.    Objective:    Vital Signs:   Temp:  [98.7 F (37.1 C)-98.8 F (37.1 C)] 98.8 F (37.1 C) (10/07 0435) Pulse Rate:  [69-90] 69  (10/07 0435) Resp:  [18-19] 19  (10/07 0435) BP: (129-148)/(68-82) 129/78 mmHg (10/07 0435) SpO2:  [96 %-100 %] 96 % (10/07 0719)  Last BM Date: 03/23/12   24-hour weight change: Weight change:   Weight trends: Filed Weights   03/24/12 1030  Weight: 179 lb 14.3 oz (81.6 kg)    Intake/Output:  10/06 0701 - 10/07 0700 In: 240 [P.O.:240] Out: 700 [Urine:700]     Physical Exam: BP 129/78  Pulse 69  Temp 98.8 F (37.1 C) (Oral)  Resp 19  Ht 5' 7" (1.702 m)  Wt 179 lb 14.3 oz (81.6 kg)  BMI 28.18 kg/m2  SpO2 96%  General: Vital signs reviewed and noted. Well-developed, well-nourished, in no acute distress; alert, appropriate and cooperative .  Head: Normocephalic, atraumatic.  Eyes: conjunctivae/corneas clear.  EOM's intact.   Throat: normal  Neck: Supple. Normal carotids. No JVD  Lungs:  Slight wheeze, especially with cough.  Heart: Regular rate,  With normal  S1 S2. No murmurs, gallops or rubs  Abdomen:  Soft, non-tender, non-distended with normoactive bowel sounds. No hepatomegaly. No rebound/guarding. No abdominal masses.  Extremities: Distal pedal pulses are 2+ .  No edema.    Neurologic: A&O X3, CN II - XII are grossly intact. Motor strength is 5/5 in the all 4 extremities.  Psych: Responds to questions appropriately with normal affect.    Labs: BMET:  Basename 03/26/12 0550 03/24/12 1415  NA 141 139  K 4.2 4.1  CL 107 104  CO2 24 27  GLUCOSE 100* 120*  BUN 6 10  CREATININE 0.61 0.74  CALCIUM 9.0 9.1  MG -- --  PHOS -- --    Liver function tests: No results  found for this basename: AST:2,ALT:2,ALKPHOS:2,BILITOT:2,PROT:2,ALBUMIN:2 in the last 72 hours No results found for this basename: LIPASE:2,AMYLASE:2 in the last 72 hours  CBC:  Basename 03/26/12 0550 03/25/12 0519  WBC 9.3 9.1  NEUTROABS -- --  HGB 12.1 11.8*  HCT 37.7 37.2  MCV 94.0 93.7  PLT 249 226    Cardiac Enzymes: No results found for this basename: CKTOTAL:4,CKMB:4,TROPONINI:4 in the last 72 hours  Coagulation Studies:  Basename 03/24/12 1415  LABPROT 12.3  INR 0.92    Other: No components found with this basename: POCBNP:3  Basename 03/24/12 1415  DDIMER 2.50*   No results found for this basename: HGBA1C in the last 72 hours  Basename 03/25/12 1007  CHOL 157  HDL 71  LDLCALC 64  TRIG 109  CHOLHDL 2.2   No results found for this basename: TSH,T4TOTAL,FREET3,T3FREE,THYROIDAB in the last 72 hours No results found for this basename: VITAMINB12,FOLATE,FERRITIN,TIBC,IRON,RETICCTPCT in the last 72 hours    Tele:  NSr  Medications:    Infusions:    . sodium chloride 150 mL/hr at 03/25/12 1000  . sodium chloride 10 mL/hr (03/25/12 1803)  . heparin 1,050 Units/hr (03/25/12 1518)    Scheduled Medications:    . albuterol-ipratropium  1 puff Inhalation TID  . atorvastatin  80 mg   Oral q1800  . donepezil  5 mg Oral QHS  . ezetimibe  10 mg Oral QHS  . LORazepam  1 mg Oral Once  . Mometasone Furo-Formoterol Fum  2 puff Inhalation TID  . montelukast  10 mg Oral QHS  . multivitamin with minerals  1 tablet Oral Daily  . pantoprazole  40 mg Oral Q1200  . prasugrel  60 mg Oral Once  . raloxifene  60 mg Oral Daily  . ranitidine  300 mg Oral BID  . venlafaxine XR  75 mg Oral Q breakfast  . 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  plas is to load her with Effient.  She will get a bare metal stent and receive full  dose Effient for ~ 1 month.  She will need to be on life long coumadin ( or new agent) We will likely be able to lower her long term Effient dose to 5 mg a day.  Pulmonary embolus:  Has a hx of DVT.  She will need life long coumadin or newer agent.  Depression (03/25/2012) Continue meds for now  Disposition: PCI today  Length of Stay: 3  Khelani Kops J. Winda Summerall, Jr., MD, FACC 03/26/2012, 8:19 AM Office [phone] Pager [phone]    

## 2012-03-26 NOTE — H&P (View-Only) (Signed)
PROGRESS NOTE  Subjective:   Carrie [name] is a 71 yo with recent Dx of CAD ( tight stenosis of the mid LAD), dx of Pulmonary embolus, ashtma,  She is for PCI today.  Pt also has some dental issues - no abscess.  Long discussion with Dr. Riley Kill and Sanjuana Kava.    Objective:    Vital Signs:   Temp:  [98.7 F (37.1 C)-98.8 F (37.1 C)] 98.8 F (37.1 C) (10/07 0435) Pulse Rate:  [69-90] 69  (10/07 0435) Resp:  [18-19] 19  (10/07 0435) BP: (129-148)/(68-82) 129/78 mmHg (10/07 0435) SpO2:  [96 %-100 %] 96 % (10/07 0719)  Last BM Date: 03/23/12   24-hour weight change: Weight change:   Weight trends: Filed Weights   03/24/12 1030  Weight: 179 lb 14.3 oz (81.6 kg)    Intake/Output:  10/06 0701 - 10/07 0700 In: 240 [P.O.:240] Out: 700 [Urine:700]     Physical Exam: BP 129/78  Pulse 69  Temp 98.8 F (37.1 C) (Oral)  Resp 19  Ht 5\' 7"  (1.702 m)  Wt 179 lb 14.3 oz (81.6 kg)  BMI 28.18 kg/m2  SpO2 96%  General: Vital signs reviewed and noted. Well-developed, well-nourished, in no acute distress; alert, appropriate and cooperative .  Head: Normocephalic, atraumatic.  Eyes: conjunctivae/corneas clear.  EOM's intact.   Throat: normal  Neck: Supple. Normal carotids. No JVD  Lungs:  Slight wheeze, especially with cough.  Heart: Regular rate,  With normal  S1 S2. No murmurs, gallops or rubs  Abdomen:  Soft, non-tender, non-distended with normoactive bowel sounds. No hepatomegaly. No rebound/guarding. No abdominal masses.  Extremities: Distal pedal pulses are 2+ .  No edema.    Neurologic: A&O X3, CN II - XII are grossly intact. Motor strength is 5/5 in the all 4 extremities.  Psych: Responds to questions appropriately with normal affect.    Labs: BMET:  Basename 03/26/12 0550 03/24/12 1415  NA 141 139  K 4.2 4.1  CL 107 104  CO2 24 27  GLUCOSE 100* 120*  BUN 6 10  CREATININE 0.61 0.74  CALCIUM 9.0 9.1  MG -- --  PHOS -- --    Liver function tests: No results  found for this basename: AST:2,ALT:2,ALKPHOS:2,BILITOT:2,PROT:2,ALBUMIN:2 in the last 72 hours No results found for this basename: LIPASE:2,AMYLASE:2 in the last 72 hours  CBC:  Basename 03/26/12 0550 03/25/12 0519  WBC 9.3 9.1  NEUTROABS -- --  HGB 12.1 11.8*  HCT 37.7 37.2  MCV 94.0 93.7  PLT 249 226    Cardiac Enzymes: No results found for this basename: CKTOTAL:4,CKMB:4,TROPONINI:4 in the last 72 hours  Coagulation Studies:  Basename 03/24/12 1415  LABPROT 12.3  INR 0.92    Other: No components found with this basename: POCBNP:3  Basename 03/24/12 1415  DDIMER 2.50*   No results found for this basename: HGBA1C in the last 72 hours  Basename 03/25/12 1007  CHOL 157  HDL 71  LDLCALC 64  TRIG 109  CHOLHDL 2.2   No results found for this basename: TSH,T4TOTAL,FREET3,T3FREE,THYROIDAB in the last 72 hours No results found for this basename: VITAMINB12,FOLATE,FERRITIN,TIBC,IRON,RETICCTPCT in the last 72 hours    Tele:  NSr  Medications:    Infusions:    . sodium chloride 150 mL/hr at 03/25/12 1000  . sodium chloride 10 mL/hr (03/25/12 1803)  . heparin 1,050 Units/hr (03/25/12 1518)    Scheduled Medications:    . albuterol-ipratropium  1 puff Inhalation TID  . atorvastatin  80 mg  Oral q1800  . donepezil  5 mg Oral QHS  . ezetimibe  10 mg Oral QHS  . LORazepam  1 mg Oral Once  . Mometasone Furo-Formoterol Fum  2 puff Inhalation TID  . montelukast  10 mg Oral QHS  . multivitamin with minerals  1 tablet Oral Daily  . pantoprazole  40 mg Oral Q1200  . prasugrel  60 mg Oral Once  . raloxifene  60 mg Oral Daily  . ranitidine  300 mg Oral BID  . venlafaxine XR  75 mg Oral Q breakfast  . 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  plas is to load her with Effient.  She will get a bare metal stent and receive full  dose Effient for ~ 1 month.  She will need to be on life long coumadin ( or new agent) We will likely be able to lower her long term Effient dose to 5 mg a day.  Pulmonary embolus:  Has a hx of DVT.  She will need life long coumadin or newer agent.  Depression (03/25/2012) Continue meds for now  Disposition: PCI today  Length of Stay: 3  Vesta Mixer, Montez Hageman., MD, [facility] 03/26/2012, 8:19 AM Office [phone] Pager [phone]

## 2012-03-27 DIAGNOSIS — I2510 Atherosclerotic heart disease of native coronary artery without angina pectoris: Principal | ICD-10-CM

## 2012-03-27 DIAGNOSIS — J45909 Unspecified asthma, uncomplicated: Secondary | ICD-10-CM

## 2012-03-27 DIAGNOSIS — I200 Unstable angina: Secondary | ICD-10-CM

## 2012-03-27 LAB — HEPARIN LEVEL (UNFRACTIONATED): Heparin Unfractionated: 0.61 IU/mL (ref 0.30–0.70)

## 2012-03-27 LAB — CBC
HCT: 36.6 % (ref 36.0–46.0)
Hemoglobin: 11.8 g/dL — ABNORMAL LOW (ref 12.0–15.0)
MCHC: 32.2 g/dL (ref 30.0–36.0)
RDW: 14.5 % (ref 11.5–15.5)
WBC: 10.1 10*3/uL (ref 4.0–10.5)

## 2012-03-27 LAB — BASIC METABOLIC PANEL
BUN: 8 mg/dL (ref 6–23)
Chloride: 108 mEq/L (ref 96–112)
GFR calc Af Amer: >90 mL/min (ref >90)
GFR calc non Af Amer: 87 mL/min — ABNORMAL LOW (ref >90)
Glucose, Bld: 112 mg/dL — ABNORMAL HIGH (ref 70–99)
Potassium: 4.2 mEq/L (ref 3.5–5.1)
Sodium: 141 mEq/L (ref 135–145)

## 2012-03-27 LAB — PROTIME-INR: INR: 0.96 (ref 0.00–1.49)

## 2012-03-27 MED ORDER — PRASUGREL HCL 10 MG PO TABS
10.0000 mg | ORAL_TABLET | Freq: Every day | ORAL | Status: DC
  Administered 2012-03-27 – 2012-03-28 (×2): 10 mg via ORAL
  Filled 2012-03-27 (×2): qty 1

## 2012-03-27 MED ORDER — WARFARIN SODIUM 5 MG PO TABS
5.0000 mg | ORAL_TABLET | Freq: Once | ORAL | Status: AC
  Administered 2012-03-27: 5 mg via ORAL
  Filled 2012-03-27: qty 1

## 2012-03-27 MED ORDER — BISACODYL 5 MG PO TBEC: 5.0000 mg | DELAYED_RELEASE_TABLET | Freq: Every day | ORAL | Status: DC | PRN

## 2012-03-27 MED ORDER — BISACODYL 5 MG PO TBEC
5.0000 mg | DELAYED_RELEASE_TABLET | Freq: Every day | ORAL | Status: DC | PRN
  Administered 2012-03-27: 5 mg via ORAL
  Filled 2012-03-27 (×2): qty 1

## 2012-03-27 MED ORDER — MOMETASONE FURO-FORMOTEROL FUM 200-5 MCG/ACT IN AERO
2.0000 | INHALATION_SPRAY | Freq: Three times a day (TID) | RESPIRATORY_TRACT | Status: DC
  Administered 2012-03-27 – 2012-03-28 (×4): 2 via RESPIRATORY_TRACT

## 2012-03-27 MED FILL — Dextrose Inj 5%: INTRAVENOUS | Qty: 50 | Status: AC

## 2012-03-27 NOTE — Progress Notes (Signed)
CARDIAC REHAB PHASE I   PRE:  Rate/Rhythm: 86SR  BP:  Supine:   Sitting: 138/79  Standing:    SaO2: 100%RA  MODE:  Ambulation: 180 ft   POST:  Rate/Rhythem: 120ST  BP:  Supine:   Sitting: 140/87  Standing:    SaO2: 97%RA  1245-1314 Pt walked 180 ft with gait belt and rolling walker after left boot put on foot. Tolerated well for first walk. Sweating during walk but no CP or dizziness. To recliner after walk with call bell.  Duanne Limerick

## 2012-03-27 NOTE — Progress Notes (Signed)
Patient transferred from 2900, states prior to transfer she had left breast pain, pins and needles that comes all of a sudden, then goes right away.  Denies pain with inspiration.  [facility] PA paged and notified.  No new orders received.  Asked patient to notify staff if pain does not go away per PA recommendations.  Will continue to monitor.Carrie [name]

## 2012-03-27 NOTE — Progress Notes (Signed)
UR done. 

## 2012-03-27 NOTE — Progress Notes (Signed)
SUBJECTIVE: Feeling great. No complaints.   BP 123/62  Pulse 72  Temp 98.7 F (37.1 C) (Oral)  Resp 22  Ht 5\' 7"  (1.702 m)  Wt 188 lb 11.4 oz (85.6 kg)  BMI 29.56 kg/m2  SpO2 98%  Intake/Output Summary (Last 24 hours) at 03/27/12 4696 Last data filed at 03/27/12 0700  Gross per 24 hour  Intake  374.5 ml  Output      0 ml  Net  374.5 ml    PHYSICAL EXAM General: Well developed, well nourished, in no acute distress. Alert and oriented x 3.  Psych:  Good affect, responds appropriately Neck: No JVD. No masses noted.  Lungs: Clear bilaterally with no wheezes or rhonci noted.  Heart: RRR with no murmurs noted. Abdomen: Bowel sounds are present. Soft, non-tender.  Extremities: No lower extremity edema.   LABS: Basic Metabolic Panel:  Basename 03/27/12 0530 03/26/12 0550  NA 141 141  K 4.2 4.2  CL 108 107  CO2 23 24  GLUCOSE 112* 100*  BUN 8 6  CREATININE 0.67 0.61  CALCIUM 9.1 9.0  MG -- --  PHOS -- --   CBC:  Basename 03/27/12 0530 03/26/12 0550  WBC 10.1 9.3  NEUTROABS -- --  HGB 11.8* 12.1  HCT 36.6 37.7  MCV 93.8 94.0  PLT 225 249   Fasting Lipid Panel:  Basename 03/25/12 1007  CHOL 157  HDL 71  LDLCALC 64  TRIG 109  CHOLHDL 2.2  LDLDIRECT --    Current Meds:    . albuterol-ipratropium  1 puff Inhalation TID  . atorvastatin  80 mg Oral q1800  . bivalirudin      . coumadin book   Does not apply Once  . donepezil  5 mg Oral QHS  . ezetimibe  10 mg Oral QHS  . fentaNYL      . fentaNYL      . lidocaine      . midazolam      . midazolam      . Mometasone Furo-Formoterol Fum  2 puff Inhalation TID  . montelukast  10 mg Oral QHS  . multivitamin with minerals  1 tablet Oral Daily  . nitroGLYCERIN      . ondansetron      . pantoprazole  40 mg Oral Q1200  . prasugrel  60 mg Oral Once  . raloxifene  60 mg Oral Daily  . ranitidine  300 mg Oral QHS  . venlafaxine XR  75 mg Oral Q breakfast  . verapamil      . warfarin  5 mg Oral  ONCE-1800  . warfarin   Does not apply Once  . Warfarin - Pharmacist Dosing Inpatient   Does not apply q1800  . DISCONTD: ranitidine  300 mg Oral BID  . DISCONTD: sodium chloride  3 mL Intravenous Q12H     ASSESSMENT AND PLAN:  1. CAD: Found by diagnostic cath 03/23/12 per Dr. Elease Hashimoto to have severe stenosis in the mid LAD with chest pain c/w unstable angina. S/p PCI with placement bare metal stent mid LAD yesterday. Bare metal stent was placed secondary to her need for long term anti-coagulation secondary to PE. She is ASA intolerant and a hypo-responder to Plavix. She has been loaded with Effient and is now on Effient 10 mg po Qdaily.  Will hopefully be able to lower her long term Effient dose to 5 mg a day.   2. Pulmonary embolus: Has a hx of DVT. She  will need life long coumadin or newer agent. Plan is to keep her in the hospital until her INR is therapeutic.   3. Depression:  Continue meds for now  4.  Intrinsic asthma, unspecified: Continue current asthma meds. OK to use her inhalers as she does at home. See orders.    Carrie [name]  10/8/20138:12 AM

## 2012-03-27 NOTE — Progress Notes (Signed)
  ANTICOAGULATION CONSULT NOTE - Follow Up Consult  Pharmacy Consult for Heparin and Coumadin Indication: CTA confirmed bilateral PE  Allergies  Allergen Reactions  . Aspirin     REACTION: hives  . Codeine     REACTION: rash/itch  . Metoclopramide Hcl     REACTION: intolerant, restless hands  . Morphine     REACTION: hives  . Penicillins     REACTION: rash/itch  . Sulfonamide Derivatives     REACTION: rash/itch    Patient Measurements: Height: 5\' 7"  (170.2 cm) Weight: 188 lb 11.4 oz (85.6 kg) IBW/kg (Calculated) : 61.6  Heparin Dosing Weight: 79.6 kg  Vital Signs: Temp: 98.7 F (37.1 C) (10/08 0540) Temp src: Oral (10/08 0540) BP: 123/62 mmHg (10/08 0540) Pulse Rate: 72  (10/08 0540)  Labs:  Basename 03/27/12 0530 03/26/12 2018 03/26/12 0550 03/25/12 0519 03/24/12 1415  HGB 11.8* -- 12.1 -- --  HCT 36.6 -- 37.7 37.2 --  PLT 225 -- 249 226 --  APTT -- -- -- -- 25  LABPROT 12.7 -- -- -- 12.3  INR 0.96 -- -- -- 0.92  HEPARINUNFRC 0.61 0.42 0.64 -- --  CREATININE 0.67 -- 0.61 -- 0.74  CKTOTAL -- -- -- -- --  CKMB -- -- -- -- --  TROPONINI -- -- -- -- --    Estimated Creatinine Clearance: 73.5 ml/min (by C-G formula based on Cr of 0.67).  Assessment: 71 y.o. F on heparin + warfarin for bilateral PE confirmed on CTA on  however was found via CTA on 10/6. Today is VTE overlap D#2/5 recommended per CHEST guidelines however best practice is to continue until INR >/= 2 for at least 24 hours. Heparin level this morning is therapeutic (HL 0.61, goal of 0.3-0.7). INR today remains SUBtherapeutic after initiating warfarin yesterday evening (INR 0.96 << 0.92, goal of 2-3). Hgb/Hct/Plt slight drop. Hematoma noted post cath however stable. No other s/sx of bleeding noted at this time. The patient and her daughter were educated on warfarin today.  Goal of Therapy:  INR 2-3 Heparin level 0.3-0.7 units/ml Monitor platelets by anticoagulation protocol: Yes   Plan:  1. Continue  heparin at current drip rate of 1050 units/hr (10.5 ml/hr)  2. Warfarin 5mg  po x 1 dose at 1800 today 3. Will continue to monitor for any signs/symptoms of bleeding and will follow up with PT/INR and heparin level in the a.m.   Georgina Pillion, PharmD, BCPS Clinical Pharmacist Pager: [phone] 03/27/2012 9:24 AM

## 2012-03-28 ENCOUNTER — Encounter (HOSPITAL_COMMUNITY): Payer: Self-pay | Admitting: Cardiology

## 2012-03-28 DIAGNOSIS — I2510 Atherosclerotic heart disease of native coronary artery without angina pectoris: Secondary | ICD-10-CM

## 2012-03-28 DIAGNOSIS — I7090 Unspecified atherosclerosis: Secondary | ICD-10-CM

## 2012-03-28 LAB — CBC
HCT: 38.1 % (ref 36.0–46.0)
MCHC: 31.5 g/dL (ref 30.0–36.0)
RDW: 14.5 % (ref 11.5–15.5)
WBC: 10.6 10*3/uL — ABNORMAL HIGH (ref 4.0–10.5)

## 2012-03-28 LAB — BASIC METABOLIC PANEL
BUN: 7 mg/dL (ref 6–23)
Calcium: 9.6 mg/dL (ref 8.4–10.5)
Creatinine, Ser: 0.66 mg/dL (ref 0.50–1.10)
GFR calc Af Amer: >90 mL/min (ref >90)
GFR calc non Af Amer: 87 mL/min — ABNORMAL LOW (ref >90)

## 2012-03-28 LAB — PROTIME-INR
INR: 1 (ref 0.00–1.49)
Prothrombin Time: 13.1 seconds (ref 11.6–15.2)

## 2012-03-28 LAB — HEPARIN LEVEL (UNFRACTIONATED): Heparin Unfractionated: 0.54 IU/mL (ref 0.30–0.70)

## 2012-03-28 MED ORDER — ENOXAPARIN SODIUM 120 MG/0.8ML ~~LOC~~ SOLN: 120.0000 mg | SUBCUTANEOUS | Status: DC

## 2012-03-28 MED ORDER — ENOXAPARIN (LOVENOX) PATIENT EDUCATION KIT
PACK | Freq: Once | Status: DC
  Filled 2012-03-28: qty 1

## 2012-03-28 MED ORDER — WARFARIN SODIUM 7.5 MG PO TABS
7.5000 mg | ORAL_TABLET | Freq: Once | ORAL | Status: DC
  Filled 2012-03-28: qty 1

## 2012-03-28 MED ORDER — NITROGLYCERIN 0.4 MG SL SUBL: 0.4000 mg | SUBLINGUAL_TABLET | SUBLINGUAL | Status: DC | PRN

## 2012-03-28 MED ORDER — ENOXAPARIN SODIUM 120 MG/0.8ML ~~LOC~~ SOLN
120.0000 mg | SUBCUTANEOUS | Status: DC
  Administered 2012-03-28: 120 mg via SUBCUTANEOUS
  Filled 2012-03-28: qty 0.8

## 2012-03-28 MED ORDER — PRASUGREL HCL 10 MG PO TABS: 10.0000 mg | ORAL_TABLET | Freq: Every day | ORAL | Status: DC

## 2012-03-28 MED ORDER — ENOXAPARIN SODIUM 150 MG/ML ~~LOC~~ SOLN
130.0000 mg | SUBCUTANEOUS | Status: DC
  Filled 2012-03-28: qty 1

## 2012-03-28 MED ORDER — WARFARIN SODIUM 5 MG PO TABS: ORAL_TABLET | ORAL | Status: DC

## 2012-03-28 MED ORDER — ATORVASTATIN CALCIUM 80 MG PO TABS: 80.0000 mg | ORAL_TABLET | Freq: Every day | ORAL | Status: DC

## 2012-03-28 MED ORDER — PANTOPRAZOLE SODIUM 40 MG PO TBEC: 40.0000 mg | DELAYED_RELEASE_TABLET | Freq: Every day | ORAL | Status: DC

## 2012-03-28 NOTE — Plan of Care (Signed)
Problem: Food- and Nutrition-Related Knowledge Deficit (NB-1.1) Goal: Nutrition education Formal process to instruct or train a patient/client in a skill or to impart knowledge to help patients/clients voluntarily manage or modify food choices and eating behavior to maintain or improve health.  Outcome: Completed/Met Date Met:  03/28/12  RD consult for low sodium/Coumadin education. Provided handouts from [employer]. Questions answered. Patient for D/C today.

## 2012-03-28 NOTE — Care Management Note (Signed)
    Page 1 of 2   03/28/2012     11:18:32 AM   CARE MANAGEMENT NOTE 03/28/2012  Patient:  Carrie [name], Carrie [name]   Account Number:  [id number]  Date Initiated:  03/28/2012  Documentation initiated by:  GRAVES-BIGELOW,Erminio Nygard  Subjective/Objective Assessment:   Pt admitted with hx of CAD. S/p cath. Successful PTCA/bare metal stent x 1 mid LAD. Plan for home with effient.     Action/Plan:   MD please write rx for effient 30 day free no refills and then the original rx with refills. Pt will benefit from [facility] and choice is [facility] for services. Please write order. If labs need to be obtained please write in orders. CM made referral.   Anticipated DC Date:  03/29/2012   Anticipated DC Plan:  HOME W HOME HEALTH SERVICES      DC Planning Services  CM consult      [facility] Choice  HOME HEALTH   Choice offered to / List presented to:  C-1 Patient        HH arranged  HH-1 RN  HH-10 DISEASE MANAGEMENT      HH agency  [facility]   Status of service:  Completed, signed off Medicare Important Message given?   (If response is "NO", the following Medicare IM given date fields will be blank) Date Medicare IM given:   Date Additional Medicare IM given:    Discharge Disposition:  HOME W HOME HEALTH SERVICES  Per UR Regulation:  Reviewed for med. necessity/level of care/duration of stay  If discussed at Long Length of Stay Meetings, dates discussed:    Comments:  03-28-12 [address] [place], [place] [phone] CM spoke to  our floor pharmacy to see if pt is a candidate for lovenox for home and pt is. Pharmacy stated that pt can use 120 mg q day for at least 4-5 days. CM did discuss this option with pt and daughter Arline Asp will be able to administer injections. Pt gets medicaitions from [employer] and CM did call to see if meds effient/ lovenox available and they are.  4 syringes at [facility] and they can order more if needed based on INR. Family expressed that they  would like to speak to Dietitian before d/c and a printed out example of meals for diet. CM did speak to RN Irving Burton about the needs the family expressed. CM aslo spoke to [facility] about above information. Family states they have the 30 day free effient card. CM will continue to monitor for disposition needs.

## 2012-03-28 NOTE — Progress Notes (Signed)
PROGRESS NOTE  Subjective:   Carrie [name] is a 71 yo with CAD - s/p bare metal stent to LAD on Oct. 7, 2013, pulmonary embolus.  She is doing well.   Objective:    Vital Signs:   Temp:  [98.3 F (36.8 C)-98.7 F (37.1 C)] 98.6 F (37 C) (10/09 0500) Pulse Rate:  [72-88] 72  (10/09 0500) Resp:  [18-20] 18  (10/09 0500) BP: (120-146)/(73-90) 120/75 mmHg (10/09 0500) SpO2:  [93 %-100 %] 98 % (10/09 0500)  Last BM Date: April 01, 2012   24-hour weight change: Weight change:   Weight trends: Filed Weights   03/24/12 1030 Apr 01, 2012 0540  Weight: 179 lb 14.3 oz (81.6 kg) 188 lb 11.4 oz (85.6 kg)    Intake/Output:  Apr 02, 2023 0701 - 10/09 0700 In: 120 [P.O.:120] Out: -      Physical Exam: BP 120/75  Pulse 72  Temp 98.6 F (37 C) (Oral)  Resp 18  Ht 5\' 7"  (1.702 m)  Wt 188 lb 11.4 oz (85.6 kg)  BMI 29.56 kg/m2  SpO2 98%  General: Vital signs reviewed and noted. Well-developed, well-nourished, in no acute distress; alert, appropriate and cooperative .  Head: Normocephalic, atraumatic.  Eyes: conjunctivae/corneas clear.  EOM's intact.   Throat: normal  Neck: Supple. Normal carotids. No JVD  Lungs:  Clear to auscultation  Heart: Regular rate,  With normal  S1 S2. No murmurs, gallops or rubs  Abdomen:  Soft, non-tender, non-distended with normoactive bowel sounds. No hepatomegaly. No rebound/guarding. No abdominal masses.  Extremities: Distal pedal pulses are 2+ .  No edema.    Neurologic: A&O X3, CN II - XII are grossly intact. Motor strength is 5/5 in the all 4 extremities.  Psych: Responds to questions appropriately with normal affect.    Labs: BMET:  Basename 03/28/12 0555 04/01/12 0530  NA 141 141  K 5.0 4.2  CL 108 108  CO2 26 23  GLUCOSE 110* 112*  BUN 7 8  CREATININE 0.66 0.67  CALCIUM 9.6 9.1  MG -- --  PHOS -- --    Liver function tests: No results found for this basename: AST:2,ALT:2,ALKPHOS:2,BILITOT:2,PROT:2,ALBUMIN:2 in the last 72 hours No results  found for this basename: LIPASE:2,AMYLASE:2 in the last 72 hours  CBC:  Basename 03/28/12 0555 2012-04-01 0530  WBC 10.6* 10.1  NEUTROABS -- --  HGB 12.0 11.8*  HCT 38.1 36.6  MCV 93.2 93.8  PLT 245 225    Cardiac Enzymes: No results found for this basename: CKTOTAL:4,CKMB:4,TROPONINI:4 in the last 72 hours  Coagulation Studies:  Basename 03/28/12 0555 2012-04-01 0530  LABPROT 13.1 12.7  INR 1.00 0.96    Other: No components found with this basename: POCBNP:3 No results found for this basename: DDIMER in the last 72 hours No results found for this basename: HGBA1C in the last 72 hours  Basename 03/25/12 1007  CHOL 157  HDL 71  LDLCALC 64  TRIG 109  CHOLHDL 2.2   No results found for this basename: TSH,T4TOTAL,FREET3,T3FREE,THYROIDAB in the last 72 hours No results found for this basename: VITAMINB12,FOLATE,FERRITIN,TIBC,IRON,RETICCTPCT in the last 72 hours    Tele:  NSR  Medications:    Infusions:    . heparin 1,050 Units/hr (April 01, 2012 1559)    Scheduled Medications:    . albuterol-ipratropium  1 puff Inhalation TID  . atorvastatin  80 mg Oral q1800  . donepezil  5 mg Oral QHS  . ezetimibe  10 mg Oral QHS  . Mometasone Furo-Formoterol Fum  2 puff  Inhalation TID  . montelukast  10 mg Oral QHS  . multivitamin with minerals  1 tablet Oral Daily  . pantoprazole  40 mg Oral Q1200  . prasugrel  10 mg Oral Daily  . raloxifene  60 mg Oral Daily  . ranitidine  300 mg Oral QHS  . venlafaxine XR  75 mg Oral Q breakfast  . warfarin  5 mg Oral ONCE-1800  . Warfarin - Pharmacist Dosing Inpatient   Does not apply q1800  . 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 inr is only 1.0 today   Disposition: continue coumadin load ,  home ~ end of this week when INR is theraputic.  Length of Stay: 5  Vesta Mixer, Montez Hageman., MD, [facility] 03/28/2012, 8:20 AM Office [phone] Pager [phone]

## 2012-03-28 NOTE — Progress Notes (Addendum)
  ANTICOAGULATION CONSULT NOTE - Follow Up Consult  Pharmacy Consult for Heparin and Coumadin Indication: CTA confirmed bilateral PE  Allergies  Allergen Reactions  . Aspirin     REACTION: hives  . Codeine     REACTION: rash/itch  . Metoclopramide Hcl     REACTION: intolerant, restless hands  . Morphine     REACTION: hives  . Penicillins     REACTION: rash/itch  . Sulfonamide Derivatives     REACTION: rash/itch    Patient Measurements: Height: 5\' 7"  (170.2 cm) Weight: 188 lb 11.4 oz (85.6 kg) IBW/kg (Calculated) : 61.6  Heparin Dosing Weight: 79.6 kg  Vital Signs: Temp: 98.6 F (37 C) (10/09 0500) Temp src: Oral (10/09 0500) BP: 120/75 mmHg (10/09 0500) Pulse Rate: 72  (10/09 0500)  Labs:  Basename 03/28/12 0555 03/27/12 0530 03/26/12 2018 03/26/12 0550  HGB 12.0 11.8* -- --  HCT 38.1 36.6 -- 37.7  PLT 245 225 -- 249  APTT -- -- -- --  LABPROT 13.1 12.7 -- --  INR 1.00 0.96 -- --  HEPARINUNFRC 0.54 0.61 0.42 --  CREATININE 0.66 0.67 -- 0.61  CKTOTAL -- -- -- --  CKMB -- -- -- --  TROPONINI -- -- -- --    Estimated Creatinine Clearance: 73.5 ml/min (by C-G formula based on Cr of 0.66).  Assessment: 71 y.o. F on heparin + warfarin for bilateral PE confirmed on CTA  10/6. Today is VTE overlap D#3/5 recommended per CHEST guidelines however best practice is to continue until INR >/= 2 for at least 24 hours. Heparin level this morning is therapeutic (HL 0.54, goal of 0.3-0.7). INR today remains SUBtherapeutic after 2 doses of coumadin 5 mg, INR 1.0 with goal of 2-3. Hgb/Hct/Plt stable. Hematoma noted post cath however stable. No other s/sx of bleeding noted at this time. The patient and her daughter were educated on warfarin 03/27/12. She is on Effient (prasugrel) which increases bleeding risk - she is a non-responder to plavix and allergic to ASA.. Creatinine = 0.66.   Goal of Therapy:  INR 2-3 Heparin level 0.3-0.7 units/ml Monitor platelets by anticoagulation  protocol: Yes   Plan:  1. Continue heparin at current drip rate of 1050 units/hr (10.5 ml/hr)  2. Consider DCing pt home on LMWH 1.5 mg/kg/day = 120 mg sq q24 until INR > = 2 for 24 hours and minimum overlap of 5 days with coumadin 3. consider stopping Evista 2nd increased VIT risk - see sticky note 4. Warfarin 7.5mg  po x 1 dose at 1800 today 5. Will continue to monitor for any signs/symptoms of bleeding and will follow up with PT/INR and heparin level in the a.m.  Herby Abraham, Pharm.D. [phone] 03/28/2012 10:33 AM   Addendum: Will switch IV heparin to lovenox. Pt. Est. crcl ~ 70 ml/min, cbc stable  Plan: Lovenox 120mg  SQ Q 24hrs start today at 1400, D/C IV heparin  Bayard Hugger, PharmD, BCPS  Clinical Pharmacist  Pager: [phone]

## 2012-03-28 NOTE — Progress Notes (Signed)
Pt discharged to home per MD order. Pt and daughter received all discharge instructions and medication information including follow up appointments and prescriptions. Pt received information regarding warfarin therapy diet, lovenox home administration teaching, pulmonary embolism, and heart healthy diet prior to discharge.  Pt alert and oriented at discharge with no complaints of pain.  Pt escorted to private vehicle via wheelchair.  Efraim Kaufmann

## 2012-03-28 NOTE — Discharge Summary (Signed)
Discharge Summary   Patient ID: Carrie [name] MRN: [id number], DOB/AGE: 02-26-1941 71 y.o.  Primary MD: Dwana Melena, MD Primary Cardiologist: Dr. Elease Hashimoto Admit date: 03/23/2012 D/C date:     03/28/2012      Primary Discharge Diagnoses:  1. Coronary Artery Disease (newly diagnosed)  - Cath showed single vessel CAD involving the mid LAD, s/p BMS to mid LAD 03/26/12  - Intolerant to ASA and hyporesponder to Plavix; 10mg  Effient for 1-88mos followed by lifelong 5mg  Effient  - Echo revealed EF 60-65%, no WMAs, nl RV, no significant valvular dysfunction  2. Pulmonary Embolism (newly diagnosed)   - CTA chest showed bilateral lower lobe PE, No DVT on BLE dopplers  - Initiated on lifelong coumadin with Lovenox bridge  - INR 1.0 at dc, f/u coumadin clinic   3. Syncope  - Initially placed holter monitor, but now felt 2/2 #1 so will return monitor to office  4. Hyperlipidemia  - Statin increased, will need lipids/LFTs 8 wks   Secondary Discharge Diagnoses:  . Anxiety   . Chronic asthma   . Persistent headaches   . PONV (postoperative nausea and vomiting) 1983    Allergies Allergies  Allergen Reactions  . Aspirin     REACTION: hives  . Codeine     REACTION: rash/itch  . Metoclopramide Hcl     REACTION: intolerant, restless hands  . Morphine     REACTION: hives  . Penicillins     REACTION: rash/itch  . Sulfonamide Derivatives     REACTION: rash/itch    Diagnostic Studies/Procedures:   03/23/12 - Cardiac Cath Hemodynamics LV pressure: 172/28  Aortic pressure: 175/103  Angiography  Left Main: smooth and normal  Left anterior Descending: proximal vessel is smooth and normal. There is a 90% stenosis in the mid LAD. The remaining LAD is fairly smooth and normal.. The 1st diagonal is a moderate sized vessel with a 30% stenosis in the proximal segment.  Left Circumflex: large . The 1st OM is a large branch and is normal. The 2nd OM is moderate-large in size and is normal  Right  Coronary Artery: large and dominant. There are minor luminal irregularities between 20-30% in the proximal vessel  LV Gram: normal LV function. EF ~ 70%  Conclusions: Single vessel CAD involving the mid LAD . Will set her up for PCI later today  03/26/12 - Cardiac Cath Hemodynamic Findings:  Central aortic pressure: 151/84  Impression:  1. Unstable angina  2. Successful PTCA/bare metal stent x 1 mid LAD  Recommendations: She will need Effient 10 mg po Qdaily for at least one month. Since she is intolerant to ASA and does not respond to Plavix, she will need Effient for lifetime, hopefully at lower dose of 5 mg per day after the first month following stent placement. Will resume heparin drip 4 hours post sheath pull. Will start coumadin tonight.  03/24/12 - Echo Study Conclusions: - Left ventricle: The cavity size was normal. Wall thickness was normal. Systolic function was normal. The estimated ejection fraction was in the range of 60% to 65%. Wall motion was normal; there were no regional wall motion abnormalities. - Aortic valve: Mildly to moderately calcified annulus. Trileaflet; mildly calcified leaflets. - Atrial septum: No defect or patent foramen ovale was identified.  - Right ventricle: The cavity size was normal. Wall thickness was normal. Systolic function was normal.   03/24/12 - Bilateral lower extremity venous duplex  Bilateral lower extremities are negative for deep vein thrombosis.  03/25/2012 - CT ANGIOGRAPHY CHEST Findings: The pulmonary arteries are well opacified with contrast. The study is unequivocally positive for the presence of acute bilateral pulmonary emboli affecting the segmental branches of both lower lobes.  No central emboli are demonstrated.  However, there is mild dilatation of the main pulmonary artery.  The thoracic aorta appears normal.  There are coronary artery calcifications and a small hiatal hernia.  No enlarged mediastinal or hilar lymph nodes are present.   There is no pleural or pericardial effusion.  Multiple pulmonary nodules are again demonstrated bilaterally, not significantly changed from the prior study.  The largest nodules measure 5 mm in the right upper lobe on image 30 and 9 mm in the right lower lobe on image 55.  No enlarging nodules are identified.  The visualized upper abdomen appears unremarkable.  IMPRESSION:  1.  Examination is positive for bilateral lower lobe segmental pulmonary emboli. 2.  Stable bilateral pulmonary nodules from 2010 consistent with a benign etiology. 3.  Coronary artery disease.    History of Present Illness: 71 y.o. female w/ the above medical problems who was evaluated in clinic by Dr. Elease Hashimoto on 03/23/12 for complaints of chest pain, diaphoresis and syncope. Her symptoms were concerning for angina and given her comorbidities it was felt she would not tolerate an exercise or pharmacologic myoview and would therefore need further ischemic evaluation with cardiac cath. Outpatient cath on 10/4 revealed single vessel CAD involving the mid LAD with recommendations for PCI later that day, but due to ASA allergy and the chance of her being a hypo-responder to Plavix the procedure was delayed. She was placed on IV heparin admitted to [facility] Course: She presented to [facility] in stable condition and chest pain free. Echo revealed EF 60-65%, no WMAs, nl RV, no significant valvular dysfunction. Given her history of DVT and complaints of syncope, BLE doppers and DDimer were obtained. Doppler was without evidence of DVT, but DDimer was found to be elevated for which CTA chest was completed showing bilateral lower lobe pulmonary emboli. It was felt she would need life long anticoagulation, but this was held off initially due to need for repeat cath for PCI. She was loaded with Effient and underwent cath with successful PTCA/BMS to mid LAD. She tolerated the procedure well without complications.  Recommendations were made for Effient 10mg  daily for at least one month (likely 3mos if she tolerates) followed by lifelong Effient 5mg  daily given that she is intolerant to ASA, a hyporesponder to Plavix, and will need lifelong anticoagulation for PE. She was initiated on coumadin and Lovenox. On day of discharge INR was 1.0, so she was discharged with a Lovenox bridge. She and her daughter received education regarding coumadin as well as Lovenox injections and case management arranged for home health RN to assist in medication management. She also received nutritional education regarding dietary restrictions with coumadin. She will follow up with the coumadin clinic in two days. She will also discuss risk vs benefit of staying on Evista with her PCP.  She complained of dental pain for which an orthopantogram was obtained revealing no evidence for abscess or other acute findings. She was able to ambulate without chest pain, sob, or syncope. Cath site remained stable. She was wearing a holter monitor prior to admission for investigation into her syncope, but will not need to continue this at discharge as it was felt the syncope was in the setting of PE. She was seen  and evaluated by Dr. Elease Hashimoto who felt she was stable for discharge home with plans for follow up as scheduled below. Statin was increased and will therefore need follow up lipids and LFTs in 8wks.   Discharge Vitals: Blood pressure 129/80, pulse 77, temperature 97.3 F (36.3 C), temperature source Oral, resp. rate 18, height 5\' 7"  (1.702 m), weight 188 lb 11.4 oz (85.6 kg), SpO2 99.00%.  Labs: Component Value Date   WBC 10.6* 03/28/2012   HGB 12.0 03/28/2012   HCT 38.1 03/28/2012   MCV 93.2 03/28/2012   PLT 245 03/28/2012     03/28/2012 05:55  Prothrombin Time 13.1  INR 1.00   Lab 03/28/12 0555  NA 141  K 5.0  CL 108  CO2 26  BUN 7  CREATININE 0.66  CALCIUM 9.6  GLUCOSE 110*   Component Value Date   CHOL 157 03/25/2012   HDL 71  03/25/2012   LDLCALC 64 03/25/2012   TRIG 109 03/25/2012   Component Value Date   DDIMER 2.50* 03/24/2012     03/24/2012 06:44  Platelet Function  P2Y12 286     Discharge Medications     Medication List     As of 03/28/2012  2:09 PM    STOP taking these medications         MILK OF MAGNESIA 400 MG/5ML suspension   Generic drug: magnesium hydroxide      Omeprazole-Sodium Bicarbonate 20-1100 MG Caps   Commonly known as: ZEGERID   Replaced by: pantoprazole 40 MG tablet      ranitidine 150 MG tablet   Commonly known as: ZANTAC      torsemide 20 MG tablet   Commonly known as: DEMADEX      TAKE these medications         albuterol (2.5 MG/3ML) 0.083% nebulizer solution   Commonly known as: PROVENTIL   Take 2.5 mg by nebulization every 6 (six) hours as needed.      albuterol 108 (90 BASE) MCG/ACT inhaler   Commonly known as: PROVENTIL HFA;VENTOLIN HFA   Inhale 2 puffs into the lungs every 6 (six) hours as needed.      albuterol-ipratropium 18-103 MCG/ACT inhaler   Commonly known as: COMBIVENT   Inhale 1 puff into the lungs 3 (three) times daily.      atorvastatin 80 MG tablet   Commonly known as: LIPITOR   Take 1 tablet (80 mg total) by mouth at bedtime.      CULTURELLE PO   Take by mouth daily.      donepezil 5 MG tablet   Commonly known as: ARICEPT   Take 5 mg by mouth at bedtime as needed.      DULERA 200-5 MCG/ACT Aero   Generic drug: Mometasone Furo-Formoterol Fum   Inhale 2 puffs into the lungs 3 (three) times daily.      enoxaparin 120 MG/0.8ML injection   Commonly known as: LOVENOX   Inject 0.8 mLs (120 mg total) into the skin daily.   Start taking on: 03/29/2012      ezetimibe 10 MG tablet   Commonly known as: ZETIA   Take 10 mg by mouth at bedtime.      LORazepam 1 MG tablet   Commonly known as: ATIVAN   Take 1 mg by mouth 3 (three) times daily as needed. 0.5 mg in the morning and 1 mg at bedtime;  May also take 1 mg at lunchtime if needed       Magnesium 500  MG Caps   Take 500 mg by mouth daily.      montelukast 10 MG tablet   Commonly known as: SINGULAIR   Take 10 mg by mouth at bedtime.      multivitamin with minerals Tabs   Take 1 tablet by mouth daily.      nitroGLYCERIN 0.4 MG SL tablet   Commonly known as: NITROSTAT   Place 1 tablet (0.4 mg total) under the tongue every 5 (five) minutes as needed for chest pain (up to 3 doses).      pantoprazole 40 MG tablet   Commonly known as: PROTONIX   Take 1 tablet (40 mg total) by mouth daily.      prasugrel 10 MG Tabs   Commonly known as: EFFIENT   Take 1 tablet (10 mg total) by mouth daily.      prasugrel 10 MG Tabs   Commonly known as: EFFIENT   Take 1 tablet (10 mg total) by mouth daily.   Start taking on: 04/25/2012      raloxifene 60 MG tablet   Commonly known as: EVISTA   Take 60 mg by mouth daily.      venlafaxine XR 75 MG 24 hr capsule   Commonly known as: EFFEXOR-XR   1 tablets at bedtime      warfarin 5 MG tablet   Commonly known as: COUMADIN   Take 7.5mg  (1 1/2 tabs) on 10/9 and 10/10. Have your INR checked 10/11 for guidance on further dosing          Disposition   Discharge Orders    Future Appointments: Provider: Department: Dept Phone: Center:   03/30/2012 9:00 AM [facility] [phone] None   04/10/2012 12:00 PM Vesta Mixer, MD [facility] [phone] [facility]     Future Orders Please Complete By Expires   Amb Referral to Cardiac Rehabilitation      Diet - low sodium heart healthy      Increase activity slowly      Discharge instructions      Comments:   * KEEP GROIN SITE CLEAN AND DRY. Call the office for any signs of bleedings, pus, swelling, increased pain, or any other concerns. * NO HEAVY LIFTING (>10lbs) OR SEXUAL ACTIVITY X 7 DAYS. * NO DRIVING X 3-5 DAYS. * NO SOAKING BATHS, HOT TUBS, POOLS, ETC., X 7 DAYS.  * Please take all medications as prescribed and bring them  with you to your office visits * Take 7.5mg  Coumadin on 10/9 and 10/10 then have your INR checked at the [facility] on 10/11 for guidance on further dosing. * You will receive an injection of Lovenox before going home. Continue Lovenox injections once a day starting tomorrow (10/10) until your INR is therapeutic and you are told to stop. * Your Ranitidine was stopped and Prilosec changed to Protonix due to its interactions with Effient. * Please monitor for and notify the office for any signs of bleeding. * Your cholesterol medication (Lipitor) was increased and you will therefore need follow up blood work to monitor your cholesterol levels and liver function in ~8 weeks. * Please talk to your primary care provider about Evista as it can increase your risk of blood clots.     Follow-up Information    Follow up with [facility]. On 03/30/2012. (INR check @ 9:00)    Contact information:   [address] [place] [address] [phone]  Follow up with Elyn Aquas., MD. On 04/10/2012. (12:00)    Contact information:   [employer] [address] [place] [address] [phone]       Schedule an appointment as soon as possible for a visit with [facility], MD.   Contact information:   [address] [place] [address] [phone]           Outstanding Labs/Studies:  1. INR 2. Follow up lipids/LFTs 8wks  Duration of Discharge Encounter: Greater than 30 minutes including physician and PA time.  Signed, HOPE, JESSICA PA-C 03/28/2012, 2:09 PM  Attending note: see my note from earlier today.  We have decided to discharge her early using Lovenox.  I have reexamined her cath sites and they are all well healed.  She has slight bruising on her left wrist but it appears stable.  Continue Effient for her stent - she is intolerant to ASA.    We will discharge her with Lovenox bridge.  Continue coumadin.  Will have  her go to coumadin clinic this Friday.

## 2012-03-28 NOTE — Progress Notes (Signed)
CARDIAC REHAB PHASE I   PRE:  Rate/Rhythm: 76SR  BP:  Supine:   Sitting: 108/70  Standing:    SaO2: 98%RA  MODE:  Ambulation: 300 ft   POST:  Rate/Rhythem: 108ST  BP:  Supine:   Sitting: 142/80  Standing:    SaO2: 97%RA 1034-1135 Pt walked 300 ft with boot and rolling walker on RA and asst x 1. Tolerated well without CP. To recliner after walk. Education completed with daughter and pt. Pt with many diet questions. Encouraged heart healthy eating with coumadin restrictions. Modified ex given as pt has orthopedic issue. Permission given to refer to [facility]. Pt and daughter very receptive to ed.   Duanne Limerick

## 2012-03-30 ENCOUNTER — Ambulatory Visit (INDEPENDENT_AMBULATORY_CARE_PROVIDER_SITE_OTHER): Payer: Medicare Other | Admitting: *Deleted

## 2012-03-30 DIAGNOSIS — I2699 Other pulmonary embolism without acute cor pulmonale: Secondary | ICD-10-CM

## 2012-03-30 DIAGNOSIS — Z7901 Long term (current) use of anticoagulants: Secondary | ICD-10-CM

## 2012-04-02 ENCOUNTER — Ambulatory Visit (INDEPENDENT_AMBULATORY_CARE_PROVIDER_SITE_OTHER): Payer: Medicare Other | Admitting: *Deleted

## 2012-04-02 DIAGNOSIS — Z7901 Long term (current) use of anticoagulants: Secondary | ICD-10-CM

## 2012-04-02 DIAGNOSIS — I2699 Other pulmonary embolism without acute cor pulmonale: Secondary | ICD-10-CM

## 2012-04-05 ENCOUNTER — Ambulatory Visit (INDEPENDENT_AMBULATORY_CARE_PROVIDER_SITE_OTHER): Payer: Medicare Other | Admitting: *Deleted

## 2012-04-05 DIAGNOSIS — Z7901 Long term (current) use of anticoagulants: Secondary | ICD-10-CM

## 2012-04-05 DIAGNOSIS — I2699 Other pulmonary embolism without acute cor pulmonale: Secondary | ICD-10-CM

## 2012-04-05 LAB — POCT INR: INR: 2

## 2012-04-09 ENCOUNTER — Ambulatory Visit (INDEPENDENT_AMBULATORY_CARE_PROVIDER_SITE_OTHER): Payer: Medicare Other | Admitting: *Deleted

## 2012-04-09 DIAGNOSIS — I2699 Other pulmonary embolism without acute cor pulmonale: Secondary | ICD-10-CM

## 2012-04-09 DIAGNOSIS — Z7901 Long term (current) use of anticoagulants: Secondary | ICD-10-CM

## 2012-04-10 ENCOUNTER — Encounter: Payer: Self-pay | Admitting: Cardiovascular Disease

## 2012-04-10 ENCOUNTER — Ambulatory Visit (INDEPENDENT_AMBULATORY_CARE_PROVIDER_SITE_OTHER): Payer: Medicare Other | Admitting: Cardiovascular Disease

## 2012-04-10 VITALS — BP 134/76 | HR 87 | Ht 67.0 in | Wt 181.0 lb

## 2012-04-10 DIAGNOSIS — I2699 Other pulmonary embolism without acute cor pulmonale: Secondary | ICD-10-CM

## 2012-04-10 DIAGNOSIS — I2510 Atherosclerotic heart disease of native coronary artery without angina pectoris: Secondary | ICD-10-CM

## 2012-04-10 NOTE — Patient Instructions (Addendum)
Your physician recommends that you schedule a follow-up appointment in: 3 MONTHS  Your physician recommends that you continue on your current medications as directed. Please refer to the Current Medication list given to you today.   

## 2012-04-10 NOTE — Assessment & Plan Note (Signed)
Carmalita is doing well. She's not had any episodes of angina. She did have one episode of vague chest pain but I think this was due to anxiety. She has a stent in the mid LAD. She has only mild to moderate disease elsewhere.  She is allergic to aspirin. We'll continue with 3 months of Effient 10 mg a day and then anticipate decreasing the dose to 5 mg a day.

## 2012-04-10 NOTE — Progress Notes (Signed)
Carrie [name] Date of Birth  May 14, 1941       [facility]    [place] [address]  [address] [place], [place]  [address]   [place], [place]  [address] [phone]     [phone]   Fax  [phone]    Fax [phone]  Problem List: 1. Syncope -likely due pulmonary emboli 2. Hyperlipidemia 3. severe asthma 4. CAD - s/p stenting of LAD   History of Present Illness:  Carrie [name] is a 71 yo who I have seen in the past.  She has been having problems with syncope.  She has a strong family  hx of CAD.  She has been having chest pressure - associated with severe dyspnea.  This occurs in the middle of the night.  It occasionally eases off with Tums or Pepsi.  The pain radiates to her left breast and to her left shoulder blade / left arm.  She has profuse diaphoresis.   These episodes of diaphoresis are followed by syncope.    These episodes of syncope have occurred as much as several times a week.    She was admitted to the hospital recently with an episode of syncope and chest pain.  She was found to have bilateral pulmonary embolus and significant CAD. She had a bare metal stent to the LAD, was treated with Effient.  She was treated with coumadin, and shoulders FE for 3 months. The plan is to reduce the dose of Effient to 5 mg a day after the initial 3 months.  She has had some episodes of chest pain since he left the hospital. Her daughter thinks that there may be due to anxiety. She also has significant asthma and is not able to ambulate mostly because of her asthma. She has a tight asthmatic cough. The cough seems to have worsened over the past couple of weeks.  She's developed some hypoxia walking back from the front of the office today. Her O2 saturation was 92%.  She denies bleeding.  Current Outpatient Prescriptions on File Prior to Visit  Medication Sig Dispense Refill  . albuterol (PROVENTIL HFA;VENTOLIN HFA) 108 (90 BASE) MCG/ACT  inhaler Inhale 2 puffs into the lungs every 6 (six) hours as needed.      [name] albuterol (PROVENTIL) (2.5 MG/3ML) 0.083% nebulizer solution Take 2.5 mg by nebulization every 6 (six) hours as needed.      [name] albuterol-ipratropium (COMBIVENT) 18-103 MCG/ACT inhaler Inhale 1 puff into the lungs 3 (three) times daily.      [name] atorvastatin (LIPITOR) 80 MG tablet Take 1 tablet (80 mg total) by mouth at bedtime.  30 tablet  6  . donepezil (ARICEPT) 5 MG tablet Take 5 mg by mouth at bedtime as needed.      . ezetimibe (ZETIA) 10 MG tablet Take 10 mg by mouth at bedtime.      . Lactobacillus Rhamnosus, GG, (CULTURELLE PO) Take by mouth daily.      [name] LORazepam (ATIVAN) 1 MG tablet Take 1 mg by mouth 3 (three) times daily as needed. 0.5 mg in the morning and 1 mg at bedtime;  May also take 1 mg at lunchtime if needed      . Magnesium 500 MG CAPS Take 500 mg by mouth daily.      . Mometasone Furo-Formoterol Fum (DULERA) 200-5 MCG/ACT AERO Inhale 2 puffs into the lungs 3 (three) times daily.      . montelukast (SINGULAIR)  10 MG tablet Take 10 mg by mouth at bedtime.      . Multiple Vitamin (MULITIVITAMIN WITH MINERALS) TABS Take 1 tablet by mouth daily.      . nitroGLYCERIN (NITROSTAT) 0.4 MG SL tablet Place 1 tablet (0.4 mg total) under the tongue every 5 (five) minutes as needed for chest pain (up to 3 doses).  25 tablet  3  . pantoprazole (PROTONIX) 40 MG tablet Take 1 tablet (40 mg total) by mouth daily.  30 tablet  6  . prasugrel (EFFIENT) 10 MG TABS Take 1 tablet (10 mg total) by mouth daily.  30 tablet  0  . prasugrel (EFFIENT) 10 MG TABS Take 1 tablet (10 mg total) by mouth daily.  30 tablet  2  . raloxifene (EVISTA) 60 MG tablet Take 60 mg by mouth daily.        [name] venlafaxine (EFFEXOR-XR) 75 MG 24 hr capsule 1 tablets at bedtime      . warfarin (COUMADIN) 5 MG tablet Take 7.5mg  (1 1/2 tabs) on 10/9 and 10/10. Have your INR checked 10/11 for guidance on further dosing  60 tablet  6    Allergies  Allergen  Reactions  . Aspirin     REACTION: hives  . Codeine     REACTION: rash/itch  . Metoclopramide Hcl     REACTION: intolerant, restless hands  . Morphine     REACTION: hives  . Penicillins     REACTION: rash/itch  . Sulfonamide Derivatives     REACTION: rash/itch    Past Medical History  Diagnosis Date  . Hyperlipidemia   . Anxiety   . Chronic asthma   . Persistent headaches   . PONV (postoperative nausea and vomiting) 1983  . Coronary artery disease      single vessel CAD involving the mid LAD, s/p BMS to mid LAD 03/26/12, nl LV systolic fxn  . Pulmonary embolism     initiated on lifelong coumadin 03/2012    Past Surgical History  Procedure Date  . Appendectomy   . Tubal ligation   . Cystic surgery on breast     bilateral  . Left ankle surgery   . Shoulder surgery 06/2010    bone spurs    History  Smoking status  . Never Smoker   Smokeless tobacco  . Not on file  Comment: exposed to 2nd hand smoke    History  Alcohol Use No    Family History  Problem Relation Age of Onset  . Heart attack Mother   . Heart attack Father   . Cirrhosis Sister   . Diabetes Sister   . Heart attack Sister   . Heart disease Brother   . Diabetes Brother   . Breast cancer Mother   . Ovarian cancer Sister   . Ovarian cancer Sister   . Colon polyps Mother     Reviw of Systems:  Reviewed in the HPI.  All other systems are negative.  Physical Exam: Blood pressure 134/76, pulse 87, height 5\' 7"  (1.702 m), weight 181 lb (82.101 kg), SpO2 92.00%. General: Well developed, well nourished, in no acute distress.  Head: Normocephalic, atraumatic, sclera non-icteric, mucus membranes are moist,   Neck: Supple. Carotids are 2 + without bruits. No JVD  Lungs: She has a few wheezes with expiration. She states that these wheezes are fairly common for her.  Heart: regular rate.  normal  S1 S2. No murmurs, gallops or rubs.  Abdomen: Soft, non-tender, non-distended with normal  bowel  sounds. No hepatomegaly. No rebound/guarding. No masses.  Msk:  Strength and tone are normal  Extremities: No clubbing or cyanosis. No edema.  Distal pedal pulses are 2+ and equal bilaterally.  Neuro: Alert and oriented X 3. Moves all extremities spontaneously.  Psych:  Responds to questions appropriately with a normal affect.  ECG: Oct. 1, 2013 - NSR at 73,  Low voltage QRS.  O2 sat after walking was 92%.  Assessment / Plan:

## 2012-04-10 NOTE — Assessment & Plan Note (Signed)
She will continue with life long coumadin.  She has had recurrent pulmonary emboli.  In an effort to minimize bleeding risk, we will be lowering the dose of the Effient to 5 mg a day after 3 months.

## 2012-04-13 ENCOUNTER — Ambulatory Visit (INDEPENDENT_AMBULATORY_CARE_PROVIDER_SITE_OTHER): Payer: Medicare Other | Admitting: *Deleted

## 2012-04-13 DIAGNOSIS — Z7901 Long term (current) use of anticoagulants: Secondary | ICD-10-CM

## 2012-04-13 DIAGNOSIS — I2699 Other pulmonary embolism without acute cor pulmonale: Secondary | ICD-10-CM

## 2012-04-17 ENCOUNTER — Encounter (HOSPITAL_COMMUNITY): Payer: Self-pay

## 2012-04-17 ENCOUNTER — Encounter (HOSPITAL_COMMUNITY)
Admission: RE | Admit: 2012-04-17 | Discharge: 2012-04-17 | Disposition: A | Discharge status: Home / Self Care | Payer: Medicare Other | Source: Ambulatory Visit | Attending: Cardiovascular Disease | Admitting: Cardiovascular Disease

## 2012-04-17 NOTE — Patient Instructions (Signed)
Pt has finished orientation and is scheduled to start CR on 04/23/12 at 9:30. Pt has been instructed to arrive to class 15 minutes early for scheduled class. Pt has been instructed to wear comfortable clothing and shoes with rubber soles. Pt has been told to take their medications 1 hour prior to coming to class.  If the patient is not going to attend class, he/she has been instructed to call.

## 2012-04-17 NOTE — Progress Notes (Addendum)
Dr. Laqueta Carina referred to Cardiac Rehab due to recent stent placement V45.82. During orientation advised patient on arrival and appointment times what to wear, what to do before, during and after exercise. Reviewed attendance and class policy. Talked about inclement weather and class consultation policy. Pt is scheduled to start Cardiac Rehab on 04/23/12 at 9:30. Pt was advised to come to class 5 minutes before class starts. He was also given instructions on meeting with the dietician and attending the Family Structure classes. Pt is eager to get started.   Patient did the walk test on 04/17/12.

## 2012-04-23 ENCOUNTER — Encounter (HOSPITAL_COMMUNITY)
Admission: RE | Admit: 2012-04-23 | Discharge: 2012-04-23 | Disposition: A | Discharge status: Home / Self Care | Payer: Medicare Other | Source: Ambulatory Visit | Attending: Cardiovascular Disease | Admitting: Cardiovascular Disease

## 2012-04-23 DIAGNOSIS — I2510 Atherosclerotic heart disease of native coronary artery without angina pectoris: Secondary | ICD-10-CM | POA: Insufficient documentation

## 2012-04-23 DIAGNOSIS — Z5189 Encounter for other specified aftercare: Secondary | ICD-10-CM | POA: Insufficient documentation

## 2012-04-23 DIAGNOSIS — Z9889 Other specified postprocedural states: Secondary | ICD-10-CM | POA: Insufficient documentation

## 2012-04-25 ENCOUNTER — Encounter (HOSPITAL_COMMUNITY)
Admission: RE | Admit: 2012-04-25 | Discharge: 2012-04-25 | Disposition: A | Discharge status: Home / Self Care | Payer: Medicare Other | Source: Ambulatory Visit | Attending: Cardiovascular Disease | Admitting: Cardiovascular Disease

## 2012-04-26 ENCOUNTER — Telehealth: Payer: Self-pay | Admitting: *Deleted

## 2012-04-26 ENCOUNTER — Ambulatory Visit (INDEPENDENT_AMBULATORY_CARE_PROVIDER_SITE_OTHER): Payer: Medicare Other | Admitting: *Deleted

## 2012-04-26 DIAGNOSIS — Z7901 Long term (current) use of anticoagulants: Secondary | ICD-10-CM

## 2012-04-26 DIAGNOSIS — I2699 Other pulmonary embolism without acute cor pulmonale: Secondary | ICD-10-CM

## 2012-04-26 MED ORDER — WARFARIN SODIUM 5 MG PO TABS: ORAL_TABLET | ORAL | Status: DC

## 2012-04-26 NOTE — Telephone Encounter (Signed)
Pt told life watch results were normal - no significant arrhthymias, pt verbalized understanding.

## 2012-04-27 ENCOUNTER — Encounter (HOSPITAL_COMMUNITY)
Admission: RE | Admit: 2012-04-27 | Discharge: 2012-04-27 | Disposition: A | Discharge status: Home / Self Care | Payer: Medicare Other | Source: Ambulatory Visit | Attending: Cardiovascular Disease | Admitting: Cardiovascular Disease

## 2012-04-30 ENCOUNTER — Encounter (HOSPITAL_COMMUNITY)
Admission: RE | Admit: 2012-04-30 | Discharge: 2012-04-30 | Disposition: A | Discharge status: Home / Self Care | Payer: Medicare Other | Source: Ambulatory Visit | Attending: Cardiovascular Disease | Admitting: Cardiovascular Disease

## 2012-05-01 ENCOUNTER — Telehealth: Payer: Self-pay | Admitting: *Deleted

## 2012-05-01 NOTE — Telephone Encounter (Signed)
Patient's daughter states that patient is having bruising all over her body.  States that she thinks patient is "bleeding out". / tg

## 2012-05-01 NOTE — Telephone Encounter (Signed)
Has multiple bruises on legs.  Has appt for INR check tomorrow.  No there signs of bleeding.  Told daughter to hold pts coumadin tonight and recheck at appt tomorrow.  Instructed to go to ED for S/S of bleeding.  Daughter verbalized understanding.

## 2012-05-02 ENCOUNTER — Ambulatory Visit (INDEPENDENT_AMBULATORY_CARE_PROVIDER_SITE_OTHER): Payer: Medicare Other | Admitting: *Deleted

## 2012-05-02 ENCOUNTER — Encounter (HOSPITAL_COMMUNITY)
Admission: RE | Admit: 2012-05-02 | Discharge: 2012-05-02 | Disposition: A | Discharge status: Home / Self Care | Payer: Medicare Other | Source: Ambulatory Visit | Attending: Cardiovascular Disease | Admitting: Cardiovascular Disease

## 2012-05-02 DIAGNOSIS — I2699 Other pulmonary embolism without acute cor pulmonale: Secondary | ICD-10-CM

## 2012-05-02 DIAGNOSIS — Z7901 Long term (current) use of anticoagulants: Secondary | ICD-10-CM

## 2012-05-02 LAB — POCT INR: INR: 1.7

## 2012-05-04 ENCOUNTER — Encounter (HOSPITAL_COMMUNITY)
Admission: RE | Admit: 2012-05-04 | Discharge: 2012-05-04 | Disposition: A | Discharge status: Home / Self Care | Payer: Medicare Other | Source: Ambulatory Visit | Attending: Cardiovascular Disease | Admitting: Cardiovascular Disease

## 2012-05-07 ENCOUNTER — Encounter (HOSPITAL_COMMUNITY)
Admission: RE | Admit: 2012-05-07 | Discharge: 2012-05-07 | Disposition: A | Discharge status: Home / Self Care | Payer: Medicare Other | Source: Ambulatory Visit | Attending: Cardiovascular Disease | Admitting: Cardiovascular Disease

## 2012-05-09 ENCOUNTER — Encounter (HOSPITAL_COMMUNITY)
Admission: RE | Admit: 2012-05-09 | Discharge: 2012-05-09 | Disposition: A | Discharge status: Home / Self Care | Payer: Medicare Other | Source: Ambulatory Visit | Attending: Cardiovascular Disease | Admitting: Cardiovascular Disease

## 2012-05-09 ENCOUNTER — Encounter (HOSPITAL_COMMUNITY): Payer: Self-pay | Admitting: *Deleted

## 2012-05-09 ENCOUNTER — Emergency Department (HOSPITAL_COMMUNITY)
Admission: EM | Admit: 2012-05-09 | Discharge: 2012-05-09 | Disposition: A | Discharge status: Home / Self Care | Payer: Medicare Other | Attending: Emergency Medicine | Admitting: Emergency Medicine

## 2012-05-09 ENCOUNTER — Encounter (INDEPENDENT_AMBULATORY_CARE_PROVIDER_SITE_OTHER): Payer: Medicare Other | Admitting: *Deleted

## 2012-05-09 DIAGNOSIS — IMO0002 Reserved for concepts with insufficient information to code with codable children: Secondary | ICD-10-CM | POA: Insufficient documentation

## 2012-05-09 DIAGNOSIS — R51 Headache: Secondary | ICD-10-CM | POA: Insufficient documentation

## 2012-05-09 DIAGNOSIS — J449 Chronic obstructive pulmonary disease, unspecified: Secondary | ICD-10-CM | POA: Insufficient documentation

## 2012-05-09 DIAGNOSIS — Z7901 Long term (current) use of anticoagulants: Secondary | ICD-10-CM | POA: Insufficient documentation

## 2012-05-09 DIAGNOSIS — I2699 Other pulmonary embolism without acute cor pulmonale: Secondary | ICD-10-CM

## 2012-05-09 DIAGNOSIS — Y929 Unspecified place or not applicable: Secondary | ICD-10-CM | POA: Insufficient documentation

## 2012-05-09 DIAGNOSIS — I2510 Atherosclerotic heart disease of native coronary artery without angina pectoris: Secondary | ICD-10-CM | POA: Insufficient documentation

## 2012-05-09 DIAGNOSIS — Z9889 Other specified postprocedural states: Secondary | ICD-10-CM | POA: Insufficient documentation

## 2012-05-09 DIAGNOSIS — Y9389 Activity, other specified: Secondary | ICD-10-CM | POA: Insufficient documentation

## 2012-05-09 DIAGNOSIS — Z79899 Other long term (current) drug therapy: Secondary | ICD-10-CM | POA: Insufficient documentation

## 2012-05-09 DIAGNOSIS — Z86711 Personal history of pulmonary embolism: Secondary | ICD-10-CM | POA: Insufficient documentation

## 2012-05-09 DIAGNOSIS — T148XXA Other injury of unspecified body region, initial encounter: Secondary | ICD-10-CM

## 2012-05-09 DIAGNOSIS — J4489 Other specified chronic obstructive pulmonary disease: Secondary | ICD-10-CM | POA: Insufficient documentation

## 2012-05-09 DIAGNOSIS — E785 Hyperlipidemia, unspecified: Secondary | ICD-10-CM | POA: Insufficient documentation

## 2012-05-09 MED ORDER — "THROMBI-PAD 3""X3"" EX PADS"
MEDICATED_PAD | CUTANEOUS | Status: AC
  Administered 2012-05-09: 17:00:00
  Filled 2012-05-09: qty 1

## 2012-05-09 MED ORDER — BACITRACIN ZINC 500 UNIT/GM EX OINT
TOPICAL_OINTMENT | CUTANEOUS | Status: AC
  Administered 2012-05-09: 17:00:00
  Filled 2012-05-09: qty 0.9

## 2012-05-09 NOTE — ED Notes (Signed)
Pt presents with bleeding to lower right leg. Pt states she hit leg on unknown object this morning and has been bleeding from area since. Pt currently take coumadin. Leg is bandaged with pressure.

## 2012-05-09 NOTE — ED Provider Notes (Signed)
History   This chart was scribed for Toy Baker, MD by Sofie Rower, ED Scribe. The patient was seen in room APA07/APA07 and the patient's care was started at 4:03PM.     CSN: [id number]  Arrival date & time 05/09/12  1516   First MD Initiated Contact with Patient 05/09/12 1603      Chief Complaint  Patient presents with  . Coagulation Disorder    (Consider location/radiation/quality/duration/timing/severity/associated sxs/prior treatment) The history is provided by the patient. No language interpreter was used.    Carrie [name] is a 71 y.o. female , with a hx of pulmonary embolism, who presents to the Emergency Department complaining of sudden, progressively worsening, leg injury, located at the right lower extremity, onset today (05/09/12).  Associated symptoms include bleeding from the right lower extremity. The pt reports she impacted upon an unknown object, hitting her right lower leg after leaving cardiac rehab at noon today (05/09/12). The pt informs she had her coumadin checked earlier this afternoon, which measured 2.2 (INR measured 2.2 today). The pt has applied a pressure bandage upon the right lower extremity which provides moderate relief of the bleeding.  The pt does not smoke or drink alcohol.   PCP is Dr. Margo Aye.    Past Medical History  Diagnosis Date  . Hyperlipidemia   . Anxiety   . Chronic asthma   . Persistent headaches   . PONV (postoperative nausea and vomiting) 1983  . Coronary artery disease      single vessel CAD involving the mid LAD, s/p BMS to mid LAD 03/26/12, nl LV systolic fxn  . Pulmonary embolism     initiated on lifelong coumadin 03/2012    Past Surgical History  Procedure Date  . Appendectomy   . Tubal ligation   . Cystic surgery on breast     bilateral  . Left ankle surgery   . Shoulder surgery 06/2010    bone spurs  . Coronary stent placement 03/26/2012  . Coronary angioplasty with stent placement   . Breast surgery     Family  History  Problem Relation Age of Onset  . Heart attack Mother   . Heart attack Father   . Cirrhosis Sister   . Diabetes Sister   . Heart attack Sister   . Heart disease Brother   . Diabetes Brother   . Breast cancer Mother   . Ovarian cancer Sister   . Ovarian cancer Sister   . Colon polyps Mother     History  Substance Use Topics  . Smoking status: Never Smoker   . Smokeless tobacco: Not on file     Comment: exposed to 2nd hand smoke  . Alcohol Use: No    OB History    Grav Para Term Preterm Abortions TAB SAB Ect Mult Living                  Review of Systems  All other systems reviewed and are negative.    Allergies  Aspirin; Codeine; Metoclopramide hcl; Morphine; Penicillins; and Sulfonamide derivatives  Home Medications   Current Outpatient Rx  Name  Route  Sig  Dispense  Refill  . ALBUTEROL SULFATE HFA 108 (90 BASE) MCG/ACT IN AERS   Inhalation   Inhale 2 puffs into the lungs every 6 (six) hours as needed.         . ALBUTEROL SULFATE (2.5 MG/3ML) 0.083% IN NEBU   Nebulization   Take 2.5 mg by nebulization every 6 (six)  hours as needed.         Maximino Greenland 18-103 MCG/ACT IN AERO   Inhalation   Inhale 1 puff into the lungs 3 (three) times daily.         . ATORVASTATIN CALCIUM 80 MG PO TABS   Oral   Take 1 tablet (80 mg total) by mouth at bedtime.   30 tablet   6   . DONEPEZIL HCL 5 MG PO TABS   Oral   Take 5 mg by mouth at bedtime as needed.         [name] EZETIMIBE 10 MG PO TABS   Oral   Take 10 mg by mouth at bedtime.         . CULTURELLE PO   Oral   Take by mouth daily.         [name] LORAZEPAM 1 MG PO TABS   Oral   Take 1 mg by mouth 3 (three) times daily as needed. 0.5 mg in the morning and 1 mg at bedtime;  May also take 1 mg at lunchtime if needed         . MAGNESIUM 500 MG PO CAPS   Oral   Take 500 mg by mouth daily.         . MOMETASONE FURO-FORMOTEROL FUM 200-5 MCG/ACT IN AERO   Inhalation   Inhale 2 puffs  into the lungs 3 (three) times daily.         [name] MONTELUKAST SODIUM 10 MG PO TABS   Oral   Take 10 mg by mouth at bedtime.         . ADULT MULTIVITAMIN W/MINERALS CH   Oral   Take 1 tablet by mouth daily.         [name] NITROGLYCERIN 0.4 MG SL SUBL   Sublingual   Place 1 tablet (0.4 mg total) under the tongue every 5 (five) minutes as needed for chest pain (up to 3 doses).   25 tablet   3   . PANTOPRAZOLE SODIUM 40 MG PO TBEC   Oral   Take 1 tablet (40 mg total) by mouth daily.   30 tablet   6   . PRASUGREL HCL 10 MG PO TABS   Oral   Take 1 tablet (10 mg total) by mouth daily.   30 tablet   0   . PRASUGREL HCL 10 MG PO TABS   Oral   Take 1 tablet (10 mg total) by mouth daily.   30 tablet   2   . RALOXIFENE HCL 60 MG PO TABS   Oral   Take 60 mg by mouth daily.           . VENLAFAXINE HCL ER 75 MG PO CP24      1 tablets at bedtime         . WARFARIN SODIUM 5 MG PO TABS      Increase coumadin to 7.5mg  daily except 10mg  on M,W,F   60 tablet   3     BP 147/62  Pulse 86  Temp 98.5 F (36.9 C) (Oral)  Resp 20  Ht 5' 6.5" (1.689 m)  Wt 181 lb (82.101 kg)  BMI 28.78 kg/m2  SpO2 99%  Physical Exam  Nursing note and vitals reviewed. Constitutional: She is oriented to person, place, and time. She appears well-developed and well-nourished.  Non-toxic appearance. No distress.  HENT:  Head: Normocephalic and atraumatic.  Eyes: Conjunctivae normal, EOM and lids are normal. Pupils  are equal, round, and reactive to light.  Neck: Normal range of motion. Neck supple. No tracheal deviation present. No mass present.  Cardiovascular: Normal rate, regular rhythm and normal heart sounds.  Exam reveals no gallop.   No murmur heard. Pulmonary/Chest: Effort normal and breath sounds normal. No stridor. No respiratory distress. She has no decreased breath sounds. She has no wheezes. She has no rhonchi. She has no rales.  Abdominal: Soft. Normal appearance and bowel sounds  are normal. She exhibits no distension. There is no tenderness. There is no rebound and no CVA tenderness.  Musculoskeletal: Normal range of motion. She exhibits no edema and no tenderness.       Dime sized abrasion to the mid anterior tibia with control of bleeding.   Neurological: She is alert and oriented to person, place, and time. She has normal strength. No cranial nerve deficit or sensory deficit. GCS eye subscore is 4. GCS verbal subscore is 5. GCS motor subscore is 6.  Skin: Skin is warm and dry. No abrasion and no rash noted.  Psychiatric: She has a normal mood and affect. Her speech is normal and behavior is normal.    ED Course  Procedures (including critical care time)  DIAGNOSTIC STUDIES: Oxygen Saturation is 99% on room air, normal by my interpretation.    COORDINATION OF CARE:   4:16 PM- Treatment plan concerning application of bandage discussed with patient. Pt agrees with treatment.     Labs Reviewed - No data to display No results found.   No diagnosis found.    MDM  Patient's wound without bleeding at this time. Thrombi pad applied along with antibiotic ointment. And she will followup with her Dr.      I personally performed the services described in this documentation, which was scribed in my presence. The recorded information has been reviewed and considered.   Toy Baker, MD 05/09/12 [phone]

## 2012-05-09 NOTE — ED Notes (Signed)
Bleeding from rt lower leg, ?struck her leg andhas been bleeding since 12 n

## 2012-05-11 ENCOUNTER — Encounter (HOSPITAL_COMMUNITY): Payer: Medicare Other

## 2012-05-14 ENCOUNTER — Encounter (HOSPITAL_COMMUNITY): Payer: Medicare Other

## 2012-05-16 ENCOUNTER — Encounter (HOSPITAL_COMMUNITY): Payer: Medicare Other

## 2012-05-18 ENCOUNTER — Encounter (HOSPITAL_COMMUNITY): Payer: Medicare Other

## 2012-05-21 ENCOUNTER — Encounter (HOSPITAL_COMMUNITY)
Admission: RE | Admit: 2012-05-21 | Discharge: 2012-05-21 | Disposition: A | Discharge status: Home / Self Care | Payer: Medicare Other | Source: Ambulatory Visit | Attending: Cardiovascular Disease | Admitting: Cardiovascular Disease

## 2012-05-21 DIAGNOSIS — Z5189 Encounter for other specified aftercare: Secondary | ICD-10-CM | POA: Insufficient documentation

## 2012-05-21 DIAGNOSIS — I2510 Atherosclerotic heart disease of native coronary artery without angina pectoris: Secondary | ICD-10-CM | POA: Insufficient documentation

## 2012-05-23 ENCOUNTER — Emergency Department (HOSPITAL_COMMUNITY)
Admission: EM | Admit: 2012-05-23 | Discharge: 2012-05-23 | Disposition: A | Discharge status: Home / Self Care | Payer: Medicare Other | Attending: Emergency Medicine | Admitting: Emergency Medicine

## 2012-05-23 ENCOUNTER — Emergency Department (HOSPITAL_COMMUNITY): Payer: Medicare Other

## 2012-05-23 ENCOUNTER — Encounter (HOSPITAL_COMMUNITY): Payer: Self-pay | Admitting: *Deleted

## 2012-05-23 ENCOUNTER — Ambulatory Visit: Payer: Self-pay | Admitting: *Deleted

## 2012-05-23 ENCOUNTER — Ambulatory Visit (INDEPENDENT_AMBULATORY_CARE_PROVIDER_SITE_OTHER): Payer: Medicare Other | Admitting: *Deleted

## 2012-05-23 ENCOUNTER — Encounter (HOSPITAL_COMMUNITY): Payer: Medicare Other

## 2012-05-23 DIAGNOSIS — Z7901 Long term (current) use of anticoagulants: Secondary | ICD-10-CM

## 2012-05-23 DIAGNOSIS — Z86711 Personal history of pulmonary embolism: Secondary | ICD-10-CM | POA: Insufficient documentation

## 2012-05-23 DIAGNOSIS — J45909 Unspecified asthma, uncomplicated: Secondary | ICD-10-CM | POA: Insufficient documentation

## 2012-05-23 DIAGNOSIS — I2699 Other pulmonary embolism without acute cor pulmonale: Secondary | ICD-10-CM

## 2012-05-23 DIAGNOSIS — Y939 Activity, unspecified: Secondary | ICD-10-CM | POA: Insufficient documentation

## 2012-05-23 DIAGNOSIS — I2510 Atherosclerotic heart disease of native coronary artery without angina pectoris: Secondary | ICD-10-CM | POA: Insufficient documentation

## 2012-05-23 DIAGNOSIS — E785 Hyperlipidemia, unspecified: Secondary | ICD-10-CM | POA: Insufficient documentation

## 2012-05-23 DIAGNOSIS — R0789 Other chest pain: Secondary | ICD-10-CM | POA: Insufficient documentation

## 2012-05-23 DIAGNOSIS — F411 Generalized anxiety disorder: Secondary | ICD-10-CM | POA: Insufficient documentation

## 2012-05-23 DIAGNOSIS — Z79899 Other long term (current) drug therapy: Secondary | ICD-10-CM | POA: Insufficient documentation

## 2012-05-23 DIAGNOSIS — X58XXXA Exposure to other specified factors, initial encounter: Secondary | ICD-10-CM | POA: Insufficient documentation

## 2012-05-23 DIAGNOSIS — Y929 Unspecified place or not applicable: Secondary | ICD-10-CM | POA: Insufficient documentation

## 2012-05-23 DIAGNOSIS — S5010XA Contusion of unspecified forearm, initial encounter: Secondary | ICD-10-CM | POA: Insufficient documentation

## 2012-05-23 DIAGNOSIS — S40022A Contusion of left upper arm, initial encounter: Secondary | ICD-10-CM

## 2012-05-23 DIAGNOSIS — R059 Cough, unspecified: Secondary | ICD-10-CM | POA: Insufficient documentation

## 2012-05-23 DIAGNOSIS — R05 Cough: Secondary | ICD-10-CM | POA: Insufficient documentation

## 2012-05-23 LAB — CBC WITH DIFFERENTIAL/PLATELET
Hemoglobin: 11.9 g/dL — ABNORMAL LOW (ref 12.0–15.0)
Lymphocytes Relative: 22 % (ref 12–46)
Lymphs Abs: 2.2 10*3/uL (ref 0.7–4.0)
MCH: 30.4 pg (ref 26.0–34.0)
Monocytes Relative: 11 % (ref 3–12)
Neutro Abs: 6.6 10*3/uL (ref 1.7–7.7)
Neutrophils Relative %: 66 % (ref 43–77)
RBC: 3.92 MIL/uL (ref 3.87–5.11)
WBC: 10 10*3/uL (ref 4.0–10.5)

## 2012-05-23 LAB — BASIC METABOLIC PANEL
GFR calc Af Amer: >90 mL/min (ref >90)
GFR calc non Af Amer: 86 mL/min — ABNORMAL LOW (ref >90)
Glucose, Bld: 110 mg/dL — ABNORMAL HIGH (ref 70–99)
Potassium: 4 mEq/L (ref 3.5–5.1)
Sodium: 141 mEq/L (ref 135–145)

## 2012-05-23 LAB — POCT INR: INR: 1.7

## 2012-05-23 MED ORDER — GUAIFENESIN 100 MG/5ML PO LIQD: 100.0000 mg | Freq: Three times a day (TID) | ORAL | Status: DC | PRN

## 2012-05-23 MED ORDER — ALBUTEROL SULFATE (5 MG/ML) 0.5% IN NEBU
5.0000 mg | INHALATION_SOLUTION | Freq: Once | RESPIRATORY_TRACT | Status: AC
  Administered 2012-05-23: 5 mg via RESPIRATORY_TRACT
  Filled 2012-05-23: qty 1

## 2012-05-23 MED ORDER — IPRATROPIUM BROMIDE 0.02 % IN SOLN
0.5000 mg | Freq: Once | RESPIRATORY_TRACT | Status: AC
  Administered 2012-05-23: 0.5 mg via RESPIRATORY_TRACT
  Filled 2012-05-23: qty 2.5

## 2012-05-23 NOTE — ED Notes (Signed)
?   Blood clot to left arm.  Bruising noted.  Pt also c/o cough/congestion.

## 2012-05-23 NOTE — ED Provider Notes (Signed)
History    This chart was scribed for Carrie Gaskins, MD, MD by Carrie [name], ED Scribe. The patient was seen in room APA06 and the patient's care was started at 7:14PM.   CSN: [id number]  Arrival date & time 05/23/12  1743      Chief Complaint  Patient presents with  . Arm Pain     Patient is a 71 y.o. female presenting with arm pain and cough. The history is provided by the patient and a relative. No language interpreter was used.  Arm Pain This is a new problem.  Cough This is a new problem. The current episode started yesterday. The problem occurs constantly. The problem has not changed since onset.The cough is productive of sputum. There has been no fever. Pertinent negatives include no chills. Her past medical history is significant for asthma.   Carrie [name] is a 71 y.o. female hx of PE who presents to the Emergency Department complaining of constant, moderate productive cough with thick sputum onset 1 day ago. Pt reports having moderate SOB, congestion and chest tightness. She used nebulizers at home without relief.  She reports moderate, burning left arm pain.  She denies injury to left arm, fall, chills, nausea, vomiting, abdominal pain, syncope and any other pain. Pt takes coumadin (last checked was 1.7). Pt denies smoking. Pt takes 80 mg steroid injections every 4 weeks (last injection was November 14).  She reports area of bruising to left forearm, she is concerned that it is a DVT.  She reports cough/congestion similar to prior episosdes of asthma.  No hemoptysis.  She is ambulatory and feels comfortable walking  Past Medical History  Diagnosis Date  . Hyperlipidemia   . Anxiety   . Chronic asthma   . Persistent headaches   . PONV (postoperative nausea and vomiting) 1983  . Coronary artery disease      single vessel CAD involving the mid LAD, s/p BMS to mid LAD 03/26/12, nl LV systolic fxn  . Pulmonary embolism     initiated on lifelong coumadin 03/2012    Past  Surgical History  Procedure Date  . Appendectomy   . Tubal ligation   . Cystic surgery on breast     bilateral  . Left ankle surgery   . Shoulder surgery 06/2010    bone spurs  . Coronary stent placement 03/26/2012  . Coronary angioplasty with stent placement   . Breast surgery     Family History  Problem Relation Age of Onset  . Heart attack Mother   . Heart attack Father   . Cirrhosis Sister   . Diabetes Sister   . Heart attack Sister   . Heart disease Brother   . Diabetes Brother   . Breast cancer Mother   . Ovarian cancer Sister   . Ovarian cancer Sister   . Colon polyps Mother     History  Substance Use Topics  . Smoking status: Never Smoker   . Smokeless tobacco: Not on file     Comment: exposed to 2nd hand smoke  . Alcohol Use: No    OB History    Grav Para Term Preterm Abortions TAB SAB Ect Mult Living                  Review of Systems  Constitutional: Negative for chills.  Respiratory: Positive for cough and chest tightness.   Gastrointestinal: Negative for nausea and vomiting.  Neurological: Negative for syncope.  All other systems reviewed  and are negative.    Allergies  Aspirin; Metoclopramide hcl; Morphine; Adhesive; Codeine; Penicillins; and Sulfonamide derivatives  Home Medications   Current Outpatient Rx  Name  Route  Sig  Dispense  Refill  . ATORVASTATIN CALCIUM 80 MG PO TABS   Oral   Take 1 tablet (80 mg total) by mouth at bedtime.   30 tablet   6   . DONEPEZIL HCL 5 MG PO TABS   Oral   Take 5 mg by mouth at bedtime as needed.         [name] EZETIMIBE 10 MG PO TABS   Oral   Take 10 mg by mouth at bedtime.         . IPRATROPIUM BROMIDE 0.06 % NA SOLN               . CULTURELLE PO   Oral   Take by mouth daily.         [name] LORAZEPAM 1 MG PO TABS   Oral   Take 1 mg by mouth 3 (three) times daily as needed. 0.5 mg in the morning and 1 mg at bedtime;  May also take 1 mg at lunchtime if needed         . MAGNESIUM 500 MG  PO CAPS   Oral   Take 500 mg by mouth daily.         . MOMETASONE FURO-FORMOTEROL FUM 200-5 MCG/ACT IN AERO   Inhalation   Inhale 2 puffs into the lungs 3 (three) times daily.         [name] MONTELUKAST SODIUM 10 MG PO TABS   Oral   Take 10 mg by mouth at bedtime.         . ADULT MULTIVITAMIN W/MINERALS CH   Oral   Take 1 tablet by mouth daily.         [name] NITROGLYCERIN 0.4 MG SL SUBL   Sublingual   Place 1 tablet (0.4 mg total) under the tongue every 5 (five) minutes as needed for chest pain (up to 3 doses).   25 tablet   3   . PANTOPRAZOLE SODIUM 40 MG PO TBEC   Oral   Take 1 tablet (40 mg total) by mouth daily.   30 tablet   6   . PRASUGREL HCL 10 MG PO TABS   Oral   Take 1 tablet (10 mg total) by mouth daily.   30 tablet   0   . PRASUGREL HCL 10 MG PO TABS   Oral   Take 1 tablet (10 mg total) by mouth daily.   30 tablet   2   . RALOXIFENE HCL 60 MG PO TABS   Oral   Take 60 mg by mouth daily.           . TORSEMIDE 20 MG PO TABS               . VENLAFAXINE HCL ER 75 MG PO CP24      1 tablets at bedtime         . WARFARIN SODIUM 5 MG PO TABS      Increase coumadin to 7.5mg  daily except 10mg  on M,W,F   60 tablet   3     BP 124/63  Pulse 95  Temp 99.5 F (37.5 C) (Oral)  Resp 22  Ht 5' 6.5" (1.689 m)  Wt 181 lb (82.101 kg)  BMI 28.78 kg/m2  SpO2 97%  Physical Exam  Nursing note and  vitals reviewed.  CONSTITUTIONAL: Well developed/well nourished HEAD AND FACE: Normocephalic/atraumatic EYES: EOMI/PERRL ENMT: Mucous membranes moist, nasal congestion NECK: supple no meningeal signs SPINE:entire spine nontender CV: S1/S2 noted, no murmurs/rubs/gallops noted LUNGS: wheezing bilaterally, no apparent distress ABDOMEN: soft, nontender, no rebound or guarding GU:no cva tenderness NEURO: Pt is awake/alert, moves all extremitiesx4 EXTREMITIES: pulses normal, full ROM, small area of bruising on proximal left forearm with tenderness.   No  erythema.  No edema noted.   SKIN: warm, color normal PSYCH: no abnormalities of mood noted  ED Course  Procedures  DIAGNOSTIC STUDIES: Oxygen Saturation is 97% on room air, normal by my interpretation.    COORDINATION OF CARE: 7:20 PM Discussed ED treatment with pt     Labs Reviewed  BASIC METABOLIC PANEL - Abnormal; Notable for the following:    Glucose, Bld 110 (*)     GFR calc non Af Amer 86 (*)     All other components within normal limits  CBC WITH DIFFERENTIAL - Abnormal; Notable for the following:    Hemoglobin 11.9 (*)     Monocytes Absolute 1.1 (*)     All other components within normal limits   Dg Chest 2 View  05/23/2012  *RADIOLOGY REPORT*  Clinical Data:  Shortness of breath and cough.  CHEST - 2 VIEW  Comparison: 08/02/2007  Findings: The heart size and mediastinal contours are within normal limits.  Both lungs are clear.  The visualized skeletal structures are unremarkable.  IMPRESSION: No active disease.   Original Report Authenticated By: Irish Lack, M.D.      Pt improved with resp treatment.  No hypoxia and no distress.  She reports she is not supposed to take oral prednisone and has pulmonology followup in two days.  The area of bruising is likely due to fact she is on coumadin and effient.  It does not have appearance of dvt or cellulitis.     MDM  Nursing notes including past medical history and social history reviewed and considered in documentation Labs/vital reviewed and considered xrays reviewed and considered      Date: 05/23/2012  Rate: 77  Rhythm: normal sinus rhythm  QRS Axis: normal  Intervals: normal  ST/T Wave abnormalities: nonspecific ST changes  Conduction Disutrbances:none  Narrative Interpretation:   Old EKG Reviewed: unchanged     I personally performed the services described in this documentation, which was scribed in my presence. The recorded information has been reviewed and is accurate.       Carrie Gaskins,  MD 05/23/12 [phone]

## 2012-05-23 NOTE — ED Notes (Signed)
Pt presents with family secondary to productive cough, and pain in left arm. Pt denies injury to said arm. Pt has not bruising and raised area distal to elbow. Pt has history of bilateral PE's  And is currently on "blood thinners". Pt also has history of uncontrolled asthma. NAD noted at this time.

## 2012-05-25 ENCOUNTER — Encounter (HOSPITAL_COMMUNITY): Payer: Medicare Other

## 2012-05-28 ENCOUNTER — Encounter (HOSPITAL_COMMUNITY): Payer: Medicare Other

## 2012-05-30 ENCOUNTER — Encounter (HOSPITAL_COMMUNITY): Payer: Medicare Other

## 2012-06-01 ENCOUNTER — Encounter (HOSPITAL_COMMUNITY): Payer: Medicare Other

## 2012-06-04 ENCOUNTER — Ambulatory Visit (INDEPENDENT_AMBULATORY_CARE_PROVIDER_SITE_OTHER): Payer: Medicare Other | Admitting: *Deleted

## 2012-06-04 ENCOUNTER — Encounter (HOSPITAL_COMMUNITY): Payer: Medicare Other

## 2012-06-04 DIAGNOSIS — Z7901 Long term (current) use of anticoagulants: Secondary | ICD-10-CM

## 2012-06-04 DIAGNOSIS — I2699 Other pulmonary embolism without acute cor pulmonale: Secondary | ICD-10-CM

## 2012-06-04 LAB — POCT INR: INR: 2.2

## 2012-06-06 ENCOUNTER — Encounter (HOSPITAL_COMMUNITY): Payer: Medicare Other

## 2012-06-08 ENCOUNTER — Encounter (HOSPITAL_COMMUNITY): Payer: Medicare Other

## 2012-06-11 ENCOUNTER — Encounter (HOSPITAL_COMMUNITY): Payer: Medicare Other

## 2012-06-15 ENCOUNTER — Encounter (HOSPITAL_COMMUNITY): Payer: Medicare Other

## 2012-06-18 ENCOUNTER — Encounter (HOSPITAL_COMMUNITY): Payer: Medicare Other

## 2012-06-20 ENCOUNTER — Encounter (HOSPITAL_COMMUNITY): Payer: Medicare Other

## 2012-06-22 ENCOUNTER — Encounter (HOSPITAL_COMMUNITY): Payer: Medicare Other

## 2012-06-22 NOTE — Progress Notes (Signed)
Cardiac Rehabilitation Program Outcomes Report   Orientation:  04/17/2012 !st Week Report: 04/27/2012 Graduate Date:  tbd Discharge Date:  tbd # of sessions completed: 3 DX CAD and Stent X 1  Cardiologist: Nahser Family MD:  Dwana Melena Class Time:  09:30  A.  Exercise Program:  Tolerates exercise @ 3.80  METS for 15 minutes and Walk Test Results:  Pre: Did 6 minute walk test, Resting HR 77, BP 120/70, O2 97%, RPE 9 and RPD 11,  at 6 minute HR 101, BP 120/70, O2 94, RPE 13, and RPD 15, Post HR 77, BP 130/70, O2 99%, RPE 9 and RPD 7. Walked 6 min for 841ft or 1.5 mph.   B.  Mental Health:  Good mental attitude  C.  Education/Instruction/Skills  Accurately checks own pulse.  Rest:  96  Exercise:  114, Knows THR for exercise and Uses Perceived Exertion Scale and/or Dyspnea Scale  Uses Perceived Exertion Scale and/or Dyspnea Scale  D.  Nutrition/Weight Control/Body Composition:  Adherence to prescribed nutrition program: good    E.  Blood Lipids    Lab Results  Component Value Date   CHOL 157 03/25/2012   HDL 71 03/25/2012   LDLCALC 64 03/25/2012   TRIG 109 03/25/2012   CHOLHDL 2.2 03/25/2012    F.  Lifestyle Changes:  Making positive lifestyle changes  G.  Symptoms noted with exercise:  Asymptomatic  Report Completed By:  Lelon Huh. Nathanyl Andujo RN   Comments:  This is patients 1st week report. She achieved a peak METS of 3.80. Her resting HR was 96 and BP was 122/62 and her peak HR was 114 and BP was 138/80. A halfway report will follow at her 18th visit.

## 2012-06-25 ENCOUNTER — Encounter (HOSPITAL_COMMUNITY): Payer: Medicare Other

## 2012-06-27 ENCOUNTER — Encounter: Payer: Self-pay | Admitting: Cardiovascular Disease

## 2012-06-27 ENCOUNTER — Encounter (HOSPITAL_COMMUNITY): Payer: Medicare Other

## 2012-06-29 ENCOUNTER — Encounter (HOSPITAL_COMMUNITY): Payer: Medicare Other

## 2012-07-02 ENCOUNTER — Encounter (HOSPITAL_COMMUNITY): Payer: Medicare Other

## 2012-07-04 ENCOUNTER — Encounter (HOSPITAL_COMMUNITY): Payer: Medicare Other

## 2012-07-06 ENCOUNTER — Encounter (HOSPITAL_COMMUNITY): Payer: Medicare Other

## 2012-07-09 ENCOUNTER — Encounter (HOSPITAL_COMMUNITY): Payer: Medicare Other

## 2012-07-09 ENCOUNTER — Encounter: Payer: Self-pay | Admitting: Cardiovascular Disease

## 2012-07-09 ENCOUNTER — Ambulatory Visit (INDEPENDENT_AMBULATORY_CARE_PROVIDER_SITE_OTHER): Payer: Medicare Other | Admitting: Cardiovascular Disease

## 2012-07-09 ENCOUNTER — Ambulatory Visit (INDEPENDENT_AMBULATORY_CARE_PROVIDER_SITE_OTHER): Payer: Medicare Other | Admitting: Pharmacist

## 2012-07-09 VITALS — BP 138/72 | HR 95 | Resp 18 | Ht 66.0 in | Wt 186.1 lb

## 2012-07-09 DIAGNOSIS — Z7901 Long term (current) use of anticoagulants: Secondary | ICD-10-CM

## 2012-07-09 DIAGNOSIS — I2699 Other pulmonary embolism without acute cor pulmonale: Secondary | ICD-10-CM

## 2012-07-09 LAB — POCT INR: INR: 2.3

## 2012-07-09 MED ORDER — RIVAROXABAN 20 MG PO TABS: 20.0000 mg | ORAL_TABLET | Freq: Every day | ORAL | Status: DC

## 2012-07-09 NOTE — Patient Instructions (Addendum)
Your physician has recommended you make the following change in your medication:   STOP COUMADIN START XARELTO 20 MG DAILY  Your physician recommends that you schedule a follow-up appointment in: 10-08-2012 AT 10:45  Coumadin Appointment in 1 Month

## 2012-07-09 NOTE — Assessment & Plan Note (Addendum)
Carrie [name]  has a history of pulmonary embolus.  She had large bilateral pulmonary and by that resulted in an episode of syncope. I suspect that she'll need to take lifelong anticoagulation if she is able to tolerate it.  She's had lots of difficulty in maintaining a therapeutic INR. Levels are at sometimes too high and then at other times too low. She's having lots of bleeding and bruising all over her body.  She's also on Effient which certainly is exacerbating some of this bleeding and bruising.  She is intolerant to aspirin and was nonresponsive to Plavix so she will need to stay on Effient lifelong.   Given the difficulty in controlling her PT INR levels we will switch her to  Xarelto 20 mg a day.  We'll check her on R. today to make sure that is less than 2.0.

## 2012-07-09 NOTE — Assessment & Plan Note (Signed)
Carrie [name] has had some episodes of chest discomfort but I suspect that they were noncardiac. She has lots of problems with anxiety. We'll continue to watch her closely. I'll see her again in 3 months.

## 2012-07-09 NOTE — Progress Notes (Signed)
Glenna Fellows Date of Birth  [birth year]       [facility]    [place] [address], [address]  [address], [address] [place], [place]  [address]   [place], [place]  [address] [phone]     [phone]   Fax  [phone]    Fax [phone]  Problem List: 1. pulmonary emboli - presented with syncope 2. Hyperlipidemia 3. severe asthma 4. CAD - s/p stenting of LAD-  October, 2013, she is intolerant to Aspirin and is a non-responder to Plavix.  She should stay on Effient ( or similar) lifelong. 5. Severe Asthma -   History of Present Illness:  Megann is a 72 yo who I have seen in the past.  She has been having problems with syncope.  She has a strong family  hx of CAD.  She has been having chest pressure - associated with severe dyspnea.  This occurs in the middle of the night.  It occasionally eases off with Tums or Pepsi.  The pain radiates to her left breast and to her left shoulder blade / left arm.  She has profuse diaphoresis.   These episodes of diaphoresis are followed by syncope.    These episodes of syncope have occurred as much as several times a week.   She was admitted to the hospital recently with an episode of syncope and chest pain.  She was found to have bilateral pulmonary embolus and significant CAD. She had a bare metal stent to the LAD, was treated with Effient.  She was treated with coumadin, and shoulders FE for 3 months. The plan is to reduce the dose of Effient to 5 mg a day after the initial 3 months.  She has had some episodes of chest pain since he left the hospital. Her daughter thinks that there may be due to anxiety. She also has significant asthma and is not able to ambulate mostly because of her asthma. She has a tight asthmatic cough. The cough seems to have worsened over the past couple of weeks.  She's developed some hypoxia walking back from the front of the office today. Her O2 saturation was 92%.  She denies  bleeding.  July 09, 2012: She was admitted to  [facility] for a viral infection resulting is severe asthma.   She has a hx of severe asthma and has had progressive asthma problems.  She has fallen several times and has had lots of bleeding from her wounds on arms and legs.    She also has had some CP and has taken NTG.    Current Outpatient Prescriptions on File Prior to Visit  Medication Sig Dispense Refill  . albuterol (PROAIR HFA) 108 (90 BASE) MCG/ACT inhaler Inhale 2 puffs into the lungs every 6 (six) hours as needed. For rescue/shortness of breath      . atorvastatin (LIPITOR) 80 MG tablet Take 1 tablet (80 mg total) by mouth at bedtime.  30 tablet  6  . donepezil (ARICEPT) 5 MG tablet Take 5 mg by mouth at bedtime.       [name] ezetimibe (ZETIA) 10 MG tablet Take 10 mg by mouth at bedtime.      [name] ipratropium (ATROVENT) 0.06 % nasal spray Place 1 spray into the nose daily.       [name] ipratropium-albuterol (DUONEB) 0.5-2.5 (3) MG/3ML SOLN Take 3 mLs by nebulization every 6 (six) hours as needed. For shortness of breath      .  LORazepam (ATIVAN) 1 MG tablet Take 1 mg by mouth 3 (three) times daily as needed. May also take 1 mg at lunchtime if needed      . Magnesium 500 MG CAPS Take 500 mg by mouth every morning.       . Mometasone Furo-Formoterol Fum (DULERA) 200-5 MCG/ACT AERO Inhale 2 puffs into the lungs 3 (three) times daily.      . montelukast (SINGULAIR) 10 MG tablet Take 10 mg by mouth at bedtime.      . Multiple Vitamin (MULITIVITAMIN WITH MINERALS) TABS Take 1 tablet by mouth every morning.       . nitroGLYCERIN (NITROSTAT) 0.4 MG SL tablet Place 1 tablet (0.4 mg total) under the tongue every 5 (five) minutes as needed for chest pain (up to 3 doses).  25 tablet  3  . pantoprazole (PROTONIX) 40 MG tablet Take 40 mg by mouth every morning.      . prasugrel (EFFIENT) 10 MG TABS Take 10 mg by mouth every morning.       [name] PRESCRIPTION MEDICATION Inject 80 mg as directed every 30  (thirty) days. Steroidal Injection (FOR LUNGS) received every month since June 2013. Administered by Michelle Piper, MD at [facility]. Due for next injection on June 01, 2012.      [name] warfarin (COUMADIN) 5 MG tablet Take 7.5-10 mg by mouth daily. Take two tablets  (10mg  total) daily except for Thursdays and Fridays. Take one & one-half tablets (7.5mg  total) on Thursdays and Fridays. *PATIENT IS TO TAKE 15mg  TONIGHT ONLY*      . Lactobacillus Rhamnosus, GG, (CULTURELLE PO) Take 1 tablet by mouth at bedtime.       [name] venlafaxine (EFFEXOR-XR) 75 MG 24 hr capsule Take 75 mg by mouth at bedtime. 1 tablets at bedtime        Allergies  Allergen Reactions  . Aspirin Hives  . Metoclopramide Hcl     REACTION: intolerant, restless hands  . Morphine Hives  . Adhesive (Tape) Itching and Rash    ONLY TO USE PAPER TAPE  . Codeine Itching and Rash  . Penicillins Itching and Rash  . Sulfonamide Derivatives Itching and Rash    Past Medical History  Diagnosis Date  . Hyperlipidemia   . Anxiety   . Chronic asthma   . Persistent headaches   . PONV (postoperative nausea and vomiting) 1983  . Coronary artery disease      single vessel CAD involving the mid LAD, s/p BMS to mid LAD 03/26/12, nl LV systolic fxn  . Pulmonary embolism     initiated on lifelong coumadin 03/2012    Past Surgical History  Procedure Date  . Appendectomy   . Tubal ligation   . Cystic surgery on breast     bilateral  . Left ankle surgery   . Shoulder surgery 06/2010    bone spurs  . Coronary stent placement 03/26/2012  . Coronary angioplasty with stent placement   . Breast surgery     History  Smoking status  . Never Smoker   Smokeless tobacco  . Not on file    Comment: exposed to 2nd hand smoke    History  Alcohol Use No    Family History  Problem Relation Age of Onset  . Heart attack Mother   . Heart attack Father   . Cirrhosis Sister   . Diabetes Sister   . Heart attack Sister    . Heart disease Brother   .  Diabetes Brother   . Breast cancer Mother   . Ovarian cancer Sister   . Ovarian cancer Sister   . Colon polyps Mother     Reviw of Systems:  Reviewed in the HPI.  All other systems are negative.  Physical Exam: Blood pressure 138/72, pulse 95, resp. rate 18, height 5\' 6"  (1.676 m), weight 186 lb 1.9 oz (84.423 kg), SpO2 95.00%. General: Well developed, well nourished, in no acute distress.  Head: Normocephalic, atraumatic, sclera non-icteric, mucus membranes are moist,   Neck: Supple. Carotids are 2 + without bruits. No JVD  Lungs: She has a few wheezes with expiration. She states that these wheezes are fairly common for her.  Heart: regular rate.  normal  S1 S2. No murmurs, gallops or rubs.  Abdomen: Soft, non-tender, non-distended with normal bowel sounds. No hepatomegaly. No rebound/guarding. No masses.  Msk:  Strength and tone are normal  Extremities: No clubbing or cyanosis. No edema.  Distal pedal pulses are 2+ and equal bilaterally.  Neuro: Alert and oriented X 3. Moves all extremities spontaneously.  Psych:  Responds to questions appropriately with a normal affect.  ECG: Oct. 1, 2013 - NSR at 73,  Low voltage QRS.  O2 sat after walking was 92%.  Assessment / Plan:

## 2012-07-11 ENCOUNTER — Encounter (HOSPITAL_COMMUNITY): Payer: Medicare Other

## 2012-07-13 ENCOUNTER — Encounter (HOSPITAL_COMMUNITY): Payer: Medicare Other

## 2012-07-23 NOTE — Progress Notes (Signed)
Pulmonary Rehabilitation Program Outcomes Report   Orientation:  04/17/2012 Graduate Date:  N/A Discharge Date:  05/21/2012 # of sessions completed: 9 DX: cad AND Stent  x1  Cardiologist: Nahser Family MD:  Dwana Melena Class Time:  09:30  A.  Exercise Program:  Tolerates exercise @ 3.80 METS for 15 minutes, Walk Test Results:  Post: Stopped DId not finish program, No Change functional capacity  0 %, No Change  flexibility 0 %, Needs encouragement on exercise program and Discharged  B.  Mental Health:  Good mental attitude  C.  Education/Instruction/Skills  Accurately checks own pulse.  Rest:  77  Exercise:  103, Knows THR for exercise, Uses Perceived Exertion Scale and/or Dyspnea Scale and Attended 5 education classes Education score 89%  Uses Perceived Exertion Scale and/or Dyspnea Scale  D.  Nutrition/Weight Control/Body Composition:  Adherence to prescribed nutrition program: good , % Body Fat  38.6 and Complimentary/Alt. Therapy  Cholesterol 157, Triglycerides 109,  HDL 71 and LDL 64    E.  Blood Lipids    Lab Results  Component Value Date   CHOL 157 03/25/2012   HDL 71 03/25/2012   LDLCALC 64 03/25/2012   TRIG 109 03/25/2012   CHOLHDL 2.2 03/25/2012    F.  Lifestyle Changes:  Making positive lifestyle changes  G.  Symptoms noted with exercise:  Asymptomatic  Report Completed By:  Lelon Huh. Rochanda Harpham RN   Comments:  This is patients Last report. She only attended 9 sessions.  She achieved a peak METS of 3.80. Her resting HR was 77 and resting BP was 140/70 and Peak HR was 103 and peak BP was 142/60. She did not return to rehab.

## 2012-07-23 NOTE — Progress Notes (Signed)
Patient stopped Cardiac Rehabilitation today on 05/21/12 without completing 36 sessions. She only completed 9 visits. She achieved LTG of 30 minutes of aerobic exercise at Max Met level of 3.08. All patients vitals are WNL. Patient has met with dietician. No exit home exercise materials were given or discussed.

## 2012-07-23 NOTE — Patient Instructions (Signed)
Patient was told that if she changes her mind and wants to return to give [place] a call.

## 2012-07-24 ENCOUNTER — Other Ambulatory Visit: Payer: Self-pay | Admitting: *Deleted

## 2012-07-24 ENCOUNTER — Telehealth: Payer: Self-pay | Admitting: *Deleted

## 2012-07-24 MED ORDER — RIVAROXABAN 20 MG PO TABS: 20.0000 mg | ORAL_TABLET | Freq: Every day | ORAL | Status: DC

## 2012-07-24 MED ORDER — PRASUGREL HCL 10 MG PO TABS: 10.0000 mg | ORAL_TABLET | Freq: Every morning | ORAL | Status: DC

## 2012-07-24 NOTE — Telephone Encounter (Signed)
Fax Received. Refill Completed. Shakira Los Chowoe (R.M.A)   

## 2012-07-24 NOTE — Telephone Encounter (Signed)
NEED PRIOR AUTH FOR XARELTO. notes reviewed.  PER Dr Stuckey's addendum and Dr Harvie Bridge ov note.     Shawnie Pons, MD 03/24/2012 7:55 AM Addendum  Subjective:  . Probably a clopidogrel non responsder, and with ASA allergy, will need to consider prasugrel as primary option. She is not a candidate for ticagrelor due to asthma.      Pulmonary embolus - Elyn Aquas., MD 07/09/2012 12:46 PM Addendum  Eber Jones has a history of pulmonary embolus. She had large bilateral pulmonary and by that resulted in an episode of syncope. I suspect that she'll need to take lifelong anticoagulation if she is able to tolerate it.  She's had lots of difficulty in maintaining a therapeutic INR. Levels are at sometimes too high and then at other times too low. She's having lots of bleeding and bruising all over her body. She's also on Effient which certainly is exacerbating some of this bleeding and bruising. She is intolerant to aspirin and was nonresponsive to Plavix so she will need to stay on Effient lifelong.  Given the difficulty in controlling her PT INR levels we will switch her to Xarelto 20 mg a day.

## 2012-07-24 NOTE — Telephone Encounter (Signed)
Prior Carrie [name] was obtained for one year, script sent for one year refill.

## 2012-08-04 ENCOUNTER — Other Ambulatory Visit: Payer: Self-pay

## 2012-08-08 ENCOUNTER — Telehealth: Payer: Self-pay

## 2012-08-08 NOTE — Telephone Encounter (Signed)
Received a call from patient's daughter Aram Beecham.She stated mother passed out yesterday 08/07/12.States she was bending over to get something under bed and raised back up went walking down hall and passed out.Also states mother has been having nose bleeds x 5 since starting on xareltp 20 mg daily 07/09/12.Complains of coughing small amounts of bright red blood.Also multiple bruising on arms and legs.States has to apply bandages on bruises with bleeding from bandages. Spoke to Dr.Nasher he advised patient needs cbc,bmet.Also advised patient needs to see PCP Dr.Zack Margo Aye.Daughter stated she will call Dr.Hall's office tomorrow 08/09/12 and schedule mother appointment and will have Dr.Hall to do cbc and bmet.Advised to fax Dr.Nasher a copy of lab results.

## 2012-08-09 ENCOUNTER — Telehealth: Payer: Self-pay | Admitting: Cardiovascular Disease

## 2012-08-09 ENCOUNTER — Ambulatory Visit (INDEPENDENT_AMBULATORY_CARE_PROVIDER_SITE_OTHER): Payer: Medicare Other | Admitting: *Deleted

## 2012-08-09 DIAGNOSIS — I2699 Other pulmonary embolism without acute cor pulmonale: Secondary | ICD-10-CM

## 2012-08-09 DIAGNOSIS — R55 Syncope and collapse: Secondary | ICD-10-CM

## 2012-08-09 DIAGNOSIS — R0789 Other chest pain: Secondary | ICD-10-CM

## 2012-08-09 LAB — BASIC METABOLIC PANEL
Chloride: 104 mEq/L (ref 96–112)
GFR: 92.19 mL/min (ref >60.00)
Glucose, Bld: 99 mg/dL (ref 70–99)
Potassium: 4.3 mEq/L (ref 3.5–5.1)
Sodium: 141 mEq/L (ref 135–145)

## 2012-08-09 LAB — CBC
MCV: 91.1 fl (ref 78.0–100.0)
RBC: 4.41 Mil/uL (ref 3.87–5.11)
WBC: 10.7 10*3/uL — ABNORMAL HIGH (ref 4.5–10.5)

## 2012-08-09 MED ORDER — RIVAROXABAN 15 MG PO TABS: 15.0000 mg | ORAL_TABLET | Freq: Every day | ORAL | Status: DC

## 2012-08-09 MED ORDER — PRASUGREL HCL 5 MG PO TABS: 5.0000 mg | ORAL_TABLET | Freq: Every morning | ORAL | Status: DC

## 2012-08-09 NOTE — Progress Notes (Signed)
Pt was started on Xarelto 20 mg daily  for P E  on 01/20/014.    Reviewed patients medication list.  Pt is not  currently on any combined P-gp and strong CYP3A4 inhibitors/inducers (ketoconazole, traconazole, ritonavir, carbamazepine, phenytoin, rifampin, St. John's wort).  Reviewed labs.  SCr 0.7, Weight 84.423 CrCl-98.23.  Dose was appropriate based on renal function.   Hgb 12.9 and HCT 40.1 She is taking prasugrel 10mg  daily started 10/13 post BMS placed during same hospitalization PE was diagnosed.    A full discussion of the nature of anticoagulants has been carried out.  A benefit/risk analysis has been presented to the patient, so that they understand the justification for choosing anticoagulation with Xarelto at this time.  The need for compliance is stressed.  Pt is aware to take the medication once daily with the largest meal of the day.  Side effects of potential bleeding are discussed, including unusual colored urine or stools, coughing up blood or coffee ground emesis, nose bleeds or serious fall or head trauma.  Discussed signs and symptoms of stroke. The patient should avoid any OTC items containing aspirin or ibuprofen.  Avoid alcohol consumption.   Call if any signs of abnormal bleeding.  Discussed financial obligations and resolved any difficulty in obtaining medication.  Next lab test test in 6 months.  She stopped coumadin d/t erratic INR's and started Xarelto 20 mg daily on  07/09/2012.  After being on Xarelto  one week she started having nose bleeds and noted bruising and red scabbed areas that would start to bleed on lower legs and arms .  She had chest pressure and syncopal episode 2/18 after bending over getting something from under the bed,  but resolved quickly without hitting head.  Her case was discussed indepth with Dr. Clifton James - it was decided given the extensive bruising and need to remain on both Xarelto and Effient that we would decrease her doses to Xarelto 15mg  daily and  Effient 5mg  daily.  Her blood pressure was taken with orthostatics to ensure that was not the reason to fall - BP 120/80 sitting and 128/90 standing.  An EKG will be obtained as well.and was  within normal limits for pt   Must continue to take Effient  CBC and Bmet done on this visit  Pt was called and instructed to pick up prescriptions for Effient  5mg   Daily and Xarelto 15mg  daily and that all lab values were within normal range and pt verbalized understanding

## 2012-08-09 NOTE — Telephone Encounter (Signed)
Patient's daughter Arline Asp called no answer.LMTC.

## 2012-08-09 NOTE — Telephone Encounter (Signed)
New Problem:    Patient daughter called in because she was instructed to get back in touch with our office by Dr. Scharlene Gloss office.  Also wanted to know if her mother needed to come in for her appointment today.  Please call back.

## 2012-08-10 NOTE — Telephone Encounter (Signed)
Spoke with patient's daughter Arline Asp she stated mother saw her lung doctor at [facility] today 08/10/12 and he advised mother to see a ENT about her nose bleeds.States she has appointment with ENT next week.

## 2012-09-18 ENCOUNTER — Other Ambulatory Visit: Payer: Self-pay | Admitting: Dermatology

## 2012-10-08 ENCOUNTER — Ambulatory Visit: Payer: Medicare Other | Admitting: Cardiovascular Disease

## 2012-11-01 ENCOUNTER — Other Ambulatory Visit: Payer: Self-pay | Admitting: Dermatology

## 2012-11-02 ENCOUNTER — Other Ambulatory Visit: Payer: Self-pay | Admitting: *Deleted

## 2012-11-02 MED ORDER — PANTOPRAZOLE SODIUM 40 MG PO TBEC: 40.0000 mg | DELAYED_RELEASE_TABLET | Freq: Every morning | ORAL | Status: DC

## 2012-11-29 ENCOUNTER — Other Ambulatory Visit (HOSPITAL_COMMUNITY): Payer: Self-pay | Admitting: *Deleted

## 2012-11-29 MED ORDER — ATORVASTATIN CALCIUM 80 MG PO TABS: 80.0000 mg | ORAL_TABLET | Freq: Every day | ORAL | Status: DC

## 2012-11-29 NOTE — Telephone Encounter (Signed)
Fax Received. Refill Completed. Carrie [name] (R.M.A)   

## 2012-12-05 ENCOUNTER — Emergency Department (HOSPITAL_COMMUNITY): Payer: Medicare Other

## 2012-12-05 ENCOUNTER — Observation Stay (HOSPITAL_COMMUNITY)
Admission: AD | Admit: 2012-12-05 | Discharge: 2012-12-07 | Disposition: A | Discharge status: Home / Self Care | Payer: Medicare Other | Attending: Internal Medicine | Admitting: Internal Medicine

## 2012-12-05 ENCOUNTER — Encounter (HOSPITAL_COMMUNITY): Payer: Self-pay

## 2012-12-05 DIAGNOSIS — I2510 Atherosclerotic heart disease of native coronary artery without angina pectoris: Secondary | ICD-10-CM

## 2012-12-05 DIAGNOSIS — J45909 Unspecified asthma, uncomplicated: Secondary | ICD-10-CM | POA: Insufficient documentation

## 2012-12-05 DIAGNOSIS — M7989 Other specified soft tissue disorders: Secondary | ICD-10-CM | POA: Insufficient documentation

## 2012-12-05 DIAGNOSIS — Z79899 Other long term (current) drug therapy: Secondary | ICD-10-CM | POA: Insufficient documentation

## 2012-12-05 DIAGNOSIS — F329 Major depressive disorder, single episode, unspecified: Secondary | ICD-10-CM | POA: Diagnosis present

## 2012-12-05 DIAGNOSIS — I7090 Unspecified atherosclerosis: Secondary | ICD-10-CM

## 2012-12-05 DIAGNOSIS — F419 Anxiety disorder, unspecified: Secondary | ICD-10-CM | POA: Diagnosis present

## 2012-12-05 DIAGNOSIS — R079 Chest pain, unspecified: Principal | ICD-10-CM

## 2012-12-05 DIAGNOSIS — K08409 Partial loss of teeth, unspecified cause, unspecified class: Secondary | ICD-10-CM

## 2012-12-05 DIAGNOSIS — F3289 Other specified depressive episodes: Secondary | ICD-10-CM | POA: Insufficient documentation

## 2012-12-05 DIAGNOSIS — Z7902 Long term (current) use of antithrombotics/antiplatelets: Secondary | ICD-10-CM | POA: Insufficient documentation

## 2012-12-05 DIAGNOSIS — F411 Generalized anxiety disorder: Secondary | ICD-10-CM | POA: Insufficient documentation

## 2012-12-05 DIAGNOSIS — Z7901 Long term (current) use of anticoagulants: Secondary | ICD-10-CM

## 2012-12-05 DIAGNOSIS — Z86711 Personal history of pulmonary embolism: Secondary | ICD-10-CM

## 2012-12-05 DIAGNOSIS — K08109 Complete loss of teeth, unspecified cause, unspecified class: Secondary | ICD-10-CM

## 2012-12-05 DIAGNOSIS — Z9861 Coronary angioplasty status: Secondary | ICD-10-CM | POA: Insufficient documentation

## 2012-12-05 DIAGNOSIS — Z8249 Family history of ischemic heart disease and other diseases of the circulatory system: Secondary | ICD-10-CM | POA: Insufficient documentation

## 2012-12-05 HISTORY — DX: Atherosclerotic heart disease of native coronary artery without angina pectoris: I25.10

## 2012-12-05 HISTORY — DX: Long term (current) use of anticoagulants: Z79.01

## 2012-12-05 LAB — COMPREHENSIVE METABOLIC PANEL
ALT: 22 U/L (ref 0–35)
Alkaline Phosphatase: 80 U/L (ref 39–117)
BUN: 16 mg/dL (ref 6–23)
Chloride: 102 mEq/L (ref 96–112)
GFR calc Af Amer: >90 mL/min (ref >90)
Glucose, Bld: 103 mg/dL — ABNORMAL HIGH (ref 70–99)
Potassium: 3.6 mEq/L (ref 3.5–5.1)
Total Bilirubin: 0.3 mg/dL (ref 0.3–1.2)

## 2012-12-05 LAB — CBC WITH DIFFERENTIAL/PLATELET
Hemoglobin: 12.3 g/dL (ref 12.0–15.0)
Lymphocytes Relative: 19 % (ref 12–46)
Lymphs Abs: 2.2 10*3/uL (ref 0.7–4.0)
Monocytes Relative: 8 % (ref 3–12)
Neutro Abs: 8.4 10*3/uL — ABNORMAL HIGH (ref 1.7–7.7)
Neutrophils Relative %: 72 % (ref 43–77)
RBC: 4.28 MIL/uL (ref 3.87–5.11)
WBC: 11.5 10*3/uL — ABNORMAL HIGH (ref 4.0–10.5)

## 2012-12-05 LAB — APTT: aPTT: 27 seconds (ref 24–37)

## 2012-12-05 LAB — POCT I-STAT TROPONIN I

## 2012-12-05 MED ORDER — IPRATROPIUM BROMIDE 0.02 % IN SOLN
0.5000 mg | Freq: Four times a day (QID) | RESPIRATORY_TRACT | Status: DC
  Administered 2012-12-06 (×3): 0.5 mg via RESPIRATORY_TRACT
  Filled 2012-12-05 (×4): qty 2.5

## 2012-12-05 MED ORDER — VENLAFAXINE HCL ER 75 MG PO CP24
75.0000 mg | ORAL_CAPSULE | Freq: Every day | ORAL | Status: DC
  Administered 2012-12-05 – 2012-12-06 (×2): 75 mg via ORAL
  Filled 2012-12-05 (×3): qty 1

## 2012-12-05 MED ORDER — IPRATROPIUM-ALBUTEROL 0.5-2.5 (3) MG/3ML IN SOLN: 3.0000 mL | RESPIRATORY_TRACT | Status: DC | PRN

## 2012-12-05 MED ORDER — SENNA 8.6 MG PO TABS
1.0000 | ORAL_TABLET | Freq: Two times a day (BID) | ORAL | Status: DC
  Administered 2012-12-05 – 2012-12-07 (×4): 8.6 mg via ORAL
  Filled 2012-12-05 (×5): qty 1

## 2012-12-05 MED ORDER — ACETAMINOPHEN 325 MG PO TABS
650.0000 mg | ORAL_TABLET | Freq: Four times a day (QID) | ORAL | Status: DC | PRN
  Administered 2012-12-06: 650 mg via ORAL
  Filled 2012-12-05 (×2): qty 2

## 2012-12-05 MED ORDER — PANTOPRAZOLE SODIUM 40 MG PO TBEC
40.0000 mg | DELAYED_RELEASE_TABLET | Freq: Every morning | ORAL | Status: DC
  Administered 2012-12-06 – 2012-12-07 (×2): 40 mg via ORAL
  Filled 2012-12-05 (×2): qty 1

## 2012-12-05 MED ORDER — RISAQUAD PO CAPS
1.0000 | ORAL_CAPSULE | Freq: Every day | ORAL | Status: DC
  Administered 2012-12-05 – 2012-12-07 (×3): 1 via ORAL
  Filled 2012-12-05 (×3): qty 1

## 2012-12-05 MED ORDER — ALBUTEROL SULFATE (5 MG/ML) 0.5% IN NEBU: 2.5000 mg | INHALATION_SOLUTION | RESPIRATORY_TRACT | Status: DC | PRN

## 2012-12-05 MED ORDER — ONDANSETRON HCL 4 MG/2ML IJ SOLN: 4.0000 mg | Freq: Four times a day (QID) | INTRAMUSCULAR | Status: DC | PRN

## 2012-12-05 MED ORDER — NITROGLYCERIN 2 % TD OINT
1.0000 [in_us] | TOPICAL_OINTMENT | Freq: Once | TRANSDERMAL | Status: AC
  Administered 2012-12-05: 1 [in_us] via TOPICAL
  Filled 2012-12-05: qty 1

## 2012-12-05 MED ORDER — MONTELUKAST SODIUM 10 MG PO TABS
10.0000 mg | ORAL_TABLET | Freq: Every day | ORAL | Status: DC
  Administered 2012-12-05 – 2012-12-06 (×2): 10 mg via ORAL
  Filled 2012-12-05 (×3): qty 1

## 2012-12-05 MED ORDER — RIVAROXABAN 10 MG PO TABS
15.0000 mg | ORAL_TABLET | Freq: Every day | ORAL | Status: DC
  Administered 2012-12-05: 15 mg via ORAL
  Filled 2012-12-05 (×2): qty 1

## 2012-12-05 MED ORDER — TRAZODONE HCL 50 MG PO TABS
50.0000 mg | ORAL_TABLET | Freq: Every day | ORAL | Status: DC
  Administered 2012-12-05 – 2012-12-06 (×2): 50 mg via ORAL
  Filled 2012-12-05 (×3): qty 1

## 2012-12-05 MED ORDER — MOMETASONE FURO-FORMOTEROL FUM 200-5 MCG/ACT IN AERO
2.0000 | INHALATION_SPRAY | Freq: Three times a day (TID) | RESPIRATORY_TRACT | Status: DC
  Administered 2012-12-06: 2 via RESPIRATORY_TRACT
  Filled 2012-12-05: qty 8.8

## 2012-12-05 MED ORDER — ATORVASTATIN CALCIUM 40 MG PO TABS
80.0000 mg | ORAL_TABLET | Freq: Every day | ORAL | Status: DC
  Administered 2012-12-05 – 2012-12-06 (×2): 80 mg via ORAL
  Filled 2012-12-05 (×3): qty 2

## 2012-12-05 MED ORDER — POTASSIUM CHLORIDE IN NACL 20-0.9 MEQ/L-% IV SOLN
INTRAVENOUS | Status: DC
  Administered 2012-12-05: 21:00:00 via INTRAVENOUS
  Filled 2012-12-05: qty 1000

## 2012-12-05 MED ORDER — ALBUTEROL SULFATE (5 MG/ML) 0.5% IN NEBU
2.5000 mg | INHALATION_SOLUTION | Freq: Four times a day (QID) | RESPIRATORY_TRACT | Status: DC
  Administered 2012-12-06 (×3): 2.5 mg via RESPIRATORY_TRACT
  Filled 2012-12-05 (×4): qty 0.5

## 2012-12-05 MED ORDER — RIVAROXABAN 15 MG PO TABS
ORAL_TABLET | ORAL | Status: AC
  Filled 2012-12-05: qty 1

## 2012-12-05 MED ORDER — POTASSIUM CHLORIDE CRYS ER 20 MEQ PO TBCR: 20.0000 meq | EXTENDED_RELEASE_TABLET | Freq: Two times a day (BID) | ORAL | Status: DC

## 2012-12-05 MED ORDER — ACETAMINOPHEN 650 MG RE SUPP: 650.0000 mg | Freq: Four times a day (QID) | RECTAL | Status: DC | PRN

## 2012-12-05 MED ORDER — POTASSIUM CHLORIDE CRYS ER 20 MEQ PO TBCR
20.0000 meq | EXTENDED_RELEASE_TABLET | Freq: Two times a day (BID) | ORAL | Status: DC
  Administered 2012-12-06 – 2012-12-07 (×3): 20 meq via ORAL
  Filled 2012-12-05 (×5): qty 1

## 2012-12-05 MED ORDER — ONDANSETRON HCL 4 MG PO TABS: 4.0000 mg | ORAL_TABLET | Freq: Four times a day (QID) | ORAL | Status: DC | PRN

## 2012-12-05 MED ORDER — NITROGLYCERIN 2 % TD OINT
0.5000 [in_us] | TOPICAL_OINTMENT | Freq: Four times a day (QID) | TRANSDERMAL | Status: DC
  Administered 2012-12-06 (×2): 0.5 [in_us] via TOPICAL
  Filled 2012-12-05 (×2): qty 1

## 2012-12-05 MED ORDER — ADULT MULTIVITAMIN W/MINERALS CH
1.0000 | ORAL_TABLET | Freq: Every morning | ORAL | Status: DC
  Administered 2012-12-06 – 2012-12-07 (×2): 1 via ORAL
  Filled 2012-12-05 (×2): qty 1

## 2012-12-05 MED ORDER — FUROSEMIDE 40 MG PO TABS
40.0000 mg | ORAL_TABLET | Freq: Every day | ORAL | Status: DC
  Administered 2012-12-06 – 2012-12-07 (×2): 40 mg via ORAL
  Filled 2012-12-05 (×2): qty 1

## 2012-12-05 MED ORDER — HYDROCODONE-ACETAMINOPHEN 5-325 MG PO TABS
1.0000 | ORAL_TABLET | ORAL | Status: DC | PRN
Start: 2012-12-05 — End: 2012-12-07
  Administered 2012-12-05 – 2012-12-07 (×3): 1 via ORAL
  Filled 2012-12-05 (×3): qty 1

## 2012-12-05 MED ORDER — HYDROCODONE-ACETAMINOPHEN 5-325 MG PO TABS
1.0000 | ORAL_TABLET | Freq: Once | ORAL | Status: AC
  Administered 2012-12-05: 1 via ORAL
  Filled 2012-12-05: qty 1

## 2012-12-05 MED ORDER — BENZONATATE 100 MG PO CAPS
100.0000 mg | ORAL_CAPSULE | Freq: Two times a day (BID) | ORAL | Status: DC
  Administered 2012-12-05 – 2012-12-07 (×4): 100 mg via ORAL
  Filled 2012-12-05 (×5): qty 1

## 2012-12-05 MED ORDER — EZETIMIBE 10 MG PO TABS
10.0000 mg | ORAL_TABLET | Freq: Every day | ORAL | Status: DC
  Administered 2012-12-05 – 2012-12-06 (×2): 10 mg via ORAL
  Filled 2012-12-05 (×3): qty 1

## 2012-12-05 MED ORDER — ALBUTEROL SULFATE HFA 108 (90 BASE) MCG/ACT IN AERS
2.0000 | INHALATION_SPRAY | Freq: Four times a day (QID) | RESPIRATORY_TRACT | Status: DC
  Administered 2012-12-05: 2 via RESPIRATORY_TRACT
  Filled 2012-12-05: qty 6.7

## 2012-12-05 MED ORDER — LORAZEPAM 1 MG PO TABS
1.0000 mg | ORAL_TABLET | Freq: Three times a day (TID) | ORAL | Status: DC
  Administered 2012-12-05 – 2012-12-07 (×4): 1 mg via ORAL
  Filled 2012-12-05 (×5): qty 1

## 2012-12-05 MED ORDER — IPRATROPIUM BROMIDE 0.02 % IN SOLN: 0.5000 mg | RESPIRATORY_TRACT | Status: DC | PRN

## 2012-12-05 MED ORDER — GUAIFENESIN-DM 100-10 MG/5ML PO SYRP
5.0000 mL | ORAL_SOLUTION | ORAL | Status: DC | PRN
  Administered 2012-12-06 (×2): 5 mL via ORAL
  Filled 2012-12-05 (×2): qty 5

## 2012-12-05 MED ORDER — ALUM & MAG HYDROXIDE-SIMETH 200-200-20 MG/5ML PO SUSP: 30.0000 mL | Freq: Four times a day (QID) | ORAL | Status: DC | PRN

## 2012-12-05 MED ORDER — FLUTICASONE PROPIONATE 50 MCG/ACT NA SUSP
2.0000 | Freq: Every day | NASAL | Status: DC
  Administered 2012-12-06: 2 via NASAL
  Filled 2012-12-05: qty 16

## 2012-12-05 MED ORDER — DONEPEZIL HCL 5 MG PO TABS
10.0000 mg | ORAL_TABLET | Freq: Every evening | ORAL | Status: DC | PRN
  Filled 2012-12-05: qty 2

## 2012-12-05 NOTE — ED Provider Notes (Signed)
History     CSN: [id number]  Arrival date & time 12/05/12  1356   First MD Initiated Contact with Patient 12/05/12 1401      Chief Complaint  Patient presents with  . Chest Pain    (Consider location/radiation/quality/duration/timing/severity/associated sxs/prior treatment) HPI  Patient reports she has a very strong family history of heart disease. She states her father died at age 71 from heart disease, her mother died age 45 from heart disease, she has a sister who's had a heart attack and  5 stents and a brother who's had a heart attack and 3 stents. She reports that she was having passing out spells with chest pain and in October 2013 she had a cardiac catheterization done to evaluate the symptoms. She states she had a blockage of the main artery of her heart and did have one stent placed. She states however she was subsequently diagnosed with a pulmonary embolism. She is currently on both xarelto and effient. She reports about 2 months ago she had chest pain that was relieved when EMS gave HER-2 sublingual nitroglycerin however she refused for them to transport her to the hospital for further evaluation. Just reports she's been having swelling in her left leg for the past 3 months. She was seen by her PCP last week and her fluid pills were increased for that, however it is still swollen. She states this morning she had a tooth pulled on her right upper mouth. This was about 11:00 this morning. She went home and laid down to take a nap and she was awakened with severe chest pain in her left chest that radiated into her left posterior shoulder. She states it was a "hard pain". She rates it as a 10 out of 10. Her family states she was pale. She describes extreme diaphoresis with nausea but no vomiting. She also states it was extremely hard to breathe. She was given one sublingual nitroglycerin by her family and her pain resolved. She states the pain lasted a total of about 4 minutes. She was not  told to stop her xarelto or effient prior to having her tooth extracted.    PCP Dr Margo Aye Cardiologist Dr Elease Hashimoto  Past Medical History  Diagnosis Date  . Hyperlipidemia   . Anxiety   . Chronic asthma   . Persistent headaches   . PONV (postoperative nausea and vomiting) 1983  . Coronary artery disease      single vessel CAD involving the mid LAD, s/p BMS to mid LAD 03/26/12, nl LV systolic fxn  . Pulmonary embolism     initiated on lifelong coumadin 03/2012    Past Surgical History  Procedure Laterality Date  . Appendectomy    . Tubal ligation    . Cystic surgery on breast      bilateral  . Left ankle surgery    . Shoulder surgery  06/2010    bone spurs  . Coronary stent placement  03/26/2012  . Coronary angioplasty with stent placement    . Breast surgery      Family History  Problem Relation Age of Onset  . Heart attack Mother   . Heart attack Father   . Cirrhosis Sister   . Diabetes Sister   . Heart attack Sister   . Heart disease Brother   . Diabetes Brother   . Breast cancer Mother   . Ovarian cancer Sister   . Ovarian cancer Sister   . Colon polyps Mother     History  Substance Use Topics  . Smoking status: Never Smoker   . Smokeless tobacco: Not on file     Comment: exposed to 2nd hand smoke  . Alcohol Use: No  lives at home Lives with daughter and her husband Widowed   OB History   Grav Para Term Preterm Abortions TAB SAB Ect Mult Living                  Review of Systems  All other systems reviewed and are negative.    Allergies  Aspirin; Metoclopramide hcl; Morphine; Adhesive; Codeine; Penicillins; and Sulfonamide derivatives  Home Medications   Current Outpatient Rx  Name  Route  Sig  Dispense  Refill  . albuterol (PROAIR HFA) 108 (90 BASE) MCG/ACT inhaler   Inhalation   Inhale 2 puffs into the lungs every 6 (six) hours as needed. For rescue/shortness of breath         . atorvastatin (LIPITOR) 80 MG tablet   Oral   Take 1 tablet  (80 mg total) by mouth at bedtime.   30 tablet   6   . benzonatate (TESSALON) 100 MG capsule   Oral   Take 100 mg by mouth 2 (two) times daily.         . bisacodyl (DULCOLAX) 5 MG EC tablet   Oral   Take 5 mg by mouth daily as needed for constipation.         . budesonide-formoterol (SYMBICORT) 80-4.5 MCG/ACT inhaler   Inhalation   Inhale 2 puffs into the lungs daily as needed (shortness of breath).          . docusate sodium (COLACE) 100 MG capsule   Oral   Take 100 mg by mouth daily.         [name] donepezil (ARICEPT) 10 MG tablet   Oral   Take 10 mg by mouth at bedtime as needed.         . Esomeprazole Magnesium (NEXIUM PO)   Oral   Take 1 capsule by mouth at bedtime.         [name] ezetimibe (ZETIA) 10 MG tablet   Oral   Take 10 mg by mouth at bedtime.         . fluticasone (FLONASE) 50 MCG/ACT nasal spray   Nasal   Place 2 sprays into the nose 2 (two) times daily.         . furosemide (LASIX) 40 MG tablet   Oral   Take 40 mg by mouth daily.         [name] ipratropium-albuterol (DUONEB) 0.5-2.5 (3) MG/3ML SOLN   Nebulization   Take 3 mLs by nebulization every 6 (six) hours as needed. For shortness of breath         . LORazepam (ATIVAN) 1 MG tablet   Oral   Take 1 mg by mouth 3 (three) times daily as needed. May also take 1 mg at lunchtime if needed         . Magnesium 500 MG CAPS   Oral   Take 500 mg by mouth every morning.          . Mometasone Furo-Formoterol Fum (DULERA) 200-5 MCG/ACT AERO   Inhalation   Inhale 2 puffs into the lungs 3 (three) times daily.         . montelukast (SINGULAIR) 10 MG tablet   Oral   Take 10 mg by mouth at bedtime.         . Multiple Vitamin (  MULITIVITAMIN WITH MINERALS) TABS   Oral   Take 1 tablet by mouth every morning.          . nitroGLYCERIN (NITROSTAT) 0.4 MG SL tablet   Sublingual   Place 1 tablet (0.4 mg total) under the tongue every 5 (five) minutes as needed for chest pain (up to 3 doses).    25 tablet   3   . pantoprazole (PROTONIX) 40 MG tablet   Oral   Take 1 tablet (40 mg total) by mouth every morning.   30 tablet   3   . potassium chloride SA (K-DUR,KLOR-CON) 20 MEQ tablet   Oral   Take 20 mEq by mouth 2 (two) times daily.         . prasugrel (EFFIENT) 5 MG TABS   Oral   Take 1 tablet (5 mg total) by mouth every morning.   30 tablet   6   . Probiotic Product (TRUBIOTICS) CAPS   Oral   Take 1 capsule by mouth daily.         . Rivaroxaban (XARELTO) 15 MG TABS tablet   Oral   Take 1 tablet (15 mg total) by mouth daily with supper.   30 tablet   11     Prior auth was obtained.   . traZODone (DESYREL) 50 MG tablet   Oral   Take 50 mg by mouth at bedtime.         [name] venlafaxine (EFFEXOR-XR) 75 MG 24 hr capsule   Oral   Take 75 mg by mouth at bedtime. 1 tablets at bedtime         . clotrimazole (MYCELEX) 10 MG troche   Oral   Take 10 mg by mouth 5 (five) times daily as needed (thrush).         [name] PRESCRIPTION MEDICATION   Injection   Inject 80 mg as directed every 30 (thirty) days. Steroidal Injection (FOR LUNGS) received every month since June 2013. Administered by Michelle Piper, MD at [facility]. Due for next injection on June 01, 2012.           BP 110/69  Pulse 75  Temp(Src) 98.3 F (36.8 C) (Oral)  Resp 20  Ht 5\' 6"  (1.676 m)  Wt 181 lb (82.101 kg)  BMI 29.23 kg/m2  SpO2 99%  Vital signs normal    Physical Exam  Nursing note and vitals reviewed. Constitutional: She is oriented to person, place, and time. She appears well-developed and well-nourished.  Non-toxic appearance. She does not appear ill. No distress.  HENT:  Head: Normocephalic and atraumatic.  Right Ear: External ear normal.  Left Ear: External ear normal.  Nose: Nose normal. No mucosal edema or rhinorrhea.  Mouth/Throat: Oropharynx is clear and moist and mucous membranes are normal. No dental abscesses or edematous.  Holding cotton in  her mouth for dental extraction right upper maxilla   Eyes: Conjunctivae and EOM are normal. Pupils are equal, round, and reactive to light.  Neck: Normal range of motion and full passive range of motion without pain. Neck supple.  Cardiovascular: Normal rate, regular rhythm and normal heart sounds.  Exam reveals no gallop and no friction rub.   No murmur heard. Pulmonary/Chest: Effort normal and breath sounds normal. No respiratory distress. She has no wheezes. She has no rhonchi. She has no rales. She exhibits no tenderness and no crepitus.  Abdominal: Soft. Normal appearance and bowel sounds are normal. She exhibits no distension. There is  no tenderness. There is no rebound and no guarding.  Musculoskeletal: Normal range of motion. She exhibits no edema and no tenderness.  Moves all extremities well.   Neurological: She is alert and oriented to person, place, and time. She has normal strength. No cranial nerve deficit.  Skin: Skin is warm, dry and intact. No rash noted. No erythema. No pallor.  Psychiatric: She has a normal mood and affect. Her speech is normal and behavior is normal. Her mood appears not anxious.    ED Course  Procedures (including critical care time)  Medications  nitroGLYCERIN (NITROGLYN) 2 % ointment 1 inch (1 inch Topical Given 12/05/12 1453)  HYDROcodone-acetaminophen (NORCO/VICODIN) 5-325 MG per tablet 1 tablet (1 tablet Oral Given 12/05/12 1607)   Recheck at 1600 to go over her test results. Patient states the nitroglycerin made the pain that she had in her left posterior shoulder go away. She however does have pain in her mouth from her tooth extraction this morning. She states she was prescribed Vicodin by her dentist.  Review of her cardiac catheterization from October 2013 show she had a 90% stenosis in the mid LAD, the remaining LAD was smooth and normal. She had some other lesions that were 20-30% in the proximal right coronary artery and in the first diagonal  of the LAD although there is mention that the vessels were moderately sized. Her ejection fraction was 70%.  Discussed test results. Daughter states when they last saw Dr Elease Hashimoto, several months ago he said if she had further chest pains they would have to consider doing another catheterization on patient.   16:25 The [place] office states the physician has left the office.  16:34 Dr Jones Broom, feels her effient can be stopped, he recommends having her admitted for further rule out and have them see her tomorrow morning. He is on call tonight until 10 pm, he can be called for any problems or questions.  Pt is agreeable to be admitted. She hasn't had any bleeding from her tooth extraction.   17:59 Dr Sherrie Mustache, admit to observation, tele, team 1.   Results for orders placed during the hospital encounter of 12/05/12  CBC WITH DIFFERENTIAL      Result Value Range   WBC 11.5 (*) 4.0 - 10.5 K/uL   RBC 4.28  3.87 - 5.11 MIL/uL   Hemoglobin 12.3  12.0 - 15.0 g/dL   HCT 78.2  95.6 - 21.3 %   MCV 92.1  78.0 - 100.0 fL   MCH 28.7  26.0 - 34.0 pg   MCHC 31.2  30.0 - 36.0 g/dL   RDW 08.6  57.8 - 46.9 %   Platelets 286  150 - 400 K/uL   Neutrophils Relative % 72  43 - 77 %   Neutro Abs 8.4 (*) 1.7 - 7.7 K/uL   Lymphocytes Relative 19  12 - 46 %   Lymphs Abs 2.2  0.7 - 4.0 K/uL   Monocytes Relative 8  3 - 12 %   Monocytes Absolute 1.0  0.1 - 1.0 K/uL   Eosinophils Relative 0  0 - 5 %   Eosinophils Absolute 0.0  0.0 - 0.7 K/uL   Basophils Relative 0  0 - 1 %   Basophils Absolute 0.0  0.0 - 0.1 K/uL  COMPREHENSIVE METABOLIC PANEL      Result Value Range   Sodium 143  135 - 145 mEq/L   Potassium 3.6  3.5 - 5.1 mEq/L   Chloride 102  96 - 112 mEq/L   CO2 32  19 - 32 mEq/L   Glucose, Bld 103 (*) 70 - 99 mg/dL   BUN 16  6 - 23 mg/dL   Creatinine, Ser 1.61  0.50 - 1.10 mg/dL   Calcium 9.4  8.4 - 09.6 mg/dL   Total Protein 7.0  6.0 - 8.3 g/dL   Albumin 3.6  3.5 - 5.2 g/dL   AST 20  0 -  37 U/L   ALT 22  0 - 35 U/L   Alkaline Phosphatase 80  39 - 117 U/L   Total Bilirubin 0.3  0.3 - 1.2 mg/dL   GFR calc non Af Amer 87 (*) >90 mL/min   GFR calc Af Amer >90  >90 mL/min  APTT      Result Value Range   aPTT 27  24 - 37 seconds  PROTIME-INR      Result Value Range   Prothrombin Time 13.2  11.6 - 15.2 seconds   INR 1.01  0.00 - 1.49  POCT I-STAT TROPONIN I      Result Value Range   Troponin i, poc 0.01  0.00 - 0.08 ng/mL   Comment 3           POCT I-STAT TROPONIN I      Result Value Range   Troponin i, poc 0.00  0.00 - 0.08 ng/mL   Comment 3            Laboratory interpretation all normal except mild leukocytosis   US Venous Img Lower Unilateral Left  12/05/2012   *RADIOLOGY REPORT*  Clinical Data: Left lower extremity pain and edema.  History of prior DVT in 2012.  LEFT LOWER EXTREMITY VENOUS DUPLEX ULTRASOUND  Technique:  Gray-scale sonography with graded compression, as well as color Doppler and duplex ultrasound were performed to evaluate the deep venous system of the lower extremity from the level of the common femoral vein through the popliteal and proximal calf veins. Spectral Doppler was utilized to evaluate flow at rest and with distal augmentation maneuvers.  Comparison:  None.  Findings:  Normal compressibility of the common femoral, superficial femoral, and popliteal veins is demonstrated, as well as the visualized proximal calf veins.  No filling defects to suggest DVT on grayscale or color Doppler imaging.  Doppler waveforms show normal direction of venous flow, normal respiratory phasicity and response to augmentation.  There is no evidence of chronic mural thrombus.  No superficial thrombophlebitis or abnormal fluid collection is identified.  IMPRESSION: No evidence of left lower extremity deep vein thrombosis.   Original Report Authenticated By: Irish Lack, M.D.   Dg Chest Portable 1 View  12/05/2012   *RADIOLOGY REPORT*  Clinical Data: Chest pain post  dental procedure with sedation, history asthma, coronary artery disease, hyperlipidemia  PORTABLE CHEST - 1 VIEW  Comparison: Portable exam at approximately 1440 hours compared to 05/23/2012; time signature on new portable radiograph unit is incorrect and is being reset.  Findings: Upper normal heart size. Mediastinal contours and pulmonary vascularity normal. Lungs clear. No pleural effusion or pneumothorax. Bones appear demineralized.  IMPRESSION: No acute abnormalities.   Original Report Authenticated By: Ulyses Southward, M.D.    Date: 12/05/2012  Rate: 75  Rhythm: normal sinus rhythm  QRS Axis: normal  Intervals: normal  ST/T Wave abnormalities: normal  Conduction Disutrbances:none  Narrative Interpretation: low voltage QRS  Old EKG Reviewed: unchanged from 05/23/2012    1. Chest pain at rest     Plan  admission   Devoria Albe, MD, FACEP   MDM          Ward Givens, MD 12/05/12 [phone]

## 2012-12-05 NOTE — H&P (Signed)
[facility] History and Physical  Carrie [name] WGN:[id number] DOB: [birth year] DOA: 12/05/2012  Referring physician: Devoria Albe, M.D. PCP: Catalina Pizza, MD  Specialists: Cardiologists, Dr. Elease Hashimoto  Chief Complaint: Chest pain  HPI: Carrie [name] is a 72 y.o. female with a history significant for coronary artery disease, status post coronary artery stenting in October 2013, pulmonary embolism in October 2013, and chronic asthma, who presents to the emergency department today with a chief complaint of chest pain. Of note, the patient had a tooth extraction earlier today. Approximately one hour following the tooth extraction, the patient went home to lay down. She was suddenly awakened by a sharp left-sided chest pain that radiated to her left shoulder and then to the substernal area. She rated the pain 10 over 10 in intensity. It was associated with shortness of breath, sweatiness, and nausea. She had no vomiting. The pain lasted for a few minutes. Her daughter gave her sublingual nitroglycerin which took her pain from 10 over 10-0/10 within 2-3 minutes. She is currently chest pain-free. In her review of systems, she has had intermittent left lower extremity swelling. She denies heavy lifting. She has chronic coughing and occasional wheezing from chronic asthma for which she uses inhalers and/or bronchodilator nebulizers daily. She denies any pleurisy, fever, or chills. She denies abdominal pain, pain with urination, or syncopal episodes.  In the emergency department, she is noted to be afebrile and hemodynamically stable. Her lab data are significant for a mildly elevated white blood cell count of 11.5, glucose of 103, and troponin I of 0.00. Her EKG reveals normal sinus rhythm with a heart rate of 75 beats per minute and low voltage QRS. Venous ultrasound of her left lower extremity reveals no evidence of DVT. Her chest x-ray reveals no acute abnormalities. She is being admitted for further evaluation  and management.  Of note, ED physician, Dr. Lynelle Doctor, discussed the patient with cardiologist on call at [facility], Dr. Jones Broom. He recommended that the Effient be discontinued, given that her stenting was in October 2013. Xarelto can be continued. He believed that the patient could stay at [facility] for observation.   Review of Systems: As above in history present illness, otherwise negative.  Past Medical History  Diagnosis Date  . Hyperlipidemia   . Anxiety   . Chronic asthma   . Persistent headaches   . PONV (postoperative nausea and vomiting) 1983  . Coronary artery disease      single vessel CAD involving the mid LAD, s/p BMS to mid LAD 03/26/12, nl LV systolic fxn  . Pulmonary embolism     initiated on lifelong coumadin 03/2012   Past Surgical History  Procedure Laterality Date  . Appendectomy    . Tubal ligation    . Cystic surgery on breast      bilateral  . Left ankle surgery    . Shoulder surgery  06/2010    bone spurs  . Coronary stent placement  03/26/2012  . Coronary angioplasty with stent placement    . Breast surgery     Social History: The patient is widowed. She lives in [place]. Her daughter, son-in-law, and granddaughter live with her. She is mostly independent of her ADLs. She denies tobacco, alcohol, and illicit drug use.    Allergies  Allergen Reactions  . Aspirin Hives  . Metoclopramide Hcl     REACTION: intolerant, restless hands  . Morphine Hives  . Adhesive (Tape) Itching and Rash    ONLY TO  USE PAPER TAPE  . Codeine Itching and Rash  . Penicillins Itching and Rash  . Sulfonamide Derivatives Itching and Rash    Family History  Problem Relation Age of Onset  . Heart attack Mother   . Heart attack Father   . Cirrhosis Sister   . Diabetes Sister   . Heart attack Sister   . Heart disease Brother   . Diabetes Brother   . Breast cancer Mother   . Ovarian cancer Sister   . Ovarian cancer Sister   . Colon polyps Mother      Prior to  Admission medications   Medication Sig Start Date End Date Taking? Authorizing Provider  albuterol (PROAIR HFA) 108 (90 BASE) MCG/ACT inhaler Inhale 2 puffs into the lungs every 6 (six) hours as needed. For rescue/shortness of breath   Yes Historical Provider, MD  atorvastatin (LIPITOR) 80 MG tablet Take 1 tablet (80 mg total) by mouth at bedtime. 11/29/12  Yes Vesta Mixer, MD  benzonatate (TESSALON) 100 MG capsule Take 100 mg by mouth 2 (two) times daily.   Yes Historical Provider, MD  bisacodyl (DULCOLAX) 5 MG EC tablet Take 5 mg by mouth daily as needed for constipation.   Yes Historical Provider, MD  budesonide-formoterol (SYMBICORT) 80-4.5 MCG/ACT inhaler Inhale 2 puffs into the lungs daily as needed (shortness of breath).    Yes Historical Provider, MD  docusate sodium (COLACE) 100 MG capsule Take 100 mg by mouth daily.   Yes Historical Provider, MD  donepezil (ARICEPT) 10 MG tablet Take 10 mg by mouth at bedtime as needed.   Yes Historical Provider, MD  Esomeprazole Magnesium (NEXIUM PO) Take 1 capsule by mouth at bedtime.   Yes Historical Provider, MD  ezetimibe (ZETIA) 10 MG tablet Take 10 mg by mouth at bedtime.   Yes Historical Provider, MD  fluticasone (FLONASE) 50 MCG/ACT nasal spray Place 2 sprays into the nose 2 (two) times daily.   Yes Historical Provider, MD  furosemide (LASIX) 40 MG tablet Take 40 mg by mouth daily.   Yes Historical Provider, MD  ipratropium-albuterol (DUONEB) 0.5-2.5 (3) MG/3ML SOLN Take 3 mLs by nebulization every 6 (six) hours as needed. For shortness of breath   Yes Historical Provider, MD  LORazepam (ATIVAN) 1 MG tablet Take 1 mg by mouth 3 (three) times daily as needed. May also take 1 mg at lunchtime if needed   Yes Historical Provider, MD  Magnesium 500 MG CAPS Take 500 mg by mouth every morning.    Yes Historical Provider, MD  Mometasone Furo-Formoterol Fum (DULERA) 200-5 MCG/ACT AERO Inhale 2 puffs into the lungs 3 (three) times daily.   Yes Historical  Provider, MD  montelukast (SINGULAIR) 10 MG tablet Take 10 mg by mouth at bedtime.   Yes Historical Provider, MD  Multiple Vitamin (MULITIVITAMIN WITH MINERALS) TABS Take 1 tablet by mouth every morning.    Yes Historical Provider, MD  nitroGLYCERIN (NITROSTAT) 0.4 MG SL tablet Place 1 tablet (0.4 mg total) under the tongue every 5 (five) minutes as needed for chest pain (up to 3 doses). 03/28/12  Yes Jessica A Hope, PA-C  pantoprazole (PROTONIX) 40 MG tablet Take 1 tablet (40 mg total) by mouth every morning. 11/02/12  Yes Vesta Mixer, MD  potassium chloride SA (K-DUR,KLOR-CON) 20 MEQ tablet Take 20 mEq by mouth 2 (two) times daily.   Yes Historical Provider, MD  prasugrel (EFFIENT) 5 MG TABS Take 1 tablet (5 mg total) by mouth every morning. 08/09/12  Yes Marinus Maw, MD  Probiotic Product (TRUBIOTICS) CAPS Take 1 capsule by mouth daily.   Yes Historical Provider, MD  Rivaroxaban (XARELTO) 15 MG TABS tablet Take 1 tablet (15 mg total) by mouth daily with supper. 08/09/12  Yes Marinus Maw, MD  traZODone (DESYREL) 50 MG tablet Take 50 mg by mouth at bedtime.   Yes Historical Provider, MD  venlafaxine (EFFEXOR-XR) 75 MG 24 hr capsule Take 75 mg by mouth at bedtime. 1 tablets at bedtime 03/04/11  Yes Historical Provider, MD  clotrimazole (MYCELEX) 10 MG troche Take 10 mg by mouth 5 (five) times daily as needed (thrush).    Historical Provider, MD  PRESCRIPTION MEDICATION Inject 80 mg as directed every 30 (thirty) days. Steroidal Injection (FOR LUNGS) received every month since June 2013. Administered by Michelle Piper, MD at [facility]. Due for next injection on June 01, 2012.    Historical Provider, MD   Physical Exam: Filed Vitals:   12/05/12 1405 12/05/12 1700 12/05/12 1846  BP: 110/69 119/57 121/70  Pulse: 75 76 70  Temp: 98.3 F (36.8 C)    TempSrc: Oral    Resp: 20 20 20   Height: 5\' 6"  (1.676 m)    Weight: 82.101 kg (181 lb)    SpO2: 99% 98% 97%      General:  Pleasant 72 year old Caucasian woman sitting up in bed, in no acute distress.  Eyes: Pupils are equal, round, and reactive to light. Extraocular was are intact. Conjunctivae are clear. Sclerae are white.  ENT: Oropharynx reveals recently extracted right upper molar, with scant erythema, but no active bleeding. Mucous membranes are mildly dry. No posterior exudates or pharyngeal erythema.  Neck: Supple, no adenopathy, no thyromegaly, no JVD.  Cardiovascular: S1, S2, with no murmurs rubs or gallops.  Respiratory: Breathing is nonlabored. Occasional wheezes auscultated bilaterally.  Abdomen: Positive bowel sounds, soft, nontender, nondistended.  Skin: Fair turgor. Few varicosities on her lower extremities.  Musculoskeletal: No acute hot red joints. Pedal pulses palpable. Trace pedal edema bilaterally.  Psychiatric: She is alert and oriented x3. Her speech is clear.  Neurologic: Cranial nerves II through XII are intact.  Labs on Admission:  Basic Metabolic Panel:  Recent Labs Lab 12/05/12 1435  NA 143  K 3.6  CL 102  CO2 32  GLUCOSE 103*  BUN 16  CREATININE 0.66  CALCIUM 9.4   Liver Function Tests:  Recent Labs Lab 12/05/12 1435  AST 20  ALT 22  ALKPHOS 80  BILITOT 0.3  PROT 7.0  ALBUMIN 3.6   No results found for this basename: LIPASE, AMYLASE,  in the last 168 hours No results found for this basename: AMMONIA,  in the last 168 hours CBC:  Recent Labs Lab 12/05/12 1435  WBC 11.5*  NEUTROABS 8.4*  HGB 12.3  HCT 39.4  MCV 92.1  PLT 286   Cardiac Enzymes: No results found for this basename: CKTOTAL, CKMB, CKMBINDEX, TROPONINI,  in the last 168 hours  BNP (last 3 results) No results found for this basename: PROBNP,  in the last 8760 hours CBG: No results found for this basename: GLUCAP,  in the last 168 hours  Radiological Exams on Admission: US Venous Img Lower Unilateral Left  12/05/2012   *RADIOLOGY REPORT*  Clinical Data: Left  lower extremity pain and edema.  History of prior DVT in 2012.  LEFT LOWER EXTREMITY VENOUS DUPLEX ULTRASOUND  Technique:  Gray-scale sonography with graded compression, as well as color Doppler and  duplex ultrasound were performed to evaluate the deep venous system of the lower extremity from the level of the common femoral vein through the popliteal and proximal calf veins. Spectral Doppler was utilized to evaluate flow at rest and with distal augmentation maneuvers.  Comparison:  None.  Findings:  Normal compressibility of the common femoral, superficial femoral, and popliteal veins is demonstrated, as well as the visualized proximal calf veins.  No filling defects to suggest DVT on grayscale or color Doppler imaging.  Doppler waveforms show normal direction of venous flow, normal respiratory phasicity and response to augmentation.  There is no evidence of chronic mural thrombus.  No superficial thrombophlebitis or abnormal fluid collection is identified.  IMPRESSION: No evidence of left lower extremity deep vein thrombosis.   Original Report Authenticated By: Irish Lack, M.D.   Dg Chest Portable 1 View  12/05/2012   *RADIOLOGY REPORT*  Clinical Data: Chest pain post dental procedure with sedation, history asthma, coronary artery disease, hyperlipidemia  PORTABLE CHEST - 1 VIEW  Comparison: Portable exam at approximately 1440 hours compared to 05/23/2012; time signature on new portable radiograph unit is incorrect and is being reset.  Findings: Upper normal heart size. Mediastinal contours and pulmonary vascularity normal. Lungs clear. No pleural effusion or pneumothorax. Bones appear demineralized.  IMPRESSION: No acute abnormalities.   Original Report Authenticated By: Ulyses Southward, M.D.    EKG: As above.  Assessment/Plan Principal Problem:   Chest pain Active Problems:   Arteriosclerotic cardiovascular disease (ASCVD)   S/P tooth extraction   Intrinsic asthma, unspecified   Depression    Chronic anxiety   History of pulmonary embolism   1. Chest pain in a patient with a history of coronary artery disease and pulmonary embolism. Her pain was relieved with nitroglycerin which is consistent with anginal pain relieved with nitroglycerin. Her initial troponin I and EKG results are reassuring. There is a low threshold for transferring the patient if her chest pain worsens or if her cardiac enzymes proved to be positive. 2. Status post tooth extraction. No signs of infection. Surprisingly, there is no active bleeding. 3. History of pulmonary embolism. She is on anticoagulation for life. Lower extremity venous ultrasound was negative for DVT. We'll continue Xarelto. 4. Chronic asthma. Will continue chronic bronchodilator therapy. 5.    Mild leukocytosis. Likely reactive. This will be followed.    Plan: 1. Continue nitroglycerin paste and a half an inch every 6 hours. Continue analgesic treatment with as needed hydrocodone. 2. Continue most of not all for chronic medications with the exception of Effient which will be discontinued per the recommendation of cardiology. 3. Start gentle IV fluids. 4. For further evaluation, we'll order cardiac enzymes, TSH, and a followup EKG in the morning. 5. Consult [place] cardiology in the morning.    Code Status: Full code Family Communication: Discussed with her daughter. Disposition Plan: Anticipate discharge to home if she rules out in the morning.  Time spent: One hour.  [facility] Pager [phone]  If 7PM-7AM, please contact night-coverage [email or web address] Password [facility] 12/05/2012, 6:57 PM

## 2012-12-05 NOTE — ED Notes (Signed)
Per ems, pt had dental surgery this a.m w/out any difficulties but once she returned home began having some chest pain.  Pt reports the pain was sharp and almost took her breath.  Pt reports some sob as well.  Pt reports taking  Nitroglycerin tablet with relief.  Pt denies any cp upon arrival.

## 2012-12-06 ENCOUNTER — Ambulatory Visit (HOSPITAL_COMMUNITY): Admit: 2012-12-06 | Payer: Self-pay | Admitting: Cardiology

## 2012-12-06 ENCOUNTER — Encounter (HOSPITAL_COMMUNITY)
Admission: AD | Disposition: A | Discharge status: Home / Self Care | Payer: Self-pay | Source: Home / Self Care | Attending: Emergency Medicine

## 2012-12-06 ENCOUNTER — Encounter (HOSPITAL_COMMUNITY): Payer: Self-pay | Admitting: Cardiology

## 2012-12-06 DIAGNOSIS — Z7901 Long term (current) use of anticoagulants: Secondary | ICD-10-CM

## 2012-12-06 LAB — CBC
Hemoglobin: 13.1 g/dL (ref 12.0–15.0)
MCH: 28.3 pg (ref 26.0–34.0)
MCH: 28.6 pg (ref 26.0–34.0)
MCHC: 31.6 g/dL (ref 30.0–36.0)
MCV: 91.8 fL (ref 78.0–100.0)
MCV: 90.4 fL (ref 78.0–100.0)
Platelets: 276 10*3/uL (ref 150–400)
RDW: 13.5 % (ref 11.5–15.5)

## 2012-12-06 LAB — BASIC METABOLIC PANEL
CO2: 32 mEq/L (ref 19–32)
Calcium: 9 mg/dL (ref 8.4–10.5)
Creatinine, Ser: 0.67 mg/dL (ref 0.50–1.10)
Glucose, Bld: 112 mg/dL — ABNORMAL HIGH (ref 70–99)

## 2012-12-06 LAB — TROPONIN I: Troponin I: <0.3 ng/mL (ref <0.30)

## 2012-12-06 LAB — CREATININE, SERUM: Creatinine, Ser: 0.7 mg/dL (ref 0.50–1.10)

## 2012-12-06 LAB — TSH: TSH: 1.791 u[IU]/mL (ref 0.350–4.500)

## 2012-12-06 SURGERY — LEFT HEART CATHETERIZATION WITH CORONARY ANGIOGRAM
Anesthesia: LOCAL

## 2012-12-06 MED ORDER — NITROGLYCERIN 0.4 MG SL SUBL: 0.4000 mg | SUBLINGUAL_TABLET | SUBLINGUAL | Status: DC | PRN

## 2012-12-06 MED ORDER — MOMETASONE FURO-FORMOTEROL FUM 200-5 MCG/ACT IN AERO
2.0000 | INHALATION_SPRAY | Freq: Every day | RESPIRATORY_TRACT | Status: DC
  Administered 2012-12-06 – 2012-12-07 (×2): 2 via RESPIRATORY_TRACT
  Filled 2012-12-06 (×2): qty 8.8

## 2012-12-06 MED ORDER — HEPARIN SODIUM (PORCINE) 5000 UNIT/ML IJ SOLN
5000.0000 [IU] | Freq: Three times a day (TID) | INTRAMUSCULAR | Status: DC
  Administered 2012-12-06 – 2012-12-07 (×2): 5000 [IU] via SUBCUTANEOUS
  Filled 2012-12-06 (×5): qty 1

## 2012-12-06 NOTE — Progress Notes (Signed)
UR Chart Review Completed  

## 2012-12-06 NOTE — Care Management Note (Signed)
    Page 1 of 1   12/06/2012     11:44:25 AM   CARE MANAGEMENT NOTE 12/06/2012  Patient:  Carrie [name], Carrie [name]   Account Number:  [id number]  Date Initiated:  12/06/2012  Documentation initiated by:  Rosemary Holms  Subjective/Objective Assessment:   Pt admitted from home. Her daughter and family lives with pt and assists. Home is" Diability friendly". Will return to home. No needs identified or anticipated     Action/Plan:   Anticipated DC Date:  12/06/2012   Anticipated DC Plan:  HOME/SELF CARE      DC Planning Services  CM consult      Choice offered to / List presented to:             Status of service:  Completed, signed off Medicare Important Message given?   (If response is "NO", the following Medicare IM given date fields will be blank) Date Medicare IM given:   Date Additional Medicare IM given:    Discharge Disposition:    Per UR Regulation:    If discussed at Long Length of Stay Meetings, dates discussed:    Comments:  12/06/12 Rosemary Holms RN BSN CM

## 2012-12-06 NOTE — Progress Notes (Signed)
   S: Pt arrived from [facility] for cath today however this was unable to be completed 2/2 an urgent case.  No chest pain or dyspnea.  Admitting to tele with plan for cath tomorrow. O:   Filed Vitals:   12/06/12 1809  BP: 139/84  Pulse: 81  Temp: 98.6 F (37 C)  Resp: 18  pleasant, nad, aaox3.  Lungs cta, cor rrr.  A/P: 1.  [place]:  Pain free.   For cath tomorrow.  Chronic, low-dose effient currently on hold in setting of recent dental surgery.  2.  H/o PE:  xarelto on hold pending cath.  Nicolasa Ducking, NP

## 2012-12-06 NOTE — Progress Notes (Signed)
Gave report to Pollyann Samples, RN with [facility].  Verbalized understanding. Pt transferred to [facility] via [facility].  Schonewitz, Candelaria Stagers 12/06/2012

## 2012-12-06 NOTE — Consult Note (Addendum)
Patient Name: Carrie [name]  MRN: [id number]  HPI: Carrie [name] is an 72 y.o. female referred for consultation by Dr.Denise Fisher, MD for evaluation of chest pain. This nice woman has known coronary disease having undergone BMS placement for a critical LAD lesion in 03/2012. Pulmonary embolism was diagnosed during that admission as well.  Since she is intolerant to aspirin, she's been maintained on low-dose Effient.  Anticoagulation was variable with warfarin prompting use of rivaroxaban. A low dose was utilized despite normal estimated GFR. She's had problems with bruising, but not with bleeding. She underwent an uncomplicated dental extraction on the day of admission. A few hours thereafter, with a very acceptable level of oozing from the extraction site, she attempted to sit up and noted severe sharp chest pain with associated dyspnea, diaphoresis and nausea. There was no emesis. She took nitroglycerin, and symptoms persisted only a few minutes. In the emergency department, she was free of chest discomfort and had a normal EKG and cardiac markers.  Past Medical History  Diagnosis Date  . Hyperlipidemia   . Anxiety   . Chronic asthma   . Persistent headaches   . PONV (postoperative nausea and vomiting) 1983  . Coronary artery disease      single vessel CAD involving the mid LAD, s/p BMS to mid LAD 03/26/12, nl LV systolic fxn  . Pulmonary embolism     initiated on lifelong coumadin 03/2012   Past Surgical History  Procedure Laterality Date  . Appendectomy    . Tubal ligation    . Cystic surgery on breast      bilateral  . Left ankle surgery    . Shoulder surgery  06/2010    bone spurs  . Coronary stent placement  03/26/2012  . Coronary angioplasty with stent placement    . Breast surgery     Family History  Problem Relation Age of Onset  . Heart attack Mother   . Heart attack Father   . Cirrhosis Sister   . Diabetes Sister   . Heart attack Sister   . Heart disease Brother    . Diabetes Brother   . Breast cancer Mother   . Ovarian cancer Sister   . Ovarian cancer Sister   . Colon polyps Mother    Social History:  reports that she has never smoked. She does not have any smokeless tobacco history on file. She reports that she does not drink alcohol or use illicit drugs.  Allergies:  Allergies  Allergen Reactions  . Aspirin Hives  . Metoclopramide Hcl     REACTION: intolerant, restless hands  . Morphine Hives  . Adhesive (Tape) Itching and Rash    ONLY TO USE PAPER TAPE  . Codeine Itching and Rash  . Penicillins Itching and Rash  . Sulfonamide Derivatives Itching and Rash   Medications:  I have reviewed the patient's current medications. Scheduled: . acidophilus  1 capsule Oral Daily  . albuterol  2.5 mg Nebulization Q6H  . atorvastatin  80 mg Oral QHS  . benzonatate  100 mg Oral BID  . ezetimibe  10 mg Oral QHS  . fluticasone  2 spray Each Nare Daily  . furosemide  40 mg Oral Daily  . ipratropium  0.5 mg Nebulization Q6H  . LORazepam  1 mg Oral TID  . [START ON 12/07/2012] mometasone-formoterol  2 puff Inhalation Daily  . montelukast  10 mg Oral QHS  . multivitamin with minerals  1 tablet Oral q morning -   10a  . pantoprazole  40 mg Oral q morning - 10a  . potassium chloride SA  20 mEq Oral BID  . rivaroxaban  15 mg Oral Q supper  . senna  1 tablet Oral BID  . traZODone  50 mg Oral QHS  . venlafaxine XR  75 mg Oral QHS   Results for orders placed during the hospital encounter of 12/05/12 (from the past 48 hour(s))  CBC WITH DIFFERENTIAL     Status: Abnormal   Collection Time    12/05/12  2:35 PM      Result Value Range   WBC 11.5 (*) 4.0 - 10.5 K/uL   RBC 4.28  3.87 - 5.11 MIL/uL   Hemoglobin 12.3  12.0 - 15.0 g/dL   HCT 39.4  36.0 - 46.0 %   MCV 92.1  78.0 - 100.0 fL   MCH 28.7  26.0 - 34.0 pg   MCHC 31.2  30.0 - 36.0 g/dL   RDW 13.5  11.5 - 15.5 %   Platelets 286  150 - 400 K/uL  COMPREHENSIVE METABOLIC PANEL     Status: Abnormal     12/05/12  2:35 PM      Result Value Range   Sodium 143  135 - 145 mEq/L   Potassium 3.6  3.5 - 5.1 mEq/L   Chloride 102  96 - 112 mEq/L   CO2 32  19 - 32 mEq/L   Glucose, Bld 103 (*) 70 - 99 mg/dL   BUN 16  6 - 23 mg/dL   Creatinine, Ser 0.66  0.50 - 1.10 mg/dL   Calcium 9.4  8.4 - 10.5 mg/dL   Total Protein 7.0  6.0 - 8.3 g/dL   Albumin 3.6  3.5 - 5.2 g/dL   AST 20  0 - 37 U/L   ALT 22  0 - 35 U/L   Alkaline Phosphatase 80  39 - 117 U/L   Total Bilirubin 0.3  0.3 - 1.2 mg/dL   GFR calc non Af Amer 87 (*) >90 mL/min   GFR calc Af Amer >90  >90 mL/min   aPTT 27  24 - 37 seconds   Prothrombin Time 13.2  11.6 - 15.2 seconds   INR 1.01  0.00 - 1.49   Troponin i, poc 0.01  0.00 - 0.08 ng/mL   Troponin i, poc 0.00  0.00 - 0.08 ng/mL   Troponin I <0.30  <0.30 ng/mL  BASIC METABOLIC PANEL     Status: Abnormal   Collection Time    12/06/12  6:06 AM      Result Value Range   Sodium 143  135 - 145 mEq/L   Potassium 4.4  3.5 - 5.1 mEq/L   Comment: DELTA CHECK NOTED   Chloride 105  96 - 112 mEq/L   CO2 32  19 - 32 mEq/L   Glucose, Bld 112 (*) 70 - 99 mg/dL   BUN 13  6 - 23 mg/dL   Creatinine, Ser 0.67  0.50 - 1.10 mg/dL   Calcium 9.0  8.4 - 10.5 mg/dL   GFR calc non Af Amer 86 (*) >90 mL/min   GFR calc Af Amer >90  >90 mL/min  CBC     Status: Abnormal    12/06/12  6:06 AM      Result Value Range   WBC 9.2  4.0 - 10.5 K/uL   RBC 4.00  3.87 - 5.11 MIL/uL   Hemoglobin 11.3 (*) 12.0 - 15.0 g/dL     HCT 36.7  36.0 - 46.0 %   MCV 91.8  78.0 - 100.0 fL   MCH 28.3  26.0 - 34.0 pg   MCHC 30.8  30.0 - 36.0 g/dL   RDW 13.5  11.5 - 15.5 %   Platelets 276  150 - 400 K/uL   Us Venous Img Lower Unilateral Left  12/05/2012  LEFT LOWER EXTREMITY VENOUS DUPLEX ULTRASOUND:  No DVT Dg Chest Portable 1 View  12/05/2012  PORTABLE CHEST - 1 VIEW  Comparison:   Findings: Upper normal heart size. Mediastinal contours and pulmonary vascularity normal. Lungs clear. No pleural effusion or  pneumothorax. Bones appear demineralized.  IMPRESSION: No acute abnormalities.    Review of Systems: General: no anorexia, weight gain or weight loss Cardiac: no orthopnea, PND,  or syncope Respiratory: + cough, no sputum production or hemoptysis; chronic class III dyspnea on exertion; intermittent wheezing GI: H/o abdominal pain without specific diagnosis; no emesis, diarrhea or constipation Integument: no significant lesions Neurologic: No muscle weakness or paralysis; no speech disturbance; no headache All other systems reviewed and are negative.  Physical Exam: Blood pressure 115/64, pulse 64, temperature 97.6 F (36.4 C), temperature source Oral, resp. rate 20, height 5' 6" (1.676 m), weight 87 kg (191 lb 12.8 oz), SpO2 97.00%.;  Body mass index is 30.97 kg/(m^2). General-Well-developed; no acute distress; mildly to moderately overweight HEENT-[place]/AT; PERRL; EOM intact; conjunctiva and lids nl; mild thyromegaly. Neck-No JVD; no carotid bruits Endocrine-No thyromegaly Lungs-decreased breath sounds; resonant percussion; prolonged expiratory phase without wheezing or rhonchi; increased AP diameter Cardiovascular- normal PMI; normal S1 and S2; S4 present Abdomen-BS normal; soft and non-tender without masses or organomegaly Musculoskeletal-No deformities, cyanosis or clubbing Neurologic-Nl cranial nerves; symmetric strength and tone Skin- Warm, no significant lesions Extremities-distal pulses intact; 1+ ankle and pretibial edema bilaterally  Assessment/Plan:  Chest pain: Description of symptoms certainly compatible with myocardial ischemia; however, cardiac markers and EKGs normal. Chest discomfort has been chronic and was not relieved following PCI for single vessel disease. Case discussed with Dr. Philip Nahser who favors proceeding directly to repeat coronary angiography to determine if stent remains patent. Risks and benefits explained to patient. She agrees to transfer to [facility] for cardiac catheterization to be performed today. Patient's treatment with both full anticoagulation and a potent antiplatelet agent discussed with Dr. Jordan. She has not taken Plavix for nearly 48 hours nor rivaroxaban for the past 30 hours. Plan is to proceed with catheterization today to be performed from the radial artery.  Peripheral edema: Likely secondary to venous insufficiency. Patient is already limiting salt intake, utilizing compression stockings and maintaining leg elevation when possible. At this point, stasis is mild, and there does not appear to be in the skin jeopardy. If condition worsens, consultation with a vascular expert may be useful.  Chronic anticoagulation: Rivaroxaban in Effient held for the time being, but should be continued at the time of hospital discharge.  Annalissa Murphey, MD 12/06/2012, 12:57 PM        

## 2012-12-06 NOTE — Progress Notes (Signed)
Subjective: The patient denies chest pain or shortness of breath. Overall, she feels better. Her daughter is in the room. She is wondering if the patient had a "panic attack" yesterday.  Objective: Vital signs in last 24 hours: Filed Vitals:   12/05/12 2038 12/06/12 0209 12/06/12 0434 12/06/12 0659  BP:   115/64   Pulse:   64   Temp:   97.6 F (36.4 C)   TempSrc:   Oral   Resp:   20   Height:      Weight:   87 kg (191 lb 12.8 oz)   SpO2: 95% 95% 96% 97%    Intake/Output Summary (Last 24 hours) at 12/06/12 1237 Last data filed at 12/05/12 2144  Gross per 24 hour  Intake    240 ml  Output      1 ml  Net    239 ml    Weight change:   Physical exam:  General: Pleasant alert 72 year old woman sitting up in bed, in no acute distress. Lungs: Clear to auscultation bilaterally. Heart: S1, S2, with no murmurs rubs or gallops. Abdomen: Positive bowel sounds, soft, nontender, nondistended. Extremities: Trace of pedal edema bilaterally. Neurologic: She is alert and oriented x3. Cranial nerves II through XII are intact. Psychiatric: She is slightly anxious but not tremulous. Her speech is clear.  Lab Results: Basic Metabolic Panel:  Recent Labs  45/40/98 1435 12/06/12 0606  NA 143 143  K 3.6 4.4  CL 102 105  CO2 32 32  GLUCOSE 103* 112*  BUN 16 13  CREATININE 0.66 0.67  CALCIUM 9.4 9.0   Liver Function Tests:  Recent Labs  12/05/12 1435  AST 20  ALT 22  ALKPHOS 80  BILITOT 0.3  PROT 7.0  ALBUMIN 3.6   No results found for this basename: LIPASE, AMYLASE,  in the last 72 hours No results found for this basename: AMMONIA,  in the last 72 hours CBC:  Recent Labs  12/05/12 1435 12/06/12 0606  WBC 11.5* 9.2  NEUTROABS 8.4*  --   HGB 12.3 11.3*  HCT 39.4 36.7  MCV 92.1 91.8  PLT 286 276   Cardiac Enzymes:  Recent Labs  12/05/12 1914 12/06/12 0032 12/06/12 0609  TROPONINI <0.30 <0.30 <0.30   BNP: No results found for this basename: PROBNP,  in the  last 72 hours D-Dimer: No results found for this basename: DDIMER,  in the last 72 hours CBG: No results found for this basename: GLUCAP,  in the last 72 hours Hemoglobin A1C: No results found for this basename: HGBA1C,  in the last 72 hours Fasting Lipid Panel: No results found for this basename: CHOL, HDL, LDLCALC, TRIG, CHOLHDL, LDLDIRECT,  in the last 72 hours Thyroid Function Tests: No results found for this basename: TSH, T4TOTAL, FREET4, T3FREE, THYROIDAB,  in the last 72 hours Anemia Panel: No results found for this basename: VITAMINB12, FOLATE, FERRITIN, TIBC, IRON, RETICCTPCT,  in the last 72 hours Coagulation:  Recent Labs  12/05/12 1435  LABPROT 13.2  INR 1.01   Urine Drug Screen: Drugs of Abuse  No results found for this basename: labopia, cocainscrnur, labbenz, amphetmu, thcu, labbarb    Alcohol Level: No results found for this basename: ETH,  in the last 72 hours Urinalysis: No results found for this basename: COLORURINE, APPERANCEUR, LABSPEC, PHURINE, GLUCOSEU, HGBUR, BILIRUBINUR, KETONESUR, PROTEINUR, UROBILINOGEN, NITRITE, LEUKOCYTESUR,  in the last 72 hours Misc. Labs:   Micro: No results found for this or any previous visit (from the  past 240 hour(s)).  Studies/Results: US Venous Img Lower Unilateral Left  12/05/2012   *RADIOLOGY REPORT*  Clinical Data: Left lower extremity pain and edema.  History of prior DVT in 2012.  LEFT LOWER EXTREMITY VENOUS DUPLEX ULTRASOUND  Technique:  Gray-scale sonography with graded compression, as well as color Doppler and duplex ultrasound were performed to evaluate the deep venous system of the lower extremity from the level of the common femoral vein through the popliteal and proximal calf veins. Spectral Doppler was utilized to evaluate flow at rest and with distal augmentation maneuvers.  Comparison:  None.  Findings:  Normal compressibility of the common femoral, superficial femoral, and popliteal veins is demonstrated, as  well as the visualized proximal calf veins.  No filling defects to suggest DVT on grayscale or color Doppler imaging.  Doppler waveforms show normal direction of venous flow, normal respiratory phasicity and response to augmentation.  There is no evidence of chronic mural thrombus.  No superficial thrombophlebitis or abnormal fluid collection is identified.  IMPRESSION: No evidence of left lower extremity deep vein thrombosis.   Original Report Authenticated By: Irish Lack, M.D.   Dg Chest Portable 1 View  12/05/2012   *RADIOLOGY REPORT*  Clinical Data: Chest pain post dental procedure with sedation, history asthma, coronary artery disease, hyperlipidemia  PORTABLE CHEST - 1 VIEW  Comparison: Portable exam at approximately 1440 hours compared to 05/23/2012; time signature on new portable radiograph unit is incorrect and is being reset.  Findings: Upper normal heart size. Mediastinal contours and pulmonary vascularity normal. Lungs clear. No pleural effusion or pneumothorax. Bones appear demineralized.  IMPRESSION: No acute abnormalities.   Original Report Authenticated By: Ulyses Southward, M.D.    Medications:  Scheduled: . acidophilus  1 capsule Oral Daily  . albuterol  2.5 mg Nebulization Q6H  . atorvastatin  80 mg Oral QHS  . benzonatate  100 mg Oral BID  . ezetimibe  10 mg Oral QHS  . fluticasone  2 spray Each Nare Daily  . furosemide  40 mg Oral Daily  . ipratropium  0.5 mg Nebulization Q6H  . LORazepam  1 mg Oral TID  . [START ON 12/07/2012] mometasone-formoterol  2 puff Inhalation Daily  . montelukast  10 mg Oral QHS  . multivitamin with minerals  1 tablet Oral q morning - 10a  . pantoprazole  40 mg Oral q morning - 10a  . potassium chloride SA  20 mEq Oral BID  . rivaroxaban  15 mg Oral Q supper  . senna  1 tablet Oral BID  . traZODone  50 mg Oral QHS  . venlafaxine XR  75 mg Oral QHS   Continuous: . 0.9 % NaCl with KCl 20 mEq / L 50 mL/hr at 12/05/12 2104   UJW:JXBJYNWGNFAOZ,  acetaminophen, albuterol, alum & mag hydroxide-simeth, donepezil, guaiFENesin-dextromethorphan, HYDROcodone-acetaminophen, ipratropium, ondansetron (ZOFRAN) IV, ondansetron  Assessment: Principal Problem:   Chest pain Active Problems:   Arteriosclerotic cardiovascular disease (ASCVD)   S/P tooth extraction   Intrinsic asthma, unspecified   Depression   Chronic anxiety   History of pulmonary embolism   The patient has ruled out for myocardial infarction. She is currently hemodynamically stable and chest pain-free. She does have chronic anxiety which may or may not have been the impetus for her symptomatology yesterday. She is being continued on her psychotropic medications as previously prescribed. As discussed the patient with cardiologist, Dr. Dietrich Pates. He recommended a stress test, but he discussed this with her primary cardiologist, Dr. Elease Hashimoto. Dr.  Nahserr favors a cardiac catheterization for a definitive diagnosis rather than a stress test. Therefore, the patient is being transferred to [facility] for a cardiac catheterization today. Of note, the cardiologist on call last night recommended that Effient be discontinued because the patient was greater than 6 months out from the previous cardiac catheterization, however, her daughter states that the patient was told by Dr. Elease Hashimoto to continue taking it because she was allergic to aspirin. She did not receive Effient during this hospitalization, but she did receive the Xarelto last night. It is not due again until 5:00 this afternoon. Antiplatelet therapy and anticoagulation therapy should be continued per the protocol by cardiology following the heart catheterization.  Plan:  1. The patient is being transferred to cardiology at [facility] per arrangements by Dr. Dietrich Pates.    LOS: 1 day   Breezie Micucci 12/06/2012, 12:37 PM

## 2012-12-06 NOTE — Progress Notes (Signed)
Called report to Victorino Dike, Charity fundraiser at [facility].  Verbalized understanding.  Pt transferred to facility via [facility]. [place], Candelaria Stagers 12/06/2012

## 2012-12-07 ENCOUNTER — Encounter (HOSPITAL_COMMUNITY)
Admission: AD | Disposition: A | Discharge status: Home / Self Care | Payer: Self-pay | Source: Home / Self Care | Attending: Emergency Medicine

## 2012-12-07 DIAGNOSIS — I2510 Atherosclerotic heart disease of native coronary artery without angina pectoris: Secondary | ICD-10-CM

## 2012-12-07 HISTORY — PX: LEFT HEART CATHETERIZATION WITH CORONARY ANGIOGRAM: SHX5451

## 2012-12-07 SURGERY — LEFT HEART CATHETERIZATION WITH CORONARY ANGIOGRAM
Anesthesia: LOCAL

## 2012-12-07 MED ORDER — FENTANYL CITRATE 0.05 MG/ML IJ SOLN
INTRAMUSCULAR | Status: AC
  Filled 2012-12-07: qty 2

## 2012-12-07 MED ORDER — LIDOCAINE HCL (PF) 1 % IJ SOLN
INTRAMUSCULAR | Status: AC
  Filled 2012-12-07: qty 30

## 2012-12-07 MED ORDER — SODIUM CHLORIDE 0.9 % IV SOLN: 1.0000 mL/kg/h | INTRAVENOUS | Status: DC

## 2012-12-07 MED ORDER — NITROGLYCERIN 0.2 MG/ML ON CALL CATH LAB
INTRAVENOUS | Status: AC
  Filled 2012-12-07: qty 1

## 2012-12-07 MED ORDER — PRASUGREL HCL 5 MG PO TABS
5.0000 mg | ORAL_TABLET | Freq: Every morning | ORAL | Status: DC
  Administered 2012-12-07: 5 mg via ORAL
  Filled 2012-12-07: qty 1

## 2012-12-07 MED ORDER — HEPARIN (PORCINE) IN NACL 2-0.9 UNIT/ML-% IJ SOLN
INTRAMUSCULAR | Status: AC
  Filled 2012-12-07: qty 1000

## 2012-12-07 MED ORDER — MIDAZOLAM HCL 2 MG/2ML IJ SOLN
INTRAMUSCULAR | Status: AC
  Filled 2012-12-07: qty 2

## 2012-12-07 MED ORDER — ALBUTEROL SULFATE (5 MG/ML) 0.5% IN NEBU
2.5000 mg | INHALATION_SOLUTION | Freq: Four times a day (QID) | RESPIRATORY_TRACT | Status: DC
  Administered 2012-12-07 (×2): 2.5 mg via RESPIRATORY_TRACT
  Filled 2012-12-07 (×3): qty 0.5

## 2012-12-07 MED ORDER — IPRATROPIUM BROMIDE 0.02 % IN SOLN
0.5000 mg | Freq: Four times a day (QID) | RESPIRATORY_TRACT | Status: DC
  Administered 2012-12-07 (×2): 0.5 mg via RESPIRATORY_TRACT
  Filled 2012-12-07 (×3): qty 2.5

## 2012-12-07 NOTE — Progress Notes (Signed)
Pt discharged to home per MD order. Pt and daughter received and reviewed all discharge instructions and medication information including follow-up appointments and prescriptions. Pt and family verbalized understanding. Pt alert and oriented at discharge with no complaints of pain. Pt escorted to private vehicle via wheelchair by nurse tech. Carrie [name]

## 2012-12-07 NOTE — Interval H&P Note (Signed)
History and Physical Interval Note:  12/07/2012 9:01 AM  Carrie [name]  has presented today for surgery, with the diagnosis of c/p  The various methods of treatment have been discussed with the patient and family. After consideration of risks, benefits and other options for treatment, the patient has consented to  Procedure(s): LEFT HEART CATHETERIZATION WITH CORONARY ANGIOGRAM (N/A) as a surgical intervention .  The patient's history has been reviewed, patient examined, no change in status, stable for surgery.  I have reviewed the patient's chart and labs.  Questions were answered to the patient's satisfaction.     Theron Arista [facility] 12/07/2012 9:01 AM

## 2012-12-07 NOTE — H&P (View-Only) (Signed)
Patient Name: Carrie [name]  MRN: [id number]  HPI: SHAKENDRA GRIFFETH is an 72 y.o. female referred for consultation by Dr.Denise Sherrie Mustache, MD for evaluation of chest pain. This nice woman has known coronary disease having undergone BMS placement for a critical LAD lesion in 03/2012. Pulmonary embolism was diagnosed during that admission as well.  Since she is intolerant to aspirin, she's been maintained on low-dose Effient.  Anticoagulation was variable with warfarin prompting use of rivaroxaban. A low dose was utilized despite normal estimated GFR. She's had problems with bruising, but not with bleeding. She underwent an uncomplicated dental extraction on the day of admission. A few hours thereafter, with a very acceptable level of oozing from the extraction site, she attempted to sit up and noted severe sharp chest pain with associated dyspnea, diaphoresis and nausea. There was no emesis. She took nitroglycerin, and symptoms persisted only a few minutes. In the emergency department, she was free of chest discomfort and had a normal EKG and cardiac markers.  Past Medical History  Diagnosis Date  . Hyperlipidemia   . Anxiety   . Chronic asthma   . Persistent headaches   . PONV (postoperative nausea and vomiting) 1983  . Coronary artery disease      single vessel CAD involving the mid LAD, s/p BMS to mid LAD 03/26/12, nl LV systolic fxn  . Pulmonary embolism     initiated on lifelong coumadin 03/2012   Past Surgical History  Procedure Laterality Date  . Appendectomy    . Tubal ligation    . Cystic surgery on breast      bilateral  . Left ankle surgery    . Shoulder surgery  06/2010    bone spurs  . Coronary stent placement  03/26/2012  . Coronary angioplasty with stent placement    . Breast surgery     Family History  Problem Relation Age of Onset  . Heart attack Mother   . Heart attack Father   . Cirrhosis Sister   . Diabetes Sister   . Heart attack Sister   . Heart disease Brother    . Diabetes Brother   . Breast cancer Mother   . Ovarian cancer Sister   . Ovarian cancer Sister   . Colon polyps Mother    Social History:  reports that she has never smoked. She does not have any smokeless tobacco history on file. She reports that she does not drink alcohol or use illicit drugs.  Allergies:  Allergies  Allergen Reactions  . Aspirin Hives  . Metoclopramide Hcl     REACTION: intolerant, restless hands  . Morphine Hives  . Adhesive (Tape) Itching and Rash    ONLY TO USE PAPER TAPE  . Codeine Itching and Rash  . Penicillins Itching and Rash  . Sulfonamide Derivatives Itching and Rash   Medications:  I have reviewed the patient's current medications. Scheduled: . acidophilus  1 capsule Oral Daily  . albuterol  2.5 mg Nebulization Q6H  . atorvastatin  80 mg Oral QHS  . benzonatate  100 mg Oral BID  . ezetimibe  10 mg Oral QHS  . fluticasone  2 spray Each Nare Daily  . furosemide  40 mg Oral Daily  . ipratropium  0.5 mg Nebulization Q6H  . LORazepam  1 mg Oral TID  . [START ON 12/07/2012] mometasone-formoterol  2 puff Inhalation Daily  . montelukast  10 mg Oral QHS  . multivitamin with minerals  1 tablet Oral q morning -  10a  . pantoprazole  40 mg Oral q morning - 10a  . potassium chloride SA  20 mEq Oral BID  . rivaroxaban  15 mg Oral Q supper  . senna  1 tablet Oral BID  . traZODone  50 mg Oral QHS  . venlafaxine XR  75 mg Oral QHS   Results for orders placed during the hospital encounter of 12/05/12 (from the past 48 hour(s))  CBC WITH DIFFERENTIAL     Status: Abnormal   Collection Time    12/05/12  2:35 PM      Result Value Range   WBC 11.5 (*) 4.0 - 10.5 K/uL   RBC 4.28  3.87 - 5.11 MIL/uL   Hemoglobin 12.3  12.0 - 15.0 g/dL   HCT 40.9  81.1 - 91.4 %   MCV 92.1  78.0 - 100.0 fL   MCH 28.7  26.0 - 34.0 pg   MCHC 31.2  30.0 - 36.0 g/dL   RDW 78.2  95.6 - 21.3 %   Platelets 286  150 - 400 K/uL  COMPREHENSIVE METABOLIC PANEL     Status: Abnormal     12/05/12  2:35 PM      Result Value Range   Sodium 143  135 - 145 mEq/L   Potassium 3.6  3.5 - 5.1 mEq/L   Chloride 102  96 - 112 mEq/L   CO2 32  19 - 32 mEq/L   Glucose, Bld 103 (*) 70 - 99 mg/dL   BUN 16  6 - 23 mg/dL   Creatinine, Ser 0.86  0.50 - 1.10 mg/dL   Calcium 9.4  8.4 - 57.8 mg/dL   Total Protein 7.0  6.0 - 8.3 g/dL   Albumin 3.6  3.5 - 5.2 g/dL   AST 20  0 - 37 U/L   ALT 22  0 - 35 U/L   Alkaline Phosphatase 80  39 - 117 U/L   Total Bilirubin 0.3  0.3 - 1.2 mg/dL   GFR calc non Af Amer 87 (*) >90 mL/min   GFR calc Af Amer >90  >90 mL/min   aPTT 27  24 - 37 seconds   Prothrombin Time 13.2  11.6 - 15.2 seconds   INR 1.01  0.00 - 1.49   Troponin i, poc 0.01  0.00 - 0.08 ng/mL   Troponin i, poc 0.00  0.00 - 0.08 ng/mL   Troponin I <0.30  <0.30 ng/mL  BASIC METABOLIC PANEL     Status: Abnormal   Collection Time    12/06/12  6:06 AM      Result Value Range   Sodium 143  135 - 145 mEq/L   Potassium 4.4  3.5 - 5.1 mEq/L   Comment: DELTA CHECK NOTED   Chloride 105  96 - 112 mEq/L   CO2 32  19 - 32 mEq/L   Glucose, Bld 112 (*) 70 - 99 mg/dL   BUN 13  6 - 23 mg/dL   Creatinine, Ser 4.69  0.50 - 1.10 mg/dL   Calcium 9.0  8.4 - 62.9 mg/dL   GFR calc non Af Amer 86 (*) >90 mL/min   GFR calc Af Amer >90  >90 mL/min  CBC     Status: Abnormal    12/06/12  6:06 AM      Result Value Range   WBC 9.2  4.0 - 10.5 K/uL   RBC 4.00  3.87 - 5.11 MIL/uL   Hemoglobin 11.3 (*) 12.0 - 15.0 g/dL  HCT 36.7  36.0 - 46.0 %   MCV 91.8  78.0 - 100.0 fL   MCH 28.3  26.0 - 34.0 pg   MCHC 30.8  30.0 - 36.0 g/dL   RDW 54.0  98.1 - 19.1 %   Platelets 276  150 - 400 K/uL   US Venous Img Lower Unilateral Left  12/05/2012  LEFT LOWER EXTREMITY VENOUS DUPLEX ULTRASOUND:  No DVT Dg Chest Portable 1 View  12/05/2012  PORTABLE CHEST - 1 VIEW  Comparison:   Findings: Upper normal heart size. Mediastinal contours and pulmonary vascularity normal. Lungs clear. No pleural effusion or  pneumothorax. Bones appear demineralized.  IMPRESSION: No acute abnormalities.    Review of Systems: General: no anorexia, weight gain or weight loss Cardiac: no orthopnea, PND,  or syncope Respiratory: + cough, no sputum production or hemoptysis; chronic class III dyspnea on exertion; intermittent wheezing GI: H/o abdominal pain without specific diagnosis; no emesis, diarrhea or constipation Integument: no significant lesions Neurologic: No muscle weakness or paralysis; no speech disturbance; no headache All other systems reviewed and are negative.  Physical Exam: Blood pressure 115/64, pulse 64, temperature 97.6 F (36.4 C), temperature source Oral, resp. rate 20, height 5\' 6"  (1.676 m), weight 87 kg (191 lb 12.8 oz), SpO2 97.00%.;  Body mass index is 30.97 kg/(m^2). General-Well-developed; no acute distress; mildly to moderately overweight HEENT-[place]/AT; PERRL; EOM intact; conjunctiva and lids nl; mild thyromegaly. Neck-No JVD; no carotid bruits Endocrine-No thyromegaly Lungs-decreased breath sounds; resonant percussion; prolonged expiratory phase without wheezing or rhonchi; increased AP diameter Cardiovascular- normal PMI; normal S1 and S2; S4 present Abdomen-BS normal; soft and non-tender without masses or organomegaly Musculoskeletal-No deformities, cyanosis or clubbing Neurologic-Nl cranial nerves; symmetric strength and tone Skin- Warm, no significant lesions Extremities-distal pulses intact; 1+ ankle and pretibial edema bilaterally  Assessment/Plan:  Chest pain: Description of symptoms certainly compatible with myocardial ischemia; however, cardiac markers and EKGs normal. Chest discomfort has been chronic and was not relieved following PCI for single vessel disease. Case discussed with Dr. Kristeen Miss who favors proceeding directly to repeat coronary angiography to determine if stent remains patent. Risks and benefits explained to patient. She agrees to transfer to [facility] for cardiac catheterization to be performed today. Patient's treatment with both full anticoagulation and a potent antiplatelet agent discussed with Dr. [place]. She has not taken Plavix for nearly 48 hours nor rivaroxaban for the past 30 hours. Plan is to proceed with catheterization today to be performed from the radial artery.  Peripheral edema: Likely secondary to venous insufficiency. Patient is already limiting salt intake, utilizing compression stockings and maintaining leg elevation when possible. At this point, stasis is mild, and there does not appear to be in the skin jeopardy. If condition worsens, consultation with a vascular expert may be useful.  Chronic anticoagulation: Rivaroxaban in Effient held for the time being, but should be continued at the time of hospital discharge.  [place], MD 12/06/2012, 12:57 PM

## 2012-12-07 NOTE — CV Procedure (Signed)
   Cardiac Catheterization Procedure Note  Name: Carrie [name] MRN: [id number] DOB: 1940-11-06  Procedure: Left Heart Cath, Selective Coronary Angiography, LV angiography  Indication: 72 yo WF with history of CAD s/p stenting of the LAD presents after an episode of severe chest pain. Cardiac enzymes are all negative.   Procedural details: The right groin was prepped, draped, and anesthetized with 1% lidocaine. Using modified Seldinger technique, a 5 French sheath was introduced into the right femoral artery. Standard Judkins catheters were used for coronary angiography and left ventriculography. Catheter exchanges were performed over a guidewire. There were no immediate procedural complications. The patient was transferred to the post catheterization recovery area for further monitoring.  Procedural Findings: Hemodynamics:  AO 149/81 mean 112 mm Hg LV 147/17 mm Hg   Coronary angiography: Coronary dominance: right  Left mainstem: Short, normal.  Left anterior descending (LAD): there is a stent in the mid LAD. There is a 30-40% stenosis in the distal stent. The first diagonal is a large branch and has a 30% stenosis at the ostium.  Left circumflex (LCx): Normal.  Right coronary artery (RCA): Large dominant vessel. Mild irregularities less than 10%.  Left ventriculography: Left ventricular systolic function is normal, LVEF is estimated at 55-65%, there is no significant mitral regurgitation   Final Conclusions:   1. Nonobstructive CAD 2. Normal LV function.  Recommendations: Continue medical therapy. Resume Effient today. Can resume Xarelto tomorrow. Plan DC later today after bedrest.  Judi Cong 12/07/2012, 9:31 AM

## 2012-12-07 NOTE — Discharge Summary (Signed)
Patient seen and examined and history reviewed. Agree with above findings and plan. See cardiac cath note.  Carrie [name] 12/07/2012 3:58 PM

## 2012-12-07 NOTE — Progress Notes (Signed)
Utilization review completed.  

## 2012-12-07 NOTE — Discharge Summary (Signed)
CARDIOLOGY DISCHARGE SUMMARY   Patient ID: Carrie [name] MRN: [id number] DOB/AGE: 71/19/1942 71 y.o.  Admit date: 12/05/2012 Discharge date: 12/07/2012  Primary Discharge Diagnosis:     Chest pain - medical therapy for nonobstructive coronary artery disease recommended  Secondary Discharge Diagnosis:    Intrinsic asthma, unspecified   Arteriosclerotic cardiovascular disease (ASCVD)   Depression   Chronic anxiety   S/P tooth extraction   History of pulmonary embolism   Chronic anticoagulation  Procedures: Lower extremity ultrasound, Left Heart Cath, Selective Coronary Angiography, LV angiography  Hospital Course: Carrie [name] is a 72 y.o. female with a history of CAD. She had severe chest pain that reached a 10/10. It resolved with sublingual nitroglycerin. She came to the hospital where she was admitted for further evaluation and treatment.  She had left lower extremity swelling and a history of PE. An ultrasound was performed which showed no DVT. Her cardiac enzymes were negative for MI. There was concern for ischemia causing her pain so she was taken to the cath lab on 12/07/2012.  Full cardiac catheterization results are below that there was no critical disease and medical therapy was recommended. She tolerated the procedure well. Post-procedure, her cath site was without hematoma. She was ambulating without chest pain or shortness of breath and considered stable for discharge, to follow up as an outpatient.  Labs:  Lab Results  Component Value Date   WBC 10.3 12/06/2012   HGB 13.1 12/06/2012   HCT 41.4 12/06/2012   MCV 90.4 12/06/2012   PLT 313 12/06/2012     Recent Labs Lab 12/05/12 1435 12/06/12 0606 12/06/12 2017  NA 143 143  --   K 3.6 4.4  --   CL 102 105  --   CO2 32 32  --   BUN 16 13  --   CREATININE 0.66 0.67 0.70  CALCIUM 9.4 9.0  --   PROT 7.0  --   --   BILITOT 0.3  --   --   ALKPHOS 80  --   --   ALT 22  --   --   AST 20  --   --   GLUCOSE 103*  112*  --     Recent Labs  12/05/12 1914 12/06/12 0032 12/06/12 0609  TROPONINI <0.30 <0.30 <0.30    Recent Labs  12/05/12 1435  INR 1.01      Radiology: US Venous Img Lower Unilateral Left 12/05/2012   *RADIOLOGY REPORT*  Clinical Data: Left lower extremity pain and edema.  History of prior DVT in 2012.  LEFT LOWER EXTREMITY VENOUS DUPLEX ULTRASOUND  Technique:  Gray-scale sonography with graded compression, as well as color Doppler and duplex ultrasound were performed to evaluate the deep venous system of the lower extremity from the level of the common femoral vein through the popliteal and proximal calf veins. Spectral Doppler was utilized to evaluate flow at rest and with distal augmentation maneuvers.  Comparison:  None.  Findings:  Normal compressibility of the common femoral, superficial femoral, and popliteal veins is demonstrated, as well as the visualized proximal calf veins.  No filling defects to suggest DVT on grayscale or color Doppler imaging.  Doppler waveforms show normal direction of venous flow, normal respiratory phasicity and response to augmentation.  There is no evidence of chronic mural thrombus.  No superficial thrombophlebitis or abnormal fluid collection is identified.  IMPRESSION: No evidence of left lower extremity deep vein thrombosis.   Original Report Authenticated By: Sherrine Maples  Fredia Sorrow, M.D.   Dg Chest Portable 1 View 12/05/2012   *RADIOLOGY REPORT*  Clinical Data: Chest pain post dental procedure with sedation, history asthma, coronary artery disease, hyperlipidemia  PORTABLE CHEST - 1 VIEW  Comparison: Portable exam at approximately 1440 hours compared to 05/23/2012; time signature on new portable radiograph unit is incorrect and is being reset.  Findings: Upper normal heart size. Mediastinal contours and pulmonary vascularity normal. Lungs clear. No pleural effusion or pneumothorax. Bones appear demineralized.  IMPRESSION: No acute abnormalities.   Original Report  Authenticated By: Ulyses Southward, M.D.    Cardiac Cath:  Left mainstem: Short, normal.  Left anterior descending (LAD): there is a stent in the mid LAD. There is a 30-40% stenosis in the distal stent. The first diagonal is a large branch and has a 30% stenosis at the ostium.  Left circumflex (LCx): Normal.  Right coronary artery (RCA): Large dominant vessel. Mild irregularities less than 10%.  Left ventriculography: Left ventricular systolic function is normal, LVEF is estimated at 55-65%, there is no significant mitral regurgitation  Final Conclusions:  1. Nonobstructive CAD  2. Normal LV function.  Recommendations: Continue medical therapy. Resume Effient today. Can resume Xarelto tomorrow. Plan DC later today after bedrest.  EKG:05-Dec-2012 13:59:39  Normal sinus rhythm Low voltage QRS Borderline ECG Vent. rate 75 BPM PR interval 168 ms QRS duration 78 ms QT/QTc 390/435 ms P-R-T axes 71 70 67  FOLLOW UP PLANS AND APPOINTMENTS Allergies  Allergen Reactions  . Aspirin Hives  . Metoclopramide Hcl     REACTION: intolerant, restless hands  . Morphine Hives  . Adhesive (Tape) Itching and Rash    ONLY TO USE PAPER TAPE  . Codeine Itching and Rash  . Penicillins Itching and Rash  . Sulfonamide Derivatives Itching and Rash     Medication List    TAKE these medications       atorvastatin 80 MG tablet  Commonly known as:  LIPITOR  Take 1 tablet (80 mg total) by mouth at bedtime.     benzonatate 100 MG capsule  Commonly known as:  TESSALON  Take 100 mg by mouth 2 (two) times daily.     bisacodyl 5 MG EC tablet  Commonly known as:  DULCOLAX  Take 5 mg by mouth daily as needed for constipation.     budesonide-formoterol 80-4.5 MCG/ACT inhaler  Commonly known as:  SYMBICORT  Inhale 2 puffs into the lungs daily as needed (shortness of breath).     clotrimazole 10 MG troche  Commonly known as:  MYCELEX  Take 10 mg by mouth 5 (five) times daily as needed (thrush).      docusate sodium 100 MG capsule  Commonly known as:  COLACE  Take 100 mg by mouth daily.     donepezil 10 MG tablet  Commonly known as:  ARICEPT  Take 10 mg by mouth at bedtime as needed.     DULERA 200-5 MCG/ACT Aero  Generic drug:  mometasone-formoterol  Inhale 2 puffs into the lungs 3 (three) times daily.     ezetimibe 10 MG tablet  Commonly known as:  ZETIA  Take 10 mg by mouth at bedtime.     fluticasone 50 MCG/ACT nasal spray  Commonly known as:  FLONASE  Place 2 sprays into the nose 2 (two) times daily.     furosemide 40 MG tablet  Commonly known as:  LASIX  Take 40 mg by mouth daily.     ipratropium-albuterol 0.5-2.5 (3) MG/3ML Soln  Commonly known as:  DUONEB  Take 3 mLs by nebulization every 6 (six) hours as needed. For shortness of breath     LORazepam 1 MG tablet  Commonly known as:  ATIVAN  Take 1 mg by mouth 3 (three) times daily as needed. May also take 1 mg at lunchtime if needed     Magnesium 500 MG Caps  Take 500 mg by mouth every morning.     montelukast 10 MG tablet  Commonly known as:  SINGULAIR  Take 10 mg by mouth at bedtime.     multivitamin with minerals Tabs  Take 1 tablet by mouth every morning.     NEXIUM PO  Take 1 capsule by mouth at bedtime.     nitroGLYCERIN 0.4 MG SL tablet  Commonly known as:  NITROSTAT  Place 1 tablet (0.4 mg total) under the tongue every 5 (five) minutes as needed for chest pain (up to 3 doses).     pantoprazole 40 MG tablet  Commonly known as:  PROTONIX  Take 1 tablet (40 mg total) by mouth every morning.     potassium chloride SA 20 MEQ tablet  Commonly known as:  K-DUR,KLOR-CON  Take 20 mEq by mouth 2 (two) times daily.     prasugrel 5 MG Tabs  Commonly known as:  EFFIENT  Take 1 tablet (5 mg total) by mouth every morning.     PRESCRIPTION MEDICATION  Inject 80 mg as directed every 30 (thirty) days. Steroidal Injection (FOR LUNGS) received every month since June 2013. Administered by Michelle Piper, MD  at [facility]. Due for next injection on June 01, 2012.     PROAIR HFA 108 (90 BASE) MCG/ACT inhaler  Generic drug:  albuterol  Inhale 2 puffs into the lungs every 6 (six) hours as needed. For rescue/shortness of breath     Rivaroxaban 15 MG Tabs tablet  Commonly known as:  XARELTO  Take 1 tablet (15 mg total) by mouth daily with supper.     traZODone 50 MG tablet  Commonly known as:  DESYREL  Take 50 mg by mouth at bedtime.     TRUBIOTICS Caps  Take 1 capsule by mouth daily.     venlafaxine XR 75 MG 24 hr capsule  Commonly known as:  EFFEXOR-XR  Take 75 mg by mouth at bedtime. 1 tablets at bedtime        Discharge Orders   Future Appointments Provider Department Dept Phone   12/27/2012 12:30 PM Dyann Kief, PA-C [facility] Main Office [place]) [phone]   Future Orders Complete By Expires     Diet - low sodium heart healthy  As directed     Increase activity slowly  As directed       Follow-up Information   Follow up with Jacolyn Reedy, PA-C On 12/27/2012. (See for Dr Elease Hashimoto at 12:30 pm)    Contact information:   [address] [place], [address] [place] [address] [phone]       BRING ALL MEDICATIONS WITH YOU TO FOLLOW UP APPOINTMENTS  Time spent with patient to include physician time:  36 min Signed: Theodore Demark, PA-C 12/07/2012, 3:52 PM Co-Sign MD

## 2012-12-27 ENCOUNTER — Encounter: Payer: Medicare Other | Admitting: Physician Assistant

## 2013-01-14 ENCOUNTER — Ambulatory Visit (INDEPENDENT_AMBULATORY_CARE_PROVIDER_SITE_OTHER): Payer: Medicare Other | Admitting: Physician Assistant

## 2013-01-14 ENCOUNTER — Encounter: Payer: Self-pay | Admitting: Physician Assistant

## 2013-01-14 VITALS — BP 124/70 | HR 81 | Ht 66.0 in | Wt 185.0 lb

## 2013-01-14 DIAGNOSIS — I25810 Atherosclerosis of coronary artery bypass graft(s) without angina pectoris: Secondary | ICD-10-CM

## 2013-01-14 DIAGNOSIS — E785 Hyperlipidemia, unspecified: Secondary | ICD-10-CM

## 2013-01-14 DIAGNOSIS — I2699 Other pulmonary embolism without acute cor pulmonale: Secondary | ICD-10-CM

## 2013-01-14 DIAGNOSIS — I6529 Occlusion and stenosis of unspecified carotid artery: Secondary | ICD-10-CM

## 2013-01-14 NOTE — Progress Notes (Signed)
[address] [place], [place]  [address] Phone: [phone] Fax:  [phone]  Date:  01/14/2013   ID:  Carrie [name], DOB July 12, 1940, MRN [id number]  PCP:  Catalina Pizza, MD  Cardiologist:  Dr. Delane Ginger     History of Present Illness: Carrie [name] is a 72 y.o. female he returns for followup after recent admission to the hospital 6/18-6/20. She has a history of CAD, status post DMS to the LAD and 10/13 in the setting of unstable angina, prior pulmonary embolism, HL, asthma, carotid stenosis. Echo 10/13: EF 60-65%, normal wall motion. Carotid US 1/13:  60-79% RICA.  She was initially evaluated by Dr. Dietrich Pates at [facility] for severe chest pain that resolved with sublingual nitroglycerin. Cardiac markers remained normal. Lower extremity US negative for DVT. She was transferred to [facility] for cardiac catheterization. LHC 12/07/12: Mid LAD stent patent with 30-40% at distal stent, ostial D1 30%, RCA less than 10%, EF 55-65%. Medical therapy continued.  Doing well since discharge.  No further CP.  Breathing is stable.  No syncope.  No orthopnea, PND.  She has chronic left LE edema without change.    Labs (6/14):  K 4.4, Cr 0.67, ALT 22, Hgb 13.1, TSH 1.791  Wt Readings from Last 3 Encounters:  01/14/13 185 lb (83.915 kg)  12/06/12 191 lb 12.8 oz (87 kg)  12/06/12 191 lb 12.8 oz (87 kg)     Past Medical History  Diagnosis Date  . Hyperlipidemia   . Anxiety   . Chronic asthma   . Persistent headaches   . PONV (postoperative nausea and vomiting) 1983  . Arteriosclerotic cardiovascular disease (ASCVD)      single vessel CAD involving the mid LAD, s/p BMS to mid LAD 03/26/12, nl LV systolic fxn  . Pulmonary embolism     Rx: lifelong coumadin 03/2012  . Chronic anticoagulation     Warfarin led to variable INRs; now Rx with rivaroxaban    Current Outpatient Prescriptions  Medication Sig Dispense Refill  . albuterol (PROAIR HFA) 108 (90 BASE) MCG/ACT inhaler  Inhale 2 puffs into the lungs every 6 (six) hours as needed. For rescue/shortness of breath      . atorvastatin (LIPITOR) 80 MG tablet Take 1 tablet (80 mg total) by mouth at bedtime.  30 tablet  6  . benzonatate (TESSALON) 100 MG capsule Take 200 mg by mouth 2 (two) times daily.       . bisacodyl (DULCOLAX) 5 MG EC tablet Take 5 mg by mouth daily as needed for constipation.      . clotrimazole (MYCELEX) 10 MG troche Take 10 mg by mouth 5 (five) times daily as needed (thrush).      . donepezil (ARICEPT) 10 MG tablet Take 10 mg by mouth at bedtime as needed.      . Esomeprazole Magnesium (NEXIUM PO) Take 1 capsule by mouth at bedtime.      [name] ezetimibe (ZETIA) 10 MG tablet Take 10 mg by mouth at bedtime.      . fluticasone (FLONASE) 50 MCG/ACT nasal spray Place 2 sprays into the nose 2 (two) times daily.      . furosemide (LASIX) 40 MG tablet Take 40 mg by mouth daily.      [name] ipratropium-albuterol (DUONEB) 0.5-2.5 (3) MG/3ML SOLN Take 3 mLs by nebulization every 6 (six) hours as needed. For shortness of breath      . LORazepam (ATIVAN) 1 MG tablet Take 1  mg by mouth 3 (three) times daily as needed. May also take 1 mg at lunchtime if needed      . Magnesium 500 MG CAPS Take 500 mg by mouth every morning.       . Mometasone Furo-Formoterol Fum (DULERA) 200-5 MCG/ACT AERO Inhale 2 puffs into the lungs 3 (three) times daily.      . montelukast (SINGULAIR) 10 MG tablet Take 10 mg by mouth at bedtime.      . Multiple Vitamin (MULITIVITAMIN WITH MINERALS) TABS Take 1 tablet by mouth every morning.       . nitroGLYCERIN (NITROSTAT) 0.4 MG SL tablet Place 1 tablet (0.4 mg total) under the tongue every 5 (five) minutes as needed for chest pain (up to 3 doses).  25 tablet  3  . pantoprazole (PROTONIX) 40 MG tablet Take 1 tablet (40 mg total) by mouth every morning.  30 tablet  3  . potassium chloride SA (K-DUR,KLOR-CON) 20 MEQ tablet Take 20 mEq by mouth 2 (two) times daily.      . prasugrel (EFFIENT) 5 MG  TABS Take 1 tablet (5 mg total) by mouth every morning.  30 tablet  6  . Probiotic Product (TRUBIOTICS) CAPS Take 1 capsule by mouth daily.      . Rivaroxaban (XARELTO) 15 MG TABS tablet Take 1 tablet (15 mg total) by mouth daily with supper.  30 tablet  11  . traZODone (DESYREL) 50 MG tablet Take 50 mg by mouth at bedtime.      [name] venlafaxine (EFFEXOR-XR) 75 MG 24 hr capsule Take 75 mg by mouth at bedtime. 1 tablets at bedtime      . budesonide-formoterol (SYMBICORT) 80-4.5 MCG/ACT inhaler Inhale 2 puffs into the lungs daily as needed (shortness of breath).        No current facility-administered medications for this visit.    Allergies:    Allergies  Allergen Reactions  . Aspirin Hives  . Metoclopramide Hcl     REACTION: intolerant, restless hands  . Morphine Hives  . Adhesive (Tape) Itching and Rash    ONLY TO USE PAPER TAPE  . Codeine Itching and Rash  . Penicillins Itching and Rash  . Sulfonamide Derivatives Itching and Rash    Social History:  The patient  reports that she has never smoked. She does not have any smokeless tobacco history on file. She reports that she does not drink alcohol or use illicit drugs.   ROS:  Please see the history of present illness.   No melena, hematochezia, hematuria.   All other systems reviewed and negative.   PHYSICAL EXAM: VS:  BP 124/70  Pulse 81  Ht 5\' 6"  (1.676 m)  Wt 185 lb (83.915 kg)  BMI 29.87 kg/m2 Well nourished, well developed, in no acute distress HEENT: normal Neck: no JVD Vascular:  No carotid bruits Cardiac:  normal S1, S2; RRR; no murmur Lungs:  clear to auscultation bilaterally, no wheezing, rhonchi or rales Abd: soft, nontender, no hepatomegaly Ext: no edema; right groin without hematoma or bruit  Skin: warm and dry Neuro:  CNs 2-12 intact, no focal abnormalities noted  EKG:  NSR, HR 81, no changes     ASSESSMENT AND PLAN:  1. CAD:  No angina.  Continue Effient, statin.  Will give samples of Effient.  She is  allergic to ASA.   2. Hx of Pulmonary Embolism:  Continue Xarelto.   3. Carotid Stenosis:  No f/u US since 06/2011.  Will arrange carotid  US. 4. Asthma:  Followed by pulmonology at [facility].   5. Hyperlipidemia:  Continue statin.   6. Disposition:  F/u with Dr. Delane Ginger in 03/2013 (1 year post PCI).  Signed, Tereso Newcomer, PA-C  01/14/2013 4:08 PM

## 2013-01-14 NOTE — Patient Instructions (Addendum)
PLEASE SCHEDULE FOR CAROTID DUPLEX TO BE DONE DX 433.10 ON THE RIGHT  MAKE SURE TO FOLLOW UP WITH DR. Elease Hashimoto

## 2013-01-17 ENCOUNTER — Encounter (INDEPENDENT_AMBULATORY_CARE_PROVIDER_SITE_OTHER): Payer: Medicare Other

## 2013-01-17 DIAGNOSIS — I6529 Occlusion and stenosis of unspecified carotid artery: Secondary | ICD-10-CM

## 2013-01-17 DIAGNOSIS — I25810 Atherosclerosis of coronary artery bypass graft(s) without angina pectoris: Secondary | ICD-10-CM

## 2013-01-21 ENCOUNTER — Other Ambulatory Visit (HOSPITAL_COMMUNITY): Payer: Self-pay | Admitting: Internal Medicine

## 2013-01-21 ENCOUNTER — Encounter: Payer: Self-pay | Admitting: Physician Assistant

## 2013-01-21 DIAGNOSIS — I2510 Atherosclerotic heart disease of native coronary artery without angina pectoris: Secondary | ICD-10-CM

## 2013-01-21 DIAGNOSIS — R55 Syncope and collapse: Secondary | ICD-10-CM

## 2013-01-21 DIAGNOSIS — R42 Dizziness and giddiness: Secondary | ICD-10-CM

## 2013-01-22 ENCOUNTER — Ambulatory Visit (HOSPITAL_COMMUNITY)
Admission: RE | Admit: 2013-01-22 | Discharge: 2013-01-22 | Disposition: A | Discharge status: Home / Self Care | Payer: Medicare Other | Source: Ambulatory Visit | Attending: Internal Medicine | Admitting: Internal Medicine

## 2013-01-22 DIAGNOSIS — I2510 Atherosclerotic heart disease of native coronary artery without angina pectoris: Secondary | ICD-10-CM

## 2013-01-22 DIAGNOSIS — R51 Headache: Secondary | ICD-10-CM | POA: Insufficient documentation

## 2013-01-22 DIAGNOSIS — R55 Syncope and collapse: Secondary | ICD-10-CM | POA: Insufficient documentation

## 2013-01-22 DIAGNOSIS — R42 Dizziness and giddiness: Secondary | ICD-10-CM | POA: Insufficient documentation

## 2013-01-22 DIAGNOSIS — I672 Cerebral atherosclerosis: Secondary | ICD-10-CM | POA: Insufficient documentation

## 2013-01-29 ENCOUNTER — Encounter: Payer: Self-pay | Admitting: Neurology

## 2013-01-30 ENCOUNTER — Encounter: Payer: Self-pay | Admitting: Neurology

## 2013-01-30 ENCOUNTER — Ambulatory Visit (INDEPENDENT_AMBULATORY_CARE_PROVIDER_SITE_OTHER): Payer: Medicare Other | Admitting: Neurology

## 2013-01-30 ENCOUNTER — Other Ambulatory Visit: Payer: Self-pay | Admitting: *Deleted

## 2013-01-30 VITALS — BP 124/78 | HR 87 | Ht 65.0 in | Wt 187.0 lb

## 2013-01-30 DIAGNOSIS — R55 Syncope and collapse: Secondary | ICD-10-CM

## 2013-01-30 DIAGNOSIS — H8110 Benign paroxysmal vertigo, unspecified ear: Secondary | ICD-10-CM

## 2013-01-30 HISTORY — DX: Benign paroxysmal vertigo, unspecified ear: H81.10

## 2013-01-30 NOTE — Progress Notes (Signed)
Reason for visit: Dizziness  Carrie [name] is a 72 y.o. female  History of present illness:  Carrie [name] is a 72 year old right-handed white female with a history of coronary artery disease, pulmonary embolism, and syncope. The patient gives a history of vertigo that occurred 3 or 4 years ago, and this resolved with vestibular rehabilitation. The patient indicates that one month ago, she rolled over to look at the clock, and she had onset of vertigo that lasted several moments. The patient has had episodes of dizziness since that time that generally occurs with changes in head position. The patient will have dizziness when she lies down, or sits up. The patient will have dizziness if she looks up or looks down. The dizziness will last 1 or 2 minutes, and then resolve. The patient has had 2 syncopal event on the same day. The patient was standing with these events, and appeared to be pale, clammy. The patient indicates that she did feel some palpitations of the heart. The patient had loss of consciousness for a few moments without stiffening or jerking or tongue biting. The patient has had episodes of near-syncope, and she was able to avoid a blackout by sitting down or lying down. The patient may feel "wiped out" afterwards. The patient has had a coronary artery stent placement within the last year. The patient has also had some problems with headaches over the last one month. The patient denies any focal numbness or weakness of the face, arms, or legs. The patient has been followed for her memory issues, and she has been on low-dose Aricept for one year. MRI evaluation the brain did not show definite stroke, but the study questions a small infarct in the left dorsal brainstem. MRA showed mild intracranial atherosclerosis. Carotid Doppler studies were unremarkable. The patient comes to this office for an evaluation.  Past Medical History  Diagnosis Date  . Hyperlipidemia   . Anxiety   . Chronic asthma    . Persistent headaches   . PONV (postoperative nausea and vomiting) 1983  . Arteriosclerotic cardiovascular disease (ASCVD)      single vessel CAD involving the mid LAD, s/p BMS to mid LAD 03/26/12, nl LV systolic fxn  . Pulmonary embolism     Rx: lifelong coumadin 03/2012  . Chronic anticoagulation     Warfarin led to variable INRs; now Rx with rivaroxaban  . Carotid stenosis     Carotid US 7/14:  Bilat.  ICA 1-39% => f/u 1 yr.  [name] GERD (gastroesophageal reflux disease)   . Dementia   . CAD (coronary artery disease)   . Cough   . Syncope and collapse   . Memory deficit   . Benign paroxysmal positional vertigo 01/30/2013    Past Surgical History  Procedure Laterality Date  . Appendectomy    . Tubal ligation    . Cystic surgery on breast      bilateral  . Left ankle surgery    . Shoulder surgery  06/2010    bone spurs left  . Coronary stent placement  03/26/2012  . Coronary angioplasty with stent placement    . Breast surgery      Family History  Problem Relation Age of Onset  . Heart attack Mother   . Breast cancer Mother   . Colon polyps Mother   . Heart attack Father   . Cirrhosis Sister   . Diabetes Sister   . Heart attack Sister   . Heart disease Brother   .  Diabetes Brother   . Heart disease Brother   . Ovarian cancer Sister   . Ovarian cancer Sister     Social history:  reports that she has never smoked. She has never used smokeless tobacco. She reports that she does not drink alcohol or use illicit drugs.  Medications:  Current Outpatient Prescriptions on File Prior to Visit  Medication Sig Dispense Refill  . albuterol (PROAIR HFA) 108 (90 BASE) MCG/ACT inhaler Inhale 2 puffs into the lungs every 6 (six) hours as needed. For rescue/shortness of breath      . atorvastatin (LIPITOR) 80 MG tablet Take 1 tablet (80 mg total) by mouth at bedtime.  30 tablet  6  . benzonatate (TESSALON) 100 MG capsule Take 200 mg by mouth 2 (two) times daily.       . bisacodyl  (DULCOLAX) 5 MG EC tablet Take 5 mg by mouth daily as needed for constipation.      . budesonide-formoterol (SYMBICORT) 80-4.5 MCG/ACT inhaler Inhale 2 puffs into the lungs daily as needed (shortness of breath).       . clotrimazole (MYCELEX) 10 MG troche Take 10 mg by mouth 5 (five) times daily as needed (thrush).      . donepezil (ARICEPT) 10 MG tablet Take 10 mg by mouth at bedtime as needed.      . Esomeprazole Magnesium (NEXIUM PO) Take 1 capsule by mouth at bedtime.      [name] ezetimibe (ZETIA) 10 MG tablet Take 10 mg by mouth at bedtime.      . fluticasone (FLONASE) 50 MCG/ACT nasal spray Place 2 sprays into the nose 2 (two) times daily.      . furosemide (LASIX) 40 MG tablet Take 40 mg by mouth daily.      [name] ipratropium-albuterol (DUONEB) 0.5-2.5 (3) MG/3ML SOLN Take 3 mLs by nebulization every 6 (six) hours as needed. For shortness of breath      . LORazepam (ATIVAN) 1 MG tablet Take 1 mg by mouth 3 (three) times daily as needed. May also take 1 mg at lunchtime if needed      . Magnesium 500 MG CAPS Take 500 mg by mouth every morning.       . Mometasone Furo-Formoterol Fum (DULERA) 200-5 MCG/ACT AERO Inhale 2 puffs into the lungs 3 (three) times daily.      . montelukast (SINGULAIR) 10 MG tablet Take 10 mg by mouth at bedtime.      . Multiple Vitamin (MULITIVITAMIN WITH MINERALS) TABS Take 1 tablet by mouth every morning.       . nitroGLYCERIN (NITROSTAT) 0.4 MG SL tablet Place 1 tablet (0.4 mg total) under the tongue every 5 (five) minutes as needed for chest pain (up to 3 doses).  25 tablet  3  . pantoprazole (PROTONIX) 40 MG tablet Take 1 tablet (40 mg total) by mouth every morning.  30 tablet  3  . potassium chloride SA (K-DUR,KLOR-CON) 20 MEQ tablet Take 20 mEq by mouth 2 (two) times daily.      . prasugrel (EFFIENT) 5 MG TABS Take 1 tablet (5 mg total) by mouth every morning.  30 tablet  6  . Probiotic Product (TRUBIOTICS) CAPS Take 1 capsule by mouth daily.      . Rivaroxaban (XARELTO)  15 MG TABS tablet Take 1 tablet (15 mg total) by mouth daily with supper.  30 tablet  11  . traZODone (DESYREL) 50 MG tablet Take 50 mg by mouth at bedtime.      [name]  venlafaxine (EFFEXOR-XR) 75 MG 24 hr capsule Take 75 mg by mouth at bedtime. 1 tablets at bedtime       No current facility-administered medications on file prior to visit.    Allergies:  Allergies  Allergen Reactions  . Aspirin Hives  . Metoclopramide Hcl     REACTION: intolerant, restless hands  . Morphine Hives  . Adhesive [Tape] Itching and Rash    ONLY TO USE PAPER TAPE  . Codeine Itching and Rash  . Penicillins Itching and Rash  . Sulfonamide Derivatives Itching and Rash    ROS:  Out of a complete 14 system review of symptoms, the patient complains only of the following symptoms, and all other reviewed systems are negative.  Fatigue Swelling the legs, left greater than right Difficulty swallowing Blurred vision Shortness of breath, cough, wheezing Constipation Easy bruising, easy bleeding Achy muscles Allergies, runny nose, skin sensitivity Confusion, headache, weakness, dizziness, syncope Depression, anxiety Restless legs  Blood pressure 124/78, pulse 87, height 5\' 5"  (1.651 m), weight 187 lb (84.823 kg).  Physical Exam  General: The patient is alert and cooperative at the time of the examination. The patient is minimally to moderately obese.  Head: Pupils are equal, round, and reactive to light. Discs are flat bilaterally. Tympanic membranes are clear bilaterally.  Neck: The neck is supple, no carotid bruits are noted.  Respiratory: The respiratory examination is clear.  Cardiovascular: The cardiovascular examination reveals a regular rate and rhythm, no obvious murmurs or rubs are noted.  Skin: Extremities are without significant edema.  Neurologic Exam  Mental status:  Cranial nerves: Facial symmetry is present. There is good sensation of the face to pinprick on the left, decreased on the  right. The patient splits the midline with vibration sensation, decreased on the right.. The strength of the facial muscles and the muscles to head turning and shoulder shrug are normal bilaterally. Speech is well enunciated, no aphasia or dysarthria is noted. Extraocular movements are full. Visual fields are full.  Motor: The motor testing reveals 5 over 5 strength of all 4 extremities. Good symmetric motor tone is noted throughout.  Sensory: Sensory testing is intact to pinprick, soft touch, vibration sensation, and position sense on all 4 extremities, with the exception that there is a decrease in position sense in both feet, and there is decreased pinprick sensation of the right arm.. No evidence of extinction is noted.  Coordination: Cerebellar testing reveals good finger-nose-finger and heel-to-shin bilaterally. The Nyan-Barrany procedure was positive. With lying back, the patient developed prominent horizontal and rotatory nystagmus associated with subjective vertigo. This resolved after 30-40 seconds.  Gait and station: Gait is slightly wide-based.. Tandem gait is unsteady. Romberg is slightly unsteady, the patient does not fall. No drift is seen.  Reflexes: Deep tendon reflexes are symmetric, but are depressed bilaterally. Toes are downgoing bilaterally.   Assessment/Plan:  1. Episodic vertigo, benign positional vertigo  2. Syncope, probable vasovagal syncope  3. Coronary artery disease  4. History of pulmonary embolism  The patient appears to have onset of nystagmus with the Nyan-Barrany maneuver, suggestive of positional vertigo. The patient will be sent for vestibular rehabilitation. The patient has also had syncopal events, likely vasovagal syncope. The patient will be sent for a CardioNet monitor study for 3 weeks. The patient followup in 3 or 4 months.  Marlan Palau MD 01/30/2013 7:03 PM  [facility] [address] [place], [place]  [address]  Phone [phone] Fax [phone]

## 2013-02-04 ENCOUNTER — Other Ambulatory Visit: Payer: Self-pay | Admitting: *Deleted

## 2013-02-04 ENCOUNTER — Ambulatory Visit: Payer: Medicare Other | Attending: Neurology | Admitting: Physical Therapy

## 2013-02-04 DIAGNOSIS — IMO0001 Reserved for inherently not codable concepts without codable children: Secondary | ICD-10-CM | POA: Insufficient documentation

## 2013-02-04 DIAGNOSIS — H8110 Benign paroxysmal vertigo, unspecified ear: Secondary | ICD-10-CM

## 2013-02-04 DIAGNOSIS — R55 Syncope and collapse: Secondary | ICD-10-CM

## 2013-02-05 ENCOUNTER — Ambulatory Visit: Payer: Medicare Other | Admitting: *Deleted

## 2013-02-05 DIAGNOSIS — R55 Syncope and collapse: Secondary | ICD-10-CM

## 2013-02-05 NOTE — Patient Instructions (Addendum)
Your physician recommends that you schedule a follow-up appointment in: TO BE DETERMINED  Your physician has recommended that you wear an event monitor. Event monitors are medical devices that record the heart's electrical activity. Doctors most often [place] these monitors to diagnose arrhythmias. Arrhythmias are problems with the speed or rhythm of the heartbeat. The monitor is a small, portable device. You can wear one while you do your normal daily activities. This is usually used to diagnose what is causing palpitations/syncope (passing out).02-26-13

## 2013-02-26 ENCOUNTER — Telehealth: Payer: Self-pay | Admitting: Internal Medicine

## 2013-02-26 NOTE — Telephone Encounter (Signed)
Fine with me

## 2013-02-26 NOTE — Telephone Encounter (Signed)
Spoke with Mardelle Matte at [facility] and they are going through an audit and they do not have the written rx for Carrie [name] that was given to pt on 11/04/11 ov with Dr Sherene Sires.  Mardelle Matte is coming to our office tomorrow and would like to know if Dr Sherene Sires will sign a paper showing that the rx was written for pt at that time.  Please advise if willing to sign form.

## 2013-02-26 NOTE — Telephone Encounter (Signed)
I spoke with Mardelle Matte and advised. Carron Curie, CMA

## 2013-03-05 ENCOUNTER — Telehealth: Payer: Self-pay | Admitting: Internal Medicine

## 2013-03-05 NOTE — Telephone Encounter (Signed)
Per 9.9.14 phone note, Mardelle Matte (pharmacist at [facility]) was to drop off rx for pt's Brovana neb as they are currently under an audit and the original rx has been misplaced.  Mardelle Matte stated that he was to receive a call back regarding this once complete, but he has not heard anything.  Mardelle Matte to fax over the info that is needed (to the triage fax) and he requests that the rx be placed on letterhead and faxed back to him at [phone].  Will hold message in triage to await fax.

## 2013-03-06 NOTE — Telephone Encounter (Signed)
Rx was signed and ready for p/u Pharmacy aware

## 2013-03-06 NOTE — Telephone Encounter (Signed)
ATC andy bc there is nothing in triage. WCB

## 2013-03-11 ENCOUNTER — Telehealth: Payer: Self-pay | Admitting: Cardiovascular Disease

## 2013-03-11 NOTE — Telephone Encounter (Signed)
Monitor was placed in [place]/ will forward.

## 2013-03-11 NOTE — Telephone Encounter (Signed)
Pt wore cardiac monitor x 3 wks & wanted to speak with them about results. Please call back at [phone]

## 2013-03-12 ENCOUNTER — Telehealth: Payer: Self-pay | Admitting: *Deleted

## 2013-03-12 NOTE — Telephone Encounter (Signed)
Noted the pt report was sent in from [employer], however only noted that the pt had a few days of readings once Dr Wyline Mood reviewed, this nurse contacted Lanora Manis with [facility] and advised unfortunately these few readings is all they have for the pt per noted the pt may not have pushed the button and these are the only recordings we have to review, will place the few recordings on Dr Verna Czech desk for review and will contact the pt with instructions once received

## 2013-03-12 NOTE — Telephone Encounter (Signed)
Currently waiting to be addressed, see phone note opened 03-12-13

## 2013-03-12 NOTE — Telephone Encounter (Signed)
Pt was informed to call [place] office to inquire. I forwarded msg earlier,  Pt agreed to plan.

## 2013-03-12 NOTE — Telephone Encounter (Signed)
Pt is calling for heart monitor results

## 2013-03-12 NOTE — Telephone Encounter (Signed)
Late entry: Monitor results given to Dr Wyline Mood 03-11-13, pending results at this time, will contact pt once received

## 2013-03-13 ENCOUNTER — Telehealth: Payer: Self-pay | Admitting: Neurology

## 2013-03-13 NOTE — Telephone Encounter (Signed)
Received a call from [place] in [place] [facility] in [place].  [phone].  She stated that the pt wore a [facility] [employer] cardiac event monitor for 21 days.   Apparently the monitor read for a short period of time.  ? Of what happened (pt not push button??)  They did not call pt when noticed not monitoring??  Manager of [employer] on vacation and is being made aware of situation.   Daughter upset, and pt has dementia and is not going to wear again she states.   They will fax report and impression of what they have per Dr. Wyline Mood.  They apologize for this.

## 2013-03-13 NOTE — Telephone Encounter (Signed)
Informed pt that Dr. Verna Czech office would contact her with holter monitor results. There appeared to be a problem with her readings.

## 2013-03-13 NOTE — Telephone Encounter (Signed)
This nurse contacted pt daughter Aram Beecham to advise the reading has been sent to Dr Anne Hahn office to advise pt per ordering MD, pt daughter noted the pt is aware the reading from the monitor only displayed a few readings over a 21 day period and this nurse was advised by [employer] rep that was the only readings they received for this pt per the pt did not push the event button, when this nurse advised if when pt's do not push the button we always get more readings that once in 21days, per pt has dementia and unable to push the button, the rep advised no one called the pt to advise any readings were meeting, this nurse reported concerns to manager with [employer] by email per out of the office at this time, also reported to Dr Wyline Mood and manager YL, this nurse apologized for inconvenience and that this is being investigated and I will personally fax the end of service report to Dr Anne Hahn personally per noted scanner still temporarily out of service,  I also advised that unless the pt is having critical or serious issues our office is not informed about the monitor while in process, only receive a End of Service report once the monitor is complete, Andrey Campanile RN was advised by this nurse to contact the pt daughter Aram Beecham today please per the pt is upset and refuses to wear the monitor again, Andrey Campanile advised Dr Anne Hahn is normally really good about addressing his phone notes and she will call the pt no later than tomorrow however if she can call back today she will, this nurse manually faxed the report addressed to Andrey Campanile, this nurse then contacted cynthia to advise sandy will call back no later than tomorrow with the plan per Dr Anne Hahn and that I will keep in touch with her as well over the investigation into the lack of information from [employer], pt daughter Aram Beecham understood

## 2013-03-14 ENCOUNTER — Encounter: Payer: Self-pay | Admitting: *Deleted

## 2013-03-14 NOTE — Telephone Encounter (Signed)
This encounter was created in error - please disregard.

## 2013-03-14 NOTE — Telephone Encounter (Signed)
I called the patient. The CardioNet monitor apparently only gained data for one day. There apparently was some technical issue with the monitor. If the patient desires to repeat study, she is to contact our office, and I will reorder the monitor study.

## 2013-03-14 NOTE — Telephone Encounter (Signed)
Called pt to see if DR Anne Hahn gave an update, pt daughter advised that Dr Anne Hahn called personally this am and noted he was very sorry for the inconvenience and that the pt will not be charged for the first study however it is very important that she have the study again and we will make sure she is carefully and closely monitored,this nurse advised a call from [facility] is pending until Monday and we will contact her back once the new study is set up and confirmation received that she will be monitored closely, pt only agreed to a 2 week study, pt daughter understood

## 2013-03-18 NOTE — Telephone Encounter (Signed)
This nurse spoke to [place] with [employer] to advise the pt conflict with the pt [employer] monitor and data only collected for one day, Mitch advised he was very upset and will be investigating these issues and contact this nurse back tomorrow, the pt daughter was updated on the progress and was very appreciative of the update, advised we will contact her again tomorrow if not by this week, Dr Wyline Mood made aware of update verbally

## 2013-03-21 NOTE — Telephone Encounter (Signed)
LATE ENTRY: Carrie [name] with [employer] has tried to contact this nurse promptly 03-20-13 however working in clinic and  unable to discuss at that time, this nurse spoke with Carrie [name] with [employer] whom advised the pt insurance would not pay for the pt to have telemetry included in her monitor and therefore the baseline readings are protcol for the Loop Monitor/CEM setting that the pt monitor was defaulted to per only monitoring the pt insurance company would pay for therefore protocol notes the pt will not be contacted after 48 hours unless something is wrong, an [employer] rep did contact the pt on 03-08-13 to advise an adjustment needed to be made, therefore the monitor noted as reading correctly after adjustment made, Carrie [name] did offer to place another monitor on for the pt however notes the protocol for the free monitor is the same setting the pt already had this last trial period and the pt will more than likely see the same results however [employer] is more than happy to do this for the pt however per the same outcome and education as to information Dr Anne Hahn is seeking, Carrie [name] was given the number to Dr Anne Hahn office to speak with nurse Carrie [name] to advise the findings of the investigation and if the second monitor would be beneficial based on what Dr Anne Hahn is looking for, this nurse will keep monitoring pt chart to see update and ensure pt and daughter are made aware

## 2013-03-21 NOTE — Telephone Encounter (Signed)
Carrie [name], from [employer] called [phone].  Stated that can repeat test (2 wk) spoke to pt ok to do, other 3 wk test be a no charge.  The same machine will be used due to her insurance, pt has to wear and keep on, e cardio does not get updates, only receives data if pt has any arrhythmia and then is sent to [place] phone/report after episode.  It could be that she may have same result as previous study. Do you want to proceed.??

## 2013-03-22 NOTE — Telephone Encounter (Signed)
This nurse contacted Carrie [name] with [employer] to see if any updates were arranged, mtich noted he spoke with Carrie [name] at Dr Carrie [name] office yesterday 03-21-13 and she advised she would pass the information onto Dr Carrie [name] and he may call Carrie [name], his cell number was given to [facility] and at this time he has not received a call back. This nurse then contacted the pt daughter Carrie [name] to see if they had been contacted by Dr Carrie [name] office, pt daughter advised they have not received a call, advised the reason the monitor only collected a scant amount of data due to insurance allowances and that [employer] apologized for inconvience is willing to place a monitor on free of charge however the same data will more than likely come through and be non beneficial to the pt , daughter understood and advised that she will contact Dr Carrie [name] office with next steps and contact our office with any further questions if noted, pt daughter thanked this nurse for all our help and notes insurance companies have rules and she understands that is out of our hands, she confirmed our office number is another contact needs to be made to our office in the future

## 2013-04-25 ENCOUNTER — Other Ambulatory Visit: Payer: Self-pay

## 2013-05-20 HISTORY — PX: ESOPHAGEAL DILATION: SHX303

## 2013-05-28 ENCOUNTER — Other Ambulatory Visit: Payer: Self-pay | Admitting: Internal Medicine

## 2013-05-28 MED ORDER — PRASUGREL HCL 5 MG PO TABS: 5.0000 mg | ORAL_TABLET | Freq: Every morning | ORAL | Status: DC

## 2013-05-28 NOTE — Telephone Encounter (Signed)
Medication sent via escribe.  

## 2013-05-28 NOTE — Telephone Encounter (Signed)
Received fax refill request  Rx # [id number] Medication:  Effient 5 mg tab Qty 30 Sig:  Take one tablet every morning Physician:  Ladona Ridgel

## 2013-06-03 ENCOUNTER — Ambulatory Visit: Payer: Medicare Other | Admitting: Nurse Practitioner

## 2013-07-09 ENCOUNTER — Telehealth: Payer: Self-pay | Admitting: *Deleted

## 2013-07-09 NOTE — Telephone Encounter (Signed)
Noted pt has never been seen by an MD in the [place] office, pt was in the [place] office to have an event monitor placed on per request from Margette Fast MD, our office does not carry Effient 5mg  samples, only 10mg  that can NOT be cut in half, noted pt was seen in the [place] office in the past, will send notation to [facility] street office to advise per pt has not followed up as advised per discharge notation from [place] with Kathlen Mody on 01-14-13

## 2013-07-09 NOTE — Telephone Encounter (Signed)
Pt needs samples of effient 15mg 

## 2013-07-09 NOTE — Telephone Encounter (Signed)
Spoke to patient's daughter Alexia [place] office out of Effient samples.Stated she wanted Dr.Nahser to know PCP Dr.Zack [place] Crane stopped patient's Xarelto at her office visit in late 12/14.Also patient never received follow up appointment with Dr.Nahser for 10/14.Appointment scheduled with Dr.Nahser 07/31/13 at 10:30 am.Advised she may call office again before appointment to see if we have any Effient samples.

## 2013-07-19 ENCOUNTER — Ambulatory Visit (INDEPENDENT_AMBULATORY_CARE_PROVIDER_SITE_OTHER): Payer: Medicare Other | Admitting: Cardiovascular Disease

## 2013-07-19 ENCOUNTER — Encounter: Payer: Self-pay | Admitting: Cardiovascular Disease

## 2013-07-19 VITALS — BP 136/80 | HR 68 | Ht 65.0 in | Wt 185.0 lb

## 2013-07-19 DIAGNOSIS — I7090 Unspecified atherosclerosis: Secondary | ICD-10-CM

## 2013-07-19 DIAGNOSIS — I2510 Atherosclerotic heart disease of native coronary artery without angina pectoris: Secondary | ICD-10-CM

## 2013-07-19 DIAGNOSIS — I2699 Other pulmonary embolism without acute cor pulmonale: Secondary | ICD-10-CM

## 2013-07-19 NOTE — Progress Notes (Signed)
Carrie [name] Date of Birth  February 26, 1941       Carrie [name] [address], [place], [place]  [address]   [place], [place]  [address] [phone]     [phone]   Fax  [phone]    Fax [phone]  Problem List: 1. pulmonary emboli - presented with syncope,  She was tried on Coumadin but she was not able to regulated and it up having a very major GI bleed. She was p;aced on Xarelto but is not able to afford it.    She has stopped the Xarelto as of 07/19/13.  2. Hyperlipidemia 3. severe asthma 4. CAD - s/p stenting of LAD-  October, 2013, she is intolerant to Aspirin and is a non-responder to Plavix.  She should stay on Effient ( or similar) lifelong. 5. Severe Asthma -   History of Present Illness:  Carrie [name] is a 73 yo who I have seen in the past.  She has been having problems with syncope.  She has a strong family  hx of CAD.  She has been having chest pressure - associated with severe dyspnea.  This occurs in the middle of the night.  It occasionally eases off with Tums or Pepsi.  The pain radiates to her left breast and to her left shoulder blade / left arm.  She has profuse diaphoresis.   These episodes of diaphoresis are followed by syncope.    These episodes of syncope have occurred as much as several times a week.   She was admitted to the hospital recently with an episode of syncope and chest pain.  She was found to have bilateral pulmonary embolus and significant CAD. She had a bare metal stent to the LAD, was treated with Effient.  She was treated with coumadin, and shoulders FE for 3 months. The plan is to reduce the dose of Effient to 5 mg a day after the initial 3 months.  She has had some episodes of chest pain since he left the hospital. Her daughter thinks that there may be due to anxiety. She also has significant asthma and is not able to ambulate mostly because of her asthma. She has a tight asthmatic  cough. The cough seems to have worsened over the past couple of weeks.  She's developed some hypoxia walking back from the front of the office today. Her O2 saturation was 92%.  She denies bleeding.  July 09, 2012: She was admitted to  [facility] for a viral infection resulting is severe asthma.   She has a hx of severe asthma and has had progressive asthma problems.  She has fallen several times and has had lots of bleeding from her wounds on arms and legs.    She also has had some CP and has taken NTG.    Jan. 30, 2015:  Carrie [name]  is seen today for a one-year followup visit. Since that seen her, she was hospitalized for chest pain. Repeat cardiac catheterization in June revealed minor coronary artery regular disease and a minimal amount of in-stent restenosis. She has also been seen by the neurologist for further evaluation of lightheadedness and "spells"  . She has had bilateral carotid duplex scans which revealed mild bilateral carotid irregularities.  She had a  cardiac monitor placed. Unfortunately the monitor only work for one day and she did not have any irregularities.  She was originally told that she would  not have to pay for the monitor but the daughter and the pain the bill when it arrives.  She's been having difficulty paying for her Xarelto.  She also takes Effient  because she does not respond to Plavix and is allergic to aspirin. Therefore, Effient is her only choice.  Her daughter is going to try to get a free supply from [facility].    Current Outpatient Prescriptions on File Prior to Visit  Medication Sig Dispense Refill  . albuterol (PROAIR HFA) 108 (90 BASE) MCG/ACT inhaler Inhale 2 puffs into the lungs every 6 (six) hours as needed. For rescue/shortness of breath      . atorvastatin (LIPITOR) 80 MG tablet Take 1 tablet (80 mg total) by mouth at bedtime.  30 tablet  6  . benzonatate (TESSALON) 100 MG capsule Take 200 mg by mouth 2 (two) times daily.       . bisacodyl  (DULCOLAX) 5 MG EC tablet Take 5 mg by mouth daily as needed for constipation.      . clonazePAM (KLONOPIN) 1 MG tablet Take 1 mg by mouth daily.      [name] donepezil (ARICEPT) 10 MG tablet Take 10 mg by mouth at bedtime as needed.      . Esomeprazole Magnesium (NEXIUM PO) Take 1 capsule by mouth at bedtime.      [name] ezetimibe (ZETIA) 10 MG tablet Take 10 mg by mouth at bedtime.      . fexofenadine (ALLEGRA) 180 MG tablet Take 180 mg by mouth daily.      . fluticasone (FLONASE) 50 MCG/ACT nasal spray Place 2 sprays into the nose 2 (two) times daily.      . furosemide (LASIX) 40 MG tablet Take 40 mg by mouth daily.      [name] HYDROcodone-acetaminophen (NORCO/VICODIN) 5-325 MG per tablet Take 5-325 tablets by mouth.      [name] ipratropium-albuterol (DUONEB) 0.5-2.5 (3) MG/3ML SOLN Take 3 mLs by nebulization every 6 (six) hours as needed. For shortness of breath      . Mometasone Furo-Formoterol Fum (DULERA) 200-5 MCG/ACT AERO Inhale 2 puffs into the lungs 3 (three) times daily.      . montelukast (SINGULAIR) 10 MG tablet Take 10 mg by mouth at bedtime.      . Multiple Vitamin (MULITIVITAMIN WITH MINERALS) TABS Take 1 tablet by mouth every morning.       . potassium chloride SA (K-DUR,KLOR-CON) 20 MEQ tablet Take 20 mEq by mouth 2 (two) times daily.      . prasugrel (EFFIENT) 5 MG TABS tablet Take 1 tablet (5 mg total) by mouth every morning.  30 tablet  6  . Probiotic Product (TRUBIOTICS) CAPS Take 1 capsule by mouth daily.      . traZODone (DESYREL) 50 MG tablet Take 50 mg by mouth at bedtime.      [name] venlafaxine (EFFEXOR-XR) 75 MG 24 hr capsule Take 75 mg by mouth at bedtime. 1 tablets at bedtime       No current facility-administered medications on file prior to visit.    Allergies  Allergen Reactions  . Aspirin Hives  . Metoclopramide Hcl     REACTION: intolerant, restless hands  . Morphine Hives  . Adhesive [Tape] Itching and Rash    ONLY TO USE PAPER TAPE  . Codeine Itching and Rash  .  Penicillins Itching and Rash  . Sulfonamide Derivatives Itching and Rash    Past Medical History  Diagnosis Date  . Hyperlipidemia   .  Anxiety   . Chronic asthma   . Persistent headaches   . PONV (postoperative nausea and vomiting) 1983  . Arteriosclerotic cardiovascular disease (ASCVD)      single vessel CAD involving the mid LAD, s/p BMS to mid LAD 48/1/85, nl LV systolic fxn  . Pulmonary embolism     Rx: lifelong coumadin 03/2012  . Chronic anticoagulation     Warfarin led to variable INRs; now Rx with rivaroxaban  . Carotid stenosis     Carotid US 7/14:  Bilat.  ICA 1-39% => f/u 1 yr.  [name] GERD (gastroesophageal reflux disease)   . Dementia   . CAD (coronary artery disease)   . Cough   . Syncope and collapse   . Memory deficit   . Benign paroxysmal positional vertigo 01/30/2013    Past Surgical History  Procedure Laterality Date  . Appendectomy    . Tubal ligation    . Cystic surgery on breast      bilateral  . Left ankle surgery    . Shoulder surgery  06/2010    bone spurs left  . Coronary stent placement  03/26/2012  . Coronary angioplasty with stent placement    . Breast surgery      History  Smoking status  . Never Smoker   Smokeless tobacco  . Never Used    Comment: exposed to 2nd hand smoke    History  Alcohol Use No    Family History  Problem Relation Age of Onset  . Heart attack Mother   . Breast cancer Mother   . Colon polyps Mother   . Heart attack Father   . Cirrhosis Sister   . Diabetes Sister   . Heart attack Sister   . Heart disease Brother   . Diabetes Brother   . Heart disease Brother   . Ovarian cancer Sister   . Ovarian cancer Sister     Reviw of Systems:  Reviewed in the HPI.  All other systems are negative.  Physical Exam: Blood pressure 136/80, pulse 68, height 5\' 5"  (1.651 m), weight 185 lb (83.915 kg). General: Well developed, well nourished, in no acute distress.  Head: Normocephalic, atraumatic, sclera non-icteric,  mucus membranes are moist,   Neck: Supple. Carotids are 2 + without bruits. No JVD  Lungs: She has a few wheezes with expiration. She states that these wheezes are fairly common for her.  Heart: regular rate.  normal  S1 S2. No murmurs, gallops or rubs.  Abdomen: Soft, non-tender, non-distended with normal bowel sounds. No hepatomegaly. No rebound/guarding. No masses.  Msk:  Strength and tone are normal  Extremities: No clubbing or cyanosis. No edema.  Distal pedal pulses are 2+ and equal bilaterally.  Neuro: Alert and oriented X 3. Moves all extremities spontaneously.  Psych:  Responds to questions appropriately with a normal affect.  ECG: Oct. 1, 2013 - NSR at 74,  Low voltage QRS.  O2 sat after walking was 92%.  Assessment / Plan:

## 2013-07-19 NOTE — Patient Instructions (Signed)
Your physician recommends that you continue on your current medications as directed. Please refer to the Current Medication list given to you today.  Your physician wants you to follow-up in: 6 months. You will receive a reminder letter in the mail two months in advance. If you don't receive a letter, please call our office to schedule the follow-up appointment.  

## 2013-07-19 NOTE — Assessment & Plan Note (Signed)
Carrie [name] is not able to afford her Xarelto.  She failed Coumadin. We tried that in the past and she wound up having a significant GI bleed. She also has to take Effient - she is a Plavix nonresponder and is allergic to aspirin.  We'll continue to follow her. She has stopped her xarelto for now and doesn t want to restart.  I have explained the risk of not being on anticoagulant.

## 2013-07-19 NOTE — Assessment & Plan Note (Signed)
Carrie [name] has had stenting the past. She is allergic to aspirin. She does not respond to Plavix. We'll need to continue with the apnea. Fortunately, she's not having any angina.

## 2013-07-31 ENCOUNTER — Ambulatory Visit: Payer: Medicare Other | Admitting: Cardiovascular Disease

## 2013-08-05 ENCOUNTER — Other Ambulatory Visit: Payer: Self-pay | Admitting: Dermatology

## 2013-08-23 ENCOUNTER — Inpatient Hospital Stay (HOSPITAL_COMMUNITY): Payer: Medicare Other

## 2013-08-23 ENCOUNTER — Inpatient Hospital Stay (HOSPITAL_COMMUNITY)
Admission: EM | Admit: 2013-08-23 | Discharge: 2013-08-28 | DRG: 192 | Disposition: A | Discharge status: Home Health | Payer: Medicare Other | Attending: Internal Medicine | Admitting: Internal Medicine

## 2013-08-23 ENCOUNTER — Encounter (HOSPITAL_COMMUNITY): Payer: Self-pay | Admitting: Emergency Medicine

## 2013-08-23 ENCOUNTER — Emergency Department (HOSPITAL_COMMUNITY): Payer: Medicare Other

## 2013-08-23 DIAGNOSIS — H8110 Benign paroxysmal vertigo, unspecified ear: Secondary | ICD-10-CM

## 2013-08-23 DIAGNOSIS — T50905A Adverse effect of unspecified drugs, medicaments and biological substances, initial encounter: Secondary | ICD-10-CM

## 2013-08-23 DIAGNOSIS — J4489 Other specified chronic obstructive pulmonary disease: Secondary | ICD-10-CM | POA: Diagnosis present

## 2013-08-23 DIAGNOSIS — J449 Chronic obstructive pulmonary disease, unspecified: Secondary | ICD-10-CM | POA: Diagnosis present

## 2013-08-23 DIAGNOSIS — I2510 Atherosclerotic heart disease of native coronary artery without angina pectoris: Secondary | ICD-10-CM

## 2013-08-23 DIAGNOSIS — J209 Acute bronchitis, unspecified: Principal | ICD-10-CM

## 2013-08-23 DIAGNOSIS — E785 Hyperlipidemia, unspecified: Secondary | ICD-10-CM | POA: Diagnosis present

## 2013-08-23 DIAGNOSIS — F329 Major depressive disorder, single episode, unspecified: Secondary | ICD-10-CM

## 2013-08-23 DIAGNOSIS — F419 Anxiety disorder, unspecified: Secondary | ICD-10-CM | POA: Diagnosis present

## 2013-08-23 DIAGNOSIS — Z8249 Family history of ischemic heart disease and other diseases of the circulatory system: Secondary | ICD-10-CM

## 2013-08-23 DIAGNOSIS — I82409 Acute embolism and thrombosis of unspecified deep veins of unspecified lower extremity: Secondary | ICD-10-CM

## 2013-08-23 DIAGNOSIS — K08409 Partial loss of teeth, unspecified cause, unspecified class: Secondary | ICD-10-CM

## 2013-08-23 DIAGNOSIS — K5730 Diverticulosis of large intestine without perforation or abscess without bleeding: Secondary | ICD-10-CM

## 2013-08-23 DIAGNOSIS — F0390 Unspecified dementia without behavioral disturbance: Secondary | ICD-10-CM | POA: Diagnosis present

## 2013-08-23 DIAGNOSIS — J440 Chronic obstructive pulmonary disease with acute lower respiratory infection: Principal | ICD-10-CM | POA: Diagnosis present

## 2013-08-23 DIAGNOSIS — R7309 Other abnormal glucose: Secondary | ICD-10-CM | POA: Diagnosis present

## 2013-08-23 DIAGNOSIS — R55 Syncope and collapse: Secondary | ICD-10-CM

## 2013-08-23 DIAGNOSIS — F32A Depression, unspecified: Secondary | ICD-10-CM

## 2013-08-23 DIAGNOSIS — I7090 Unspecified atherosclerosis: Secondary | ICD-10-CM

## 2013-08-23 DIAGNOSIS — J441 Chronic obstructive pulmonary disease with (acute) exacerbation: Secondary | ICD-10-CM

## 2013-08-23 DIAGNOSIS — Z9861 Coronary angioplasty status: Secondary | ICD-10-CM

## 2013-08-23 DIAGNOSIS — Z833 Family history of diabetes mellitus: Secondary | ICD-10-CM

## 2013-08-23 DIAGNOSIS — J45909 Unspecified asthma, uncomplicated: Secondary | ICD-10-CM

## 2013-08-23 DIAGNOSIS — Z79899 Other long term (current) drug therapy: Secondary | ICD-10-CM

## 2013-08-23 DIAGNOSIS — Z8041 Family history of malignant neoplasm of ovary: Secondary | ICD-10-CM

## 2013-08-23 DIAGNOSIS — T380X5A Adverse effect of glucocorticoids and synthetic analogues, initial encounter: Secondary | ICD-10-CM | POA: Diagnosis present

## 2013-08-23 DIAGNOSIS — Z803 Family history of malignant neoplasm of breast: Secondary | ICD-10-CM

## 2013-08-23 DIAGNOSIS — Z86718 Personal history of other venous thrombosis and embolism: Secondary | ICD-10-CM

## 2013-08-23 DIAGNOSIS — Z86711 Personal history of pulmonary embolism: Secondary | ICD-10-CM

## 2013-08-23 DIAGNOSIS — R739 Hyperglycemia, unspecified: Secondary | ICD-10-CM

## 2013-08-23 DIAGNOSIS — J310 Chronic rhinitis: Secondary | ICD-10-CM

## 2013-08-23 DIAGNOSIS — K219 Gastro-esophageal reflux disease without esophagitis: Secondary | ICD-10-CM

## 2013-08-23 DIAGNOSIS — R1314 Dysphagia, pharyngoesophageal phase: Secondary | ICD-10-CM

## 2013-08-23 DIAGNOSIS — Z7901 Long term (current) use of anticoagulants: Secondary | ICD-10-CM

## 2013-08-23 DIAGNOSIS — R079 Chest pain, unspecified: Secondary | ICD-10-CM

## 2013-08-23 DIAGNOSIS — F411 Generalized anxiety disorder: Secondary | ICD-10-CM | POA: Diagnosis present

## 2013-08-23 HISTORY — DX: Chronic obstructive pulmonary disease, unspecified: J44.9

## 2013-08-23 HISTORY — DX: Melanoma in situ of other part of trunk: D03.59

## 2013-08-23 LAB — BASIC METABOLIC PANEL
BUN: 12 mg/dL (ref 6–23)
CO2: 29 meq/L (ref 19–32)
Calcium: 9.3 mg/dL (ref 8.4–10.5)
Chloride: 103 mEq/L (ref 96–112)
Creatinine, Ser: 0.77 mg/dL (ref 0.50–1.10)
GFR calc non Af Amer: 82 mL/min — ABNORMAL LOW (ref >90)
Glucose, Bld: 105 mg/dL — ABNORMAL HIGH (ref 70–99)
POTASSIUM: 4 meq/L (ref 3.7–5.3)
Sodium: 143 mEq/L (ref 137–147)

## 2013-08-23 LAB — CBC WITH DIFFERENTIAL/PLATELET
Basophils Absolute: 0 10*3/uL (ref 0.0–0.1)
Basophils Relative: 0 % (ref 0–1)
EOS PCT: 1 % (ref 0–5)
Eosinophils Absolute: 0 10*3/uL (ref 0.0–0.7)
HCT: 40.6 % (ref 36.0–46.0)
Hemoglobin: 12.9 g/dL (ref 12.0–15.0)
LYMPHS ABS: 2.4 10*3/uL (ref 0.7–4.0)
Lymphocytes Relative: 29 % (ref 12–46)
MCH: 28.9 pg (ref 26.0–34.0)
MCHC: 31.8 g/dL (ref 30.0–36.0)
MCV: 91 fL (ref 78.0–100.0)
MONO ABS: 0.9 10*3/uL (ref 0.1–1.0)
Monocytes Relative: 11 % (ref 3–12)
NEUTROS ABS: 5 10*3/uL (ref 1.7–7.7)
Neutrophils Relative %: 60 % (ref 43–77)
Platelets: 285 10*3/uL (ref 150–400)
RBC: 4.46 MIL/uL (ref 3.87–5.11)
RDW: 14.8 % (ref 11.5–15.5)
WBC: 8.4 10*3/uL (ref 4.0–10.5)

## 2013-08-23 MED ORDER — IPRATROPIUM-ALBUTEROL 0.5-2.5 (3) MG/3ML IN SOLN
3.0000 mL | Freq: Once | RESPIRATORY_TRACT | Status: AC
  Administered 2013-08-23: 3 mL via RESPIRATORY_TRACT
  Filled 2013-08-23: qty 3

## 2013-08-23 MED ORDER — ADULT MULTIVITAMIN W/MINERALS CH
1.0000 | ORAL_TABLET | Freq: Every morning | ORAL | Status: DC
  Administered 2013-08-24 – 2013-08-28 (×5): 1 via ORAL
  Filled 2013-08-23 (×5): qty 1

## 2013-08-23 MED ORDER — DOCUSATE SODIUM 100 MG PO CAPS
100.0000 mg | ORAL_CAPSULE | Freq: Two times a day (BID) | ORAL | Status: DC
  Administered 2013-08-23 – 2013-08-28 (×10): 100 mg via ORAL
  Filled 2013-08-23 (×10): qty 1

## 2013-08-23 MED ORDER — TRAMADOL HCL 50 MG PO TABS
100.0000 mg | ORAL_TABLET | Freq: Four times a day (QID) | ORAL | Status: DC | PRN
  Administered 2013-08-23 – 2013-08-26 (×5): 100 mg via ORAL
  Filled 2013-08-23 (×5): qty 2

## 2013-08-23 MED ORDER — ACETAMINOPHEN 325 MG PO TABS
650.0000 mg | ORAL_TABLET | Freq: Four times a day (QID) | ORAL | Status: DC | PRN
  Administered 2013-08-23 – 2013-08-27 (×4): 650 mg via ORAL
  Filled 2013-08-23 (×4): qty 2

## 2013-08-23 MED ORDER — PRASUGREL HCL 10 MG PO TABS
5.0000 mg | ORAL_TABLET | Freq: Every morning | ORAL | Status: DC
  Administered 2013-08-24 – 2013-08-28 (×5): 5 mg via ORAL
  Filled 2013-08-23 (×8): qty 1

## 2013-08-23 MED ORDER — PRASUGREL HCL 5 MG PO TABS
5.0000 mg | ORAL_TABLET | Freq: Every morning | ORAL | Status: DC
  Filled 2013-08-23 (×3): qty 1

## 2013-08-23 MED ORDER — ACETAMINOPHEN 650 MG RE SUPP: 650.0000 mg | Freq: Four times a day (QID) | RECTAL | Status: DC | PRN

## 2013-08-23 MED ORDER — IPRATROPIUM BROMIDE 0.02 % IN SOLN: 0.5000 mg | RESPIRATORY_TRACT | Status: DC

## 2013-08-23 MED ORDER — FUROSEMIDE 40 MG PO TABS
40.0000 mg | ORAL_TABLET | Freq: Every day | ORAL | Status: DC
  Administered 2013-08-24 – 2013-08-28 (×5): 40 mg via ORAL
  Filled 2013-08-23 (×6): qty 1

## 2013-08-23 MED ORDER — PANTOPRAZOLE SODIUM 40 MG PO TBEC
40.0000 mg | DELAYED_RELEASE_TABLET | Freq: Every day | ORAL | Status: DC
  Administered 2013-08-23 – 2013-08-25 (×3): 40 mg via ORAL
  Filled 2013-08-23 (×3): qty 1

## 2013-08-23 MED ORDER — ALBUTEROL SULFATE (2.5 MG/3ML) 0.083% IN NEBU
2.5000 mg | INHALATION_SOLUTION | Freq: Once | RESPIRATORY_TRACT | Status: AC
  Administered 2013-08-23: 2.5 mg via RESPIRATORY_TRACT
  Filled 2013-08-23: qty 3

## 2013-08-23 MED ORDER — MOMETASONE FURO-FORMOTEROL FUM 200-5 MCG/ACT IN AERO
2.0000 | INHALATION_SPRAY | Freq: Three times a day (TID) | RESPIRATORY_TRACT | Status: DC
  Administered 2013-08-23 – 2013-08-25 (×4): 2 via RESPIRATORY_TRACT

## 2013-08-23 MED ORDER — METHYLPREDNISOLONE SODIUM SUCC 125 MG IJ SOLR
60.0000 mg | Freq: Four times a day (QID) | INTRAMUSCULAR | Status: DC
  Administered 2013-08-23 – 2013-08-27 (×15): 60 mg via INTRAVENOUS
  Filled 2013-08-23 (×16): qty 2

## 2013-08-23 MED ORDER — DONEPEZIL HCL 5 MG PO TABS
10.0000 mg | ORAL_TABLET | Freq: Every day | ORAL | Status: DC
  Administered 2013-08-23 – 2013-08-27 (×5): 10 mg via ORAL
  Filled 2013-08-23 (×5): qty 2

## 2013-08-23 MED ORDER — CLONAZEPAM 0.5 MG PO TABS
1.0000 mg | ORAL_TABLET | ORAL | Status: DC
  Administered 2013-08-23 – 2013-08-26 (×6): 1 mg via ORAL
  Filled 2013-08-23 (×5): qty 2

## 2013-08-23 MED ORDER — ACETAMINOPHEN 500 MG PO TABS
1000.0000 mg | ORAL_TABLET | Freq: Once | ORAL | Status: AC
  Administered 2013-08-23: 1000 mg via ORAL

## 2013-08-23 MED ORDER — ALBUTEROL SULFATE (2.5 MG/3ML) 0.083% IN NEBU: 2.5000 mg | INHALATION_SOLUTION | RESPIRATORY_TRACT | Status: DC

## 2013-08-23 MED ORDER — TRAZODONE HCL 50 MG PO TABS
50.0000 mg | ORAL_TABLET | Freq: Every day | ORAL | Status: DC
  Administered 2013-08-23 – 2013-08-27 (×5): 50 mg via ORAL
  Filled 2013-08-23 (×5): qty 1

## 2013-08-23 MED ORDER — ACETAMINOPHEN 500 MG PO TABS
ORAL_TABLET | ORAL | Status: AC
  Filled 2013-08-23: qty 2

## 2013-08-23 MED ORDER — AZELASTINE HCL 0.1 % NA SOLN
1.0000 | Freq: Two times a day (BID) | NASAL | Status: DC
  Administered 2013-08-24 – 2013-08-28 (×8): 1 via NASAL
  Filled 2013-08-23: qty 30

## 2013-08-23 MED ORDER — FLUTICASONE PROPIONATE 50 MCG/ACT NA SUSP
2.0000 | Freq: Two times a day (BID) | NASAL | Status: DC
  Administered 2013-08-24 – 2013-08-28 (×8): 2 via NASAL
  Filled 2013-08-23: qty 16

## 2013-08-23 MED ORDER — SODIUM CHLORIDE 0.9 % IV BOLUS (SEPSIS)
500.0000 mL | Freq: Once | INTRAVENOUS | Status: AC
  Administered 2013-08-23: 500 mL via INTRAVENOUS

## 2013-08-23 MED ORDER — POTASSIUM CHLORIDE IN NACL 20-0.9 MEQ/L-% IV SOLN
INTRAVENOUS | Status: DC
  Administered 2013-08-23 – 2013-08-25 (×3): via INTRAVENOUS

## 2013-08-23 MED ORDER — LORATADINE 10 MG PO TABS
10.0000 mg | ORAL_TABLET | Freq: Every day | ORAL | Status: DC
  Administered 2013-08-24 – 2013-08-28 (×5): 10 mg via ORAL
  Filled 2013-08-23 (×5): qty 1

## 2013-08-23 MED ORDER — POTASSIUM CHLORIDE CRYS ER 20 MEQ PO TBCR
20.0000 meq | EXTENDED_RELEASE_TABLET | Freq: Two times a day (BID) | ORAL | Status: DC
  Administered 2013-08-23 – 2013-08-28 (×10): 20 meq via ORAL
  Filled 2013-08-23 (×10): qty 1

## 2013-08-23 MED ORDER — BENZONATATE 100 MG PO CAPS
200.0000 mg | ORAL_CAPSULE | Freq: Three times a day (TID) | ORAL | Status: DC
  Administered 2013-08-23 – 2013-08-28 (×15): 200 mg via ORAL
  Filled 2013-08-23 (×16): qty 2

## 2013-08-23 MED ORDER — ONDANSETRON HCL 4 MG/2ML IJ SOLN
4.0000 mg | Freq: Four times a day (QID) | INTRAMUSCULAR | Status: DC | PRN
  Administered 2013-08-25: 4 mg via INTRAVENOUS
  Filled 2013-08-23: qty 2

## 2013-08-23 MED ORDER — ATORVASTATIN CALCIUM 40 MG PO TABS
80.0000 mg | ORAL_TABLET | Freq: Every day | ORAL | Status: DC
  Administered 2013-08-23 – 2013-08-27 (×5): 80 mg via ORAL
  Filled 2013-08-23 (×5): qty 2

## 2013-08-23 MED ORDER — ONDANSETRON HCL 4 MG PO TABS: 4.0000 mg | ORAL_TABLET | Freq: Four times a day (QID) | ORAL | Status: DC | PRN

## 2013-08-23 MED ORDER — IPRATROPIUM-ALBUTEROL 0.5-2.5 (3) MG/3ML IN SOLN
3.0000 mL | RESPIRATORY_TRACT | Status: DC
  Administered 2013-08-23 – 2013-08-27 (×20): 3 mL via RESPIRATORY_TRACT
  Filled 2013-08-23 (×19): qty 3

## 2013-08-23 MED ORDER — RISAQUAD PO CAPS
1.0000 | ORAL_CAPSULE | Freq: Every day | ORAL | Status: DC
  Administered 2013-08-24 – 2013-08-28 (×5): 1 via ORAL
  Filled 2013-08-23 (×5): qty 1

## 2013-08-23 MED ORDER — ENOXAPARIN SODIUM 40 MG/0.4ML ~~LOC~~ SOLN
40.0000 mg | SUBCUTANEOUS | Status: DC
  Administered 2013-08-23 – 2013-08-27 (×5): 40 mg via SUBCUTANEOUS
  Filled 2013-08-23 (×5): qty 0.4

## 2013-08-23 MED ORDER — LEVOFLOXACIN IN D5W 500 MG/100ML IV SOLN
500.0000 mg | Freq: Once | INTRAVENOUS | Status: AC
  Administered 2013-08-23: 500 mg via INTRAVENOUS
  Filled 2013-08-23: qty 100

## 2013-08-23 MED ORDER — CLONAZEPAM 0.5 MG PO TABS
1.0000 mg | ORAL_TABLET | Freq: Two times a day (BID) | ORAL | Status: DC
  Filled 2013-08-23: qty 2

## 2013-08-23 MED ORDER — HYDROCOD POLST-CHLORPHEN POLST 10-8 MG/5ML PO LQCR
5.0000 mL | Freq: Once | ORAL | Status: AC
  Administered 2013-08-23: 5 mL via ORAL
  Filled 2013-08-23: qty 5

## 2013-08-23 MED ORDER — HYDROCOD POLST-CHLORPHEN POLST 10-8 MG/5ML PO LQCR
5.0000 mL | Freq: Two times a day (BID) | ORAL | Status: DC
  Administered 2013-08-23 – 2013-08-28 (×10): 5 mL via ORAL
  Filled 2013-08-23 (×10): qty 5

## 2013-08-23 MED ORDER — ALUM & MAG HYDROXIDE-SIMETH 200-200-20 MG/5ML PO SUSP
30.0000 mL | Freq: Four times a day (QID) | ORAL | Status: DC | PRN
  Administered 2013-08-25: 30 mL via ORAL
  Filled 2013-08-23: qty 30

## 2013-08-23 MED ORDER — LEVOFLOXACIN IN D5W 500 MG/100ML IV SOLN
500.0000 mg | INTRAVENOUS | Status: DC
  Administered 2013-08-24 – 2013-08-27 (×4): 500 mg via INTRAVENOUS
  Filled 2013-08-23 (×6): qty 100

## 2013-08-23 MED ORDER — METHYLPREDNISOLONE SODIUM SUCC 125 MG IJ SOLR
125.0000 mg | Freq: Once | INTRAMUSCULAR | Status: AC
  Administered 2013-08-23: 125 mg via INTRAVENOUS
  Filled 2013-08-23: qty 2

## 2013-08-23 MED ORDER — EZETIMIBE 10 MG PO TABS
10.0000 mg | ORAL_TABLET | Freq: Every day | ORAL | Status: DC
  Administered 2013-08-23 – 2013-08-27 (×5): 10 mg via ORAL
  Filled 2013-08-23 (×5): qty 1

## 2013-08-23 MED ORDER — MONTELUKAST SODIUM 10 MG PO TABS
10.0000 mg | ORAL_TABLET | Freq: Every day | ORAL | Status: DC
  Administered 2013-08-23 – 2013-08-27 (×5): 10 mg via ORAL
  Filled 2013-08-23 (×5): qty 1

## 2013-08-23 NOTE — ED Notes (Signed)
Pt reports no relief.  Still c/o coughing and chest tightness.  edp notified orders received.

## 2013-08-23 NOTE — ED Notes (Signed)
Called report to floor. Dr Caryn Section at bedside and reported wanted pt to have [place]. [place] notified and reported would be at bedside to complete scan prior to pt being taken up to floor. Receiving RN aware and verbalized understanding.

## 2013-08-23 NOTE — ED Notes (Signed)
Pt c/o nonprod cough x 2 days and sob. No resp distress noted. Has been taking nebs and inhalers at home with no help per pt. "pain in my bones from arthritis" at present that is chronic. Laying high in bed to breathe per pt. Nondiaphoretic.

## 2013-08-23 NOTE — ED Provider Notes (Signed)
CSN: [id number]     Arrival date & time 08/23/13  [id number] History   First MD Initiated Contact with Patient 08/23/13 [phone]    This chart was scribed for Nat Christen, MD by Terressa Koyanagi, ED Scribe. This patient was seen in room APA05/APA05 and the patient's care was started at 9:29 AM.  Chief Complaint  Patient presents with  . Shortness of Breath  . Cough   Level 5 Caveat   Patient is a 73 y.o. female presenting with cough. The history is provided by a relative and the patient. No language interpreter was used.  Cough  HPI Comments: CAMYIAH HEIM is a 73 y.o. female, with dementia, hyperlipidemia and history of chronic asthma, who presents to the Emergency Department complaining of intermittent shortness of breath onset three to four days ago. Pt's daughter states pt complains of associated wheezing, coughing, loss of appetite and body aches. Pt's daughter reports that pt has been taking her nebulizer and inhalers at home without relief. Pt's daughter further states that pt's O2 levels are checked at home and have been dropping to the 80s over the past couple of days.   PC- Dr. Delphina Cahill  Past Medical History  Diagnosis Date  . Hyperlipidemia   . Anxiety   . Chronic asthma   . Persistent headaches   . PONV (postoperative nausea and vomiting) 1983  . Arteriosclerotic cardiovascular disease (ASCVD)      single vessel CAD involving the mid LAD, s/p BMS to mid LAD [id number], nl LV systolic fxn  . Pulmonary embolism     Rx: lifelong coumadin 03/2012  . Chronic anticoagulation     Warfarin led to variable INRs; now Rx with rivaroxaban  . Carotid stenosis     Carotid US 7/14:  Bilat.  ICA 1-39% => f/u 1 yr.  [name] GERD (gastroesophageal reflux disease)   . Dementia   . CAD (coronary artery disease)   . Cough   . Syncope and collapse   . Memory deficit   . Benign paroxysmal positional vertigo 01/30/2013   Past Surgical History  Procedure Laterality Date  . Appendectomy    . Tubal ligation     . Cystic surgery on breast      bilateral  . Left ankle surgery    . Shoulder surgery  06/2010    bone spurs left  . Coronary stent placement  03/26/2012  . Coronary angioplasty with stent placement    . Breast surgery     Family History  Problem Relation Age of Onset  . Heart attack Mother   . Breast cancer Mother   . Colon polyps Mother   . Heart attack Father   . Cirrhosis Sister   . Diabetes Sister   . Heart attack Sister   . Heart disease Brother   . Diabetes Brother   . Heart disease Brother   . Ovarian cancer Sister   . Ovarian cancer Sister    History  Substance Use Topics  . Smoking status: Never Smoker   . Smokeless tobacco: Never Used     Comment: exposed to 2nd hand smoke  . Alcohol Use: No   OB History   Grav Para Term Preterm Abortions TAB SAB Ect Mult Living                 Review of Systems  Unable to perform ROS: Dementia   Allergies  Aspirin; Metoclopramide hcl; Morphine; Adhesive; Codeine; Penicillins; and Sulfonamide derivatives  Home Medications  Current Outpatient Rx  Name  Route  Sig  Dispense  Refill  . albuterol (PROAIR HFA) 108 (90 BASE) MCG/ACT inhaler   Inhalation   Inhale 2 puffs into the lungs every 6 (six) hours as needed. For rescue/shortness of breath         . atorvastatin (LIPITOR) 80 MG tablet   Oral   Take 1 tablet (80 mg total) by mouth at bedtime.   30 tablet   6   . azelastine (ASTELIN) 137 MCG/SPRAY nasal spray   Each Nare   Place 1 spray into both nostrils 2 (two) times daily. Use in each nostril as directed         . benzonatate (TESSALON) 100 MG capsule   Oral   Take 200 mg by mouth 3 (three) times daily.          . bisacodyl (DULCOLAX) 5 MG EC tablet   Oral   Take 5 mg by mouth daily as needed for constipation.         [name] donepezil (ARICEPT) 10 MG tablet   Oral   Take 10 mg by mouth at bedtime.          . Esomeprazole Magnesium (NEXIUM PO)   Oral   Take 1 capsule by mouth at bedtime.          [name] ezetimibe (ZETIA) 10 MG tablet   Oral   Take 10 mg by mouth at bedtime.         . fexofenadine (ALLEGRA) 180 MG tablet   Oral   Take 180 mg by mouth daily.         . fluticasone (FLONASE) 50 MCG/ACT nasal spray   Nasal   Place 2 sprays into the nose 2 (two) times daily.         . furosemide (LASIX) 40 MG tablet   Oral   Take 40 mg by mouth daily.         [name] HYDROcodone-homatropine (HYCODAN) 5-1.5 MG/5ML syrup   Oral   Take 5 mLs by mouth 3 times/day as needed-between meals & bedtime for cough.         . Ipratropium-Albuterol (COMBIVENT RESPIMAT) 20-100 MCG/ACT AERS respimat   Inhalation   Inhale 1 puff into the lungs every 6 (six) hours.         [name] ipratropium-albuterol (DUONEB) 0.5-2.5 (3) MG/3ML SOLN   Nebulization   Take 3 mLs by nebulization every 4 (four) hours as needed. For shortness of breath         . Mometasone Furo-Formoterol Fum (DULERA) 200-5 MCG/ACT AERO   Inhalation   Inhale 2 puffs into the lungs 3 (three) times daily.         . montelukast (SINGULAIR) 10 MG tablet   Oral   Take 10 mg by mouth at bedtime.         . Multiple Vitamin (MULITIVITAMIN WITH MINERALS) TABS   Oral   Take 1 tablet by mouth every morning.          . pantoprazole (PROTONIX) 40 MG tablet   Oral   Take 1 tablet by mouth daily.         . potassium chloride SA (K-DUR,KLOR-CON) 20 MEQ tablet   Oral   Take 20 mEq by mouth 2 (two) times daily.         . prasugrel (EFFIENT) 5 MG TABS tablet   Oral   Take 1 tablet (5 mg total) by mouth every morning.  30 tablet   6   . Probiotic Product (TRUBIOTICS) CAPS   Oral   Take 1 capsule by mouth daily.         . traMADol (ULTRAM) 50 MG tablet   Oral   Take 2 tablets by mouth every 6 (six) hours as needed for moderate pain.          . traZODone (DESYREL) 50 MG tablet   Oral   Take 50 mg by mouth at bedtime.          Vital Signs: BP 134/65  Pulse 95  Temp(Src) 99 F (37.2 C) (Oral)  Resp 32   Ht 5\' 6"  (1.676 m)  Wt 182 lb (82.555 kg)  BMI 29.39 kg/m2  SpO2 94% Physical Exam  Nursing note and vitals reviewed. Constitutional: She is oriented to person, place, and time. She appears well-developed and well-nourished.  HENT:  Head: Normocephalic and atraumatic.  Eyes: Conjunctivae and EOM are normal. Pupils are equal, round, and reactive to light.  Neck: Normal range of motion. Neck supple.  Cardiovascular: Normal rate, regular rhythm and normal heart sounds.   Pulmonary/Chest: She has wheezes.  Constitutional Cough  Abdominal: Soft. Bowel sounds are normal.  Musculoskeletal: Normal range of motion.  Neurological: She is alert and oriented to person, place, and time.  Skin: Skin is warm and dry.  Psychiatric:  Mild Dementia    ED Course  Procedures (including critical care time) DIAGNOSTIC STUDIES: Oxygen Saturation is 94% on RA, low by my interpretation.    COORDINATION OF CARE:  9:32 AM-Discussed treatment plan which includes breathing treatment, CXR, and likely admission with pt's daughter and pt at bedside and pt's daughter and pt agreed to plan.   Results for orders placed during the hospital encounter of 44/01/02  BASIC METABOLIC PANEL      Result Value Ref Range   Sodium 143  137 - 147 mEq/L   Potassium 4.0  3.7 - 5.3 mEq/L   Chloride 103  96 - 112 mEq/L   CO2 29  19 - 32 mEq/L   Glucose, Bld 105 (*) 70 - 99 mg/dL   BUN 12  6 - 23 mg/dL   Creatinine, Ser 0.77  0.50 - 1.10 mg/dL   Calcium 9.3  8.4 - 10.5 mg/dL   GFR calc non Af Amer 82 (*) >90 mL/min   GFR calc Af Amer >90  >90 mL/min  CBC WITH DIFFERENTIAL      Result Value Ref Range   WBC 8.4  4.0 - 10.5 K/uL   RBC 4.46  3.87 - 5.11 MIL/uL   Hemoglobin 12.9  12.0 - 15.0 g/dL   HCT 40.6  36.0 - 46.0 %   MCV 91.0  78.0 - 100.0 fL   MCH 28.9  26.0 - 34.0 pg   MCHC 31.8  30.0 - 36.0 g/dL   RDW 14.8  11.5 - 15.5 %   Platelets 285  150 - 400 K/uL   Neutrophils Relative % 60  43 - 77 %   Neutro Abs 5.0   1.7 - 7.7 K/uL   Lymphocytes Relative 29  12 - 46 %   Lymphs Abs 2.4  0.7 - 4.0 K/uL   Monocytes Relative 11  3 - 12 %   Monocytes Absolute 0.9  0.1 - 1.0 K/uL   Eosinophils Relative 1  0 - 5 %   Eosinophils Absolute 0.0  0.0 - 0.7 K/uL   Basophils Relative 0  0 - 1 %  Basophils Absolute 0.0  0.0 - 0.1 K/uL   Results for orders placed during the hospital encounter of [id number]  BASIC METABOLIC PANEL      Result Value Ref Range   Sodium 143  137 - 147 mEq/L   Potassium 4.0  3.7 - 5.3 mEq/L   Chloride 103  96 - 112 mEq/L   CO2 29  19 - 32 mEq/L   Glucose, Bld 105 (*) 70 - 99 mg/dL   BUN 12  6 - 23 mg/dL   Creatinine, Ser 0.77  0.50 - 1.10 mg/dL   Calcium 9.3  8.4 - 10.5 mg/dL   GFR calc non Af Amer 82 (*) >90 mL/min   GFR calc Af Amer >90  >90 mL/min  CBC WITH DIFFERENTIAL      Result Value Ref Range   WBC 8.4  4.0 - 10.5 K/uL   RBC 4.46  3.87 - 5.11 MIL/uL   Hemoglobin 12.9  12.0 - 15.0 g/dL   HCT 40.6  36.0 - 46.0 %   MCV 91.0  78.0 - 100.0 fL   MCH 28.9  26.0 - 34.0 pg   MCHC 31.8  30.0 - 36.0 g/dL   RDW 14.8  11.5 - 15.5 %   Platelets 285  150 - 400 K/uL   Neutrophils Relative % 60  43 - 77 %   Neutro Abs 5.0  1.7 - 7.7 K/uL   Lymphocytes Relative 29  12 - 46 %   Lymphs Abs 2.4  0.7 - 4.0 K/uL   Monocytes Relative 11  3 - 12 %   Monocytes Absolute 0.9  0.1 - 1.0 K/uL   Eosinophils Relative 1  0 - 5 %   Eosinophils Absolute 0.0  0.0 - 0.7 K/uL   Basophils Relative 0  0 - 1 %   Basophils Absolute 0.0  0.0 - 0.1 K/uL   Results for orders placed during the hospital encounter of [id number]  BASIC METABOLIC PANEL      Result Value Ref Range   Sodium 143  137 - 147 mEq/L   Potassium 4.0  3.7 - 5.3 mEq/L   Chloride 103  96 - 112 mEq/L   CO2 29  19 - 32 mEq/L   Glucose, Bld 105 (*) 70 - 99 mg/dL   BUN 12  6 - 23 mg/dL   Creatinine, Ser 0.77  0.50 - 1.10 mg/dL   Calcium 9.3  8.4 - 10.5 mg/dL   GFR calc non Af Amer 82 (*) >90 mL/min   GFR calc Af Amer >90  >90 mL/min   CBC WITH DIFFERENTIAL      Result Value Ref Range   WBC 8.4  4.0 - 10.5 K/uL   RBC 4.46  3.87 - 5.11 MIL/uL   Hemoglobin 12.9  12.0 - 15.0 g/dL   HCT 40.6  36.0 - 46.0 %   MCV 91.0  78.0 - 100.0 fL   MCH 28.9  26.0 - 34.0 pg   MCHC 31.8  30.0 - 36.0 g/dL   RDW 14.8  11.5 - 15.5 %   Platelets 285  150 - 400 K/uL   Neutrophils Relative % 60  43 - 77 %   Neutro Abs 5.0  1.7 - 7.7 K/uL   Lymphocytes Relative 29  12 - 46 %   Lymphs Abs 2.4  0.7 - 4.0 K/uL   Monocytes Relative 11  3 - 12 %   Monocytes Absolute 0.9  0.1 - 1.0 K/uL  Eosinophils Relative 1  0 - 5 %   Eosinophils Absolute 0.0  0.0 - 0.7 K/uL   Basophils Relative 0  0 - 1 %   Basophils Absolute 0.0  0.0 - 0.1 K/uL    EKG Interpretation None      MDM   Final diagnoses:  COPD exacerbation   COPD exacerbation not improving with home treatment. Rx IV Soluedrol,  Albuterol and Atrovent, IV antibiotics. Admit to Dr. Caryn Section  I personally performed the services described in this documentation, which was scribed in my presence. The recorded information has been reviewed and is accurate.     Nat Christen, MD 08/23/13 1245

## 2013-08-23 NOTE — H&P (Signed)
[facility] History and Physical  Carrie [name] [id number] DOB: Oct 22, 1940 DOA: 08/23/2013  Referring physician: ED physician, Dr. Lacinda Axon PCP: Delphina Cahill, MD  Pulmonologist: Dr. Soledad Gerlach at [facility]  Chief Complaint: Cough and shortness of breath  HPI: Carrie [name] is a 73 y.o. female with a history of intrinsic asthma, coronary artery disease, dementia, and chronic anxiety, who presents to the emergency department with a chief complaint of shortness of breath and cough. Her symptoms started 2-3 days ago. For shortness of breath occurs at rest and mostly with activity. She has some shortness of breath when she lays flat. She denies abdominal swelling or swelling in her legs, but she has had some achiness in her left greater than right thigh, the similar location when she had her previous DVT. She has had a nonproductive cough which is worse at night. She has had chest congestion and wheezing. She has had a subjective fever, but no chills. She denies chest pain other than when she coughs. She denies pleurisy. She denies nausea, vomiting, or diarrhea. She has had mild nasal congestion. She denies outright body aches, but says that her "bones hurt", which is not new. She has been using her nebulizers and inhalers more frequently, but without relief. Her daughter checked her oxygen saturation when she bent over and it was 88% at home. She is not on supplemental oxygen. She has a history of pulmonary embolism, now off of Coumadin but on Effient because she had severe bruising with Coumadin.   In the emergency department, she is currently less short of breath. Her oxygen saturation is 93% on 2 L of nasal cannula oxygen. She is afebrile and hemodynamically stable. Her chest x-ray reveals mild left base atelectasis, but no edema or consolidation. EKG not done but will be ordered. Her lab data are virtually within normal limits. She is being admitted for further evaluation  and management.    Review of Systems:  Constitutional:  No weight loss, night sweats.  HEENT:  No headaches, Difficulty swallowing,Tooth/dental problems  No sneezing, itching, ear ache,  Cardio-vascular:  No chest pain,, swelling in lower extremities, anasarca, dizziness, palpitations  GI:  No heartburn, indigestion, abdominal pain, nausea, vomiting, diarrhea, change in bowel habits, loss of appetite  Resp:  No coughing up of blood.No change in color of mucus.No wheezing.No chest wall deformity  Skin:  no rash or lesions.  GU:  no dysuria, change in color of urine, no urgency or frequency. No flank pain.  Musculoskeletal:   Psych:  No change in mood or affect. No depression or anxiety.   Past Medical History  Diagnosis Date  . Hyperlipidemia   . Anxiety   . Chronic asthma   . Persistent headaches   . PONV (postoperative nausea and vomiting) 1983  . Arteriosclerotic cardiovascular disease (ASCVD)      single vessel CAD involving the mid LAD, s/p BMS to mid LAD [id number], nl LV systolic fxn  . Pulmonary embolism     Rx: lifelong coumadin 03/2012  . Chronic anticoagulation     Warfarin led to variable INRs; now Rx with rivaroxaban  . Carotid stenosis     Carotid US 7/14:  Bilat.  ICA 1-39% => f/u 1 yr.  [name] GERD (gastroesophageal reflux disease)   . Dementia   . CAD (coronary artery disease)   . Cough   . Syncope and collapse   . Memory deficit   . Benign paroxysmal positional vertigo 01/30/2013  Past Surgical History  Procedure Laterality Date  . Appendectomy    . Tubal ligation    . Cystic surgery on breast      bilateral  . Left ankle surgery    . Shoulder surgery  06/2010    bone spurs left  . Coronary stent placement  03/26/2012  . Coronary angioplasty with stent placement    . Breast surgery     Social History: She is widowed. She had 2 children but her son died. She currently lives with her daughter, [name]. She denies tobacco, alcohol, and illicit drug use.  She no longer drives. She ambulates independently.  Allergies  Allergen Reactions  . Aspirin Hives  . Metoclopramide Hcl     REACTION: intolerant, restless hands  . Morphine Hives  . Adhesive [Tape] Itching and Rash    ONLY TO USE PAPER TAPE  . Codeine Itching and Rash  . Penicillins Itching and Rash  . Sulfonamide Derivatives Itching and Rash    Family History  Problem Relation Age of Onset  . Heart attack Mother   . Breast cancer Mother   . Colon polyps Mother   . Heart attack Father   . Cirrhosis Sister   . Diabetes Sister   . Heart attack Sister   . Heart disease Brother   . Diabetes Brother   . Heart disease Brother   . Ovarian cancer Sister   . Ovarian cancer Sister      Prior to Admission medications   Medication Sig Start Date End Date Taking? Authorizing Provider  albuterol (PROAIR HFA) 108 (90 BASE) MCG/ACT inhaler Inhale 2 puffs into the lungs every 6 (six) hours as needed. For rescue/shortness of breath   Yes Historical Provider, MD  atorvastatin (LIPITOR) 80 MG tablet Take 1 tablet (80 mg total) by mouth at bedtime. 11/29/12  Yes Thayer Headings, MD  azelastine (ASTELIN) 137 MCG/SPRAY nasal spray Place 1 spray into both nostrils 2 (two) times daily. Use in each nostril as directed   Yes Historical Provider, MD  benzonatate (TESSALON) 100 MG capsule Take 200 mg by mouth 3 (three) times daily.    Yes Historical Provider, MD  bisacodyl (DULCOLAX) 5 MG EC tablet Take 5 mg by mouth daily as needed for constipation.   Yes Historical Provider, MD  donepezil (ARICEPT) 10 MG tablet Take 10 mg by mouth at bedtime.    Yes Historical Provider, MD  Esomeprazole Magnesium (NEXIUM PO) Take 1 capsule by mouth at bedtime.   Yes Historical Provider, MD  ezetimibe (ZETIA) 10 MG tablet Take 10 mg by mouth at bedtime.   Yes Historical Provider, MD  fexofenadine (ALLEGRA) 180 MG tablet Take 180 mg by mouth daily.   Yes Historical Provider, MD  fluticasone (FLONASE) 50 MCG/ACT nasal  spray Place 2 sprays into the nose 2 (two) times daily.   Yes Historical Provider, MD  furosemide (LASIX) 40 MG tablet Take 40 mg by mouth daily.   Yes Historical Provider, MD  HYDROcodone-homatropine (HYCODAN) 5-1.5 MG/5ML syrup Take 5 mLs by mouth 3 times/day as needed-between meals & bedtime for cough.   Yes Historical Provider, MD  Ipratropium-Albuterol (COMBIVENT RESPIMAT) 20-100 MCG/ACT AERS respimat Inhale 1 puff into the lungs every 6 (six) hours.   Yes Historical Provider, MD  ipratropium-albuterol (DUONEB) 0.5-2.5 (3) MG/3ML SOLN Take 3 mLs by nebulization every 4 (four) hours as needed. For shortness of breath   Yes Historical Provider, MD  Mometasone Furo-Formoterol Fum (DULERA) 200-5 MCG/ACT AERO Inhale 2  puffs into the lungs 3 (three) times daily.   Yes Historical Provider, MD  montelukast (SINGULAIR) 10 MG tablet Take 10 mg by mouth at bedtime.   Yes Historical Provider, MD  Multiple Vitamin (MULITIVITAMIN WITH MINERALS) TABS Take 1 tablet by mouth every morning.    Yes Historical Provider, MD  pantoprazole (PROTONIX) 40 MG tablet Take 1 tablet by mouth daily. 08/07/13  Yes Historical Provider, MD  potassium chloride SA (K-DUR,KLOR-CON) 20 MEQ tablet Take 20 mEq by mouth 2 (two) times daily.   Yes Historical Provider, MD  prasugrel (EFFIENT) 5 MG TABS tablet Take 1 tablet (5 mg total) by mouth every morning. 05/28/13  Yes Evans Lance, MD  Probiotic Product (TRUBIOTICS) CAPS Take 1 capsule by mouth daily.   Yes Historical Provider, MD  traMADol (ULTRAM) 50 MG tablet Take 2 tablets by mouth every 6 (six) hours as needed for moderate pain.  07/29/13  Yes Historical Provider, MD  traZODone (DESYREL) 50 MG tablet Take 50 mg by mouth at bedtime.   Yes Historical Provider, MD   Physical Exam: Filed Vitals:   08/23/13 1259  BP:   Pulse:   Temp: 99.2 F (37.3 C)  Resp:     BP 109/59  Pulse 89  Temp(Src) 99.2 F (37.3 C) (Oral)  Resp 18  Ht 5\' 6"  (1.676 m)  Wt 82.555 kg (182 lb)   BMI 29.39 kg/m2  SpO2 93%  General:  Appears calm and comfortable Eyes: PERRL, normal lids, irises & conjunctiva ENT: grossly normal hearing, lips & tongue Neck: no LAD, masses or thyromegaly Cardiovascular: S1, S2, with a soft systolic murmur No LE edema. Telemetry: Not on  Respiratory: Breathing is currently nonlabored, status post several nebulizer treatments. Faint scattered expiratory wheezes bilaterally. Abdomen: soft, ntnd Skin: no rash or induration seen on limited exam Musculoskeletal: grossly normal tone BUE/BLE; mild left greater than right thigh tenderness, with no acute erythema or acute hot red joints. Psychiatric: grossly normal mood and affect, speech fluent and appropriate Neurologic: grossly non-focal.          Labs on Admission:  Basic Metabolic Panel:  Recent Labs Lab 08/23/13 1000  NA 143  K 4.0  CL 103  CO2 29  GLUCOSE 105*  BUN 12  CREATININE 0.77  CALCIUM 9.3   Liver Function Tests: No results found for this basename: AST, ALT, ALKPHOS, BILITOT, PROT, ALBUMIN,  in the last 168 hours No results found for this basename: LIPASE, AMYLASE,  in the last 168 hours No results found for this basename: AMMONIA,  in the last 168 hours CBC:  Recent Labs Lab 08/23/13 1000  WBC 8.4  NEUTROABS 5.0  HGB 12.9  HCT 40.6  MCV 91.0  PLT 285   Cardiac Enzymes: No results found for this basename: CKTOTAL, CKMB, CKMBINDEX, TROPONINI,  in the last 168 hours  BNP (last 3 results) No results found for this basename: PROBNP,  in the last 8760 hours CBG: No results found for this basename: GLUCAP,  in the last 168 hours  Radiological Exams on Admission: Dg Chest 2 View  08/23/2013   CLINICAL DATA:  Shortness of breath and cough  EXAM: CHEST  2 VIEW  COMPARISON:  December 05, 2012  FINDINGS: There is minimal atelectasis in the left base. Lungs are otherwise clear. Heart size and pulmonary vascularity are normal. No adenopathy. No bone lesions.  IMPRESSION: No edema or  consolidation.  Minimal left base atelectasis.   Electronically Signed   By: Gwyndolyn Saxon  Jasmine December M.D.   On: 08/23/2013 09:47    EKG: Independently reviewed. Pending.  Assessment/Plan Principal Problem:   Acute bronchitis Active Problems:   Intrinsic asthma, unspecified   Arteriosclerotic cardiovascular disease (ASCVD)   Gastroesophageal reflux   Chronic anxiety   History of pulmonary embolism   1. This is a 73 year old woman with a history of intrinsic asthma, who presents with symptomatology consistent with acute bronchitis. She has no specific chest pain other than chest wall pain when coughing. She does have a history of pulmonary embolism, but she is oxygenating above 90% on room air and at approximately 93-95% with oxygen. She denies pleurisy. However, she has some bilateral thigh pain, and therefore, bilateral lower extremity venous ultrasound will be ordered to rule out DVT. She is symptomatically improved with treatment given in the ED. We'll admit her for further evaluation and management.     Plan: 1. The patient was given 125 mg of Solu-Medrol, Levaquin, and albuterol/Atrovent nebulizers in the emergency department. 2. We'll continue Solu-Medrol at 60 mg IV every 6 hours. 3. Albuterol and Atrovent nebulizers every 4 hours. 4. Levaquin 500 mg IV daily. 5. Symptomatic treatment with oxygen, Tessalon Perles, and Tussionex. 6. Gentle IV fluid hydration. 7. We'll start sliding scale NovoLog empirically for anticipation of steroid-induced hyperglycemia. 8. We'll check the results of the bilateral lower extremity venous ultrasound pending.  Code Status: Full code Family Communication: Discussed with the patient's daughter, Ms. Gelene Mink Disposition Plan: Anticipate discharge to home in 2-3 days.  Time spent: One hour.  [facility] Pager [phone]

## 2013-08-24 DIAGNOSIS — R739 Hyperglycemia, unspecified: Secondary | ICD-10-CM | POA: Diagnosis not present

## 2013-08-24 DIAGNOSIS — T50905A Adverse effect of unspecified drugs, medicaments and biological substances, initial encounter: Secondary | ICD-10-CM

## 2013-08-24 DIAGNOSIS — R7309 Other abnormal glucose: Secondary | ICD-10-CM

## 2013-08-24 DIAGNOSIS — T50904A Poisoning by unspecified drugs, medicaments and biological substances, undetermined, initial encounter: Secondary | ICD-10-CM

## 2013-08-24 LAB — BASIC METABOLIC PANEL
BUN: 10 mg/dL (ref 6–23)
CHLORIDE: 106 meq/L (ref 96–112)
CO2: 26 meq/L (ref 19–32)
CREATININE: 0.58 mg/dL (ref 0.50–1.10)
Calcium: 9.2 mg/dL (ref 8.4–10.5)
GFR calc Af Amer: >90 mL/min (ref >90)
GFR calc non Af Amer: 90 mL/min — ABNORMAL LOW (ref >90)
GLUCOSE: 169 mg/dL — AB (ref 70–99)
POTASSIUM: 4.2 meq/L (ref 3.7–5.3)
Sodium: 143 mEq/L (ref 137–147)

## 2013-08-24 LAB — GLUCOSE, CAPILLARY
Glucose-Capillary: 155 mg/dL — ABNORMAL HIGH (ref 70–99)
Glucose-Capillary: 163 mg/dL — ABNORMAL HIGH (ref 70–99)

## 2013-08-24 LAB — CBC
HCT: 36.4 % (ref 36.0–46.0)
HEMOGLOBIN: 11.5 g/dL — AB (ref 12.0–15.0)
MCH: 28.9 pg (ref 26.0–34.0)
MCHC: 31.6 g/dL (ref 30.0–36.0)
MCV: 91.5 fL (ref 78.0–100.0)
Platelets: 269 10*3/uL (ref 150–400)
RBC: 3.98 MIL/uL (ref 3.87–5.11)
RDW: 15.1 % (ref 11.5–15.5)
WBC: 9.6 10*3/uL (ref 4.0–10.5)

## 2013-08-24 MED ORDER — INSULIN ASPART 100 UNIT/ML ~~LOC~~ SOLN
0.0000 [IU] | Freq: Three times a day (TID) | SUBCUTANEOUS | Status: DC
  Administered 2013-08-24 – 2013-08-25 (×2): 3 [IU] via SUBCUTANEOUS
  Administered 2013-08-25 – 2013-08-27 (×4): 2 [IU] via SUBCUTANEOUS

## 2013-08-24 MED ORDER — INSULIN DETEMIR 100 UNIT/ML ~~LOC~~ SOLN
7.0000 [IU] | Freq: Two times a day (BID) | SUBCUTANEOUS | Status: DC
  Administered 2013-08-24 – 2013-08-28 (×9): 7 [IU] via SUBCUTANEOUS
  Filled 2013-08-24 (×11): qty 0.07

## 2013-08-24 MED ORDER — INSULIN ASPART 100 UNIT/ML ~~LOC~~ SOLN
0.0000 [IU] | Freq: Every day | SUBCUTANEOUS | Status: DC
Start: 2013-08-24 — End: 2013-08-28

## 2013-08-24 MED ORDER — ALBUTEROL SULFATE (2.5 MG/3ML) 0.083% IN NEBU
2.5000 mg | INHALATION_SOLUTION | RESPIRATORY_TRACT | Status: DC | PRN
  Filled 2013-08-24: qty 3

## 2013-08-24 NOTE — Progress Notes (Signed)
Utilization review Completed Lashay Osborne RN BSN   

## 2013-08-24 NOTE — Progress Notes (Signed)
PT still has cough sputum is clear, thick. Question remnants of upper respiratory virus ?

## 2013-08-24 NOTE — Progress Notes (Signed)
[facility] PROGRESS NOTE  Carrie [name] NAT:[id number] DOB: 1941/06/09 DOA: 08/23/2013 PCP: Delphina Cahill, MD    Code Status: Full code Family Communication: Discussed with daughter Disposition Plan: Anticipate discharge to home when clinically appropriate   Consultants:  None  Procedures:  None  Antibiotics:  Levaquin 08/23/13>>>  HPI/Subjective: The patient has chest congestion and shortness of breath with minimal effort. She denies chest pain  Objective: Filed Vitals:   08/24/13 0403  BP: 141/83  Pulse: 77  Temp: 97.6 F (36.4 C)  Resp: 18   Oxygen saturation 96% on 2 L of supplemental oxygen.   Intake/Output Summary (Last 24 hours) at 08/24/13 1201 Last data filed at 08/24/13 0900  Gross per 24 hour  Intake   1106 ml  Output    400 ml  Net    706 ml   Filed Weights   08/23/13 0926  Weight: 82.555 kg (182 lb)    Exam:   General:  73 year-old sitting up in bed, in no acute distress.  Cardiovascular: S1, S2, with a soft systolic murmur.  Respiratory: Expiratory wheezes noted in all lung lobes  Abdomen: Positive bowel sounds, soft, nontender, nondistended.  Musculoskeletal: No acute hot red joints.  Neurologic: She is alert and oriented x2. Cranial nerves II through XII are intact. She is mildly anxious.  Data Reviewed: Basic Metabolic Panel:  Recent Labs Lab 08/23/13 1000 08/24/13 0547  NA 143 143  K 4.0 4.2  CL 103 106  CO2 29 26  GLUCOSE 105* 169*  BUN 12 10  CREATININE 0.77 0.58  CALCIUM 9.3 9.2   Liver Function Tests: No results found for this basename: AST, ALT, ALKPHOS, BILITOT, PROT, ALBUMIN,  in the last 168 hours No results found for this basename: LIPASE, AMYLASE,  in the last 168 hours No results found for this basename: AMMONIA,  in the last 168 hours CBC:  Recent Labs Lab 08/23/13 1000 08/24/13 0547  WBC 8.4 9.6  NEUTROABS 5.0  --   HGB 12.9 11.5*  HCT 40.6 36.4  MCV 91.0 91.5  PLT 285 269   Cardiac  Enzymes: No results found for this basename: CKTOTAL, CKMB, CKMBINDEX, TROPONINI,  in the last 168 hours BNP (last 3 results) No results found for this basename: PROBNP,  in the last 8760 hours CBG: No results found for this basename: GLUCAP,  in the last 168 hours  No results found for this or any previous visit (from the past 240 hour(s)).   Studies: Dg Chest 2 View  08/23/2013   CLINICAL DATA:  Shortness of breath and cough  EXAM: CHEST  2 VIEW  COMPARISON:  December 05, 2012  FINDINGS: There is minimal atelectasis in the left base. Lungs are otherwise clear. Heart size and pulmonary vascularity are normal. No adenopathy. No bone lesions.  IMPRESSION: No edema or consolidation.  Minimal left base atelectasis.   Electronically Signed   By: Lowella Grip M.D.   On: 08/23/2013 09:47   US Venous Img Lower Bilateral  08/23/2013   CLINICAL DATA:  73 year old female with lower extremity pain. History of pulmonary embolus. Initial encounter.  EXAM: BILATERAL LOWER EXTREMITY VENOUS DOPPLER ULTRASOUND  TECHNIQUE: Gray-scale sonography with graded compression, as well as color Doppler and duplex ultrasound, were performed to evaluate the deep venous system from the level of the common femoral vein through the popliteal and proximal calf veins. Spectral Doppler was utilized to evaluate flow at rest and with distal augmentation maneuvers.  COMPARISON:  Left lower extremity study 12/05/2012 and earlier.  FINDINGS: Thrombus within deep veins:  None visualized.  Compressibility of deep veins:  Normal.  Duplex waveform respiratory phasicity:  Normal.  Duplex waveform response to augmentation:  Normal.  Venous reflux:  None visualized.  Other findings: Both greater saphenous veins appear patent. The left demonstrates tortuous varicosities (image 77).  IMPRESSION: No evidence of  1.  No evidence of bilateral lower extremity deep venous thrombosis. 2. Left lower extremity venous varicosities. lower extremity deep venous  thrombosis.   Electronically Signed   By: Lars Pinks M.D.   On: 08/23/2013 14:48    Scheduled Meds: . acidophilus  1 capsule Oral Daily  . atorvastatin  80 mg Oral QHS  . azelastine  1 spray Each Nare BID  . benzonatate  200 mg Oral TID  . chlorpheniramine-HYDROcodone  5 mL Oral Q12H  . clonazePAM  1 mg Oral 2 times per day  . docusate sodium  100 mg Oral BID  . donepezil  10 mg Oral QHS  . enoxaparin (LOVENOX) injection  40 mg Subcutaneous Q24H  . ezetimibe  10 mg Oral QHS  . fluticasone  2 spray Each Nare BID  . furosemide  40 mg Oral Daily  . ipratropium-albuterol  3 mL Nebulization Q4H  . levofloxacin (LEVAQUIN) IV  500 mg Intravenous Q24H  . loratadine  10 mg Oral Daily  . methylPREDNISolone (SOLU-MEDROL) injection  60 mg Intravenous Q6H  . mometasone-formoterol  2 puff Inhalation TID  . montelukast  10 mg Oral QHS  . multivitamin with minerals  1 tablet Oral q morning - 10a  . pantoprazole  40 mg Oral QHS  . potassium chloride SA  20 mEq Oral BID  . prasugrel  5 mg Oral q morning - 10a  . traZODone  50 mg Oral QHS   Continuous Infusions: . 0.9 % NaCl with KCl 20 mEq / L 60 mL/hr at 08/23/13 1534   Assessment: Principal Problem:   Acute bronchitis Active Problems:   Intrinsic asthma, unspecified   Arteriosclerotic cardiovascular disease (ASCVD)   Gastroesophageal reflux   Chronic anxiety   History of pulmonary embolism   1. Acute bronchitis in the setting of chronic intrinsic asthma. She is stable with no significant improvement in her symptoms. We'll continue IV Solu-Medrol, dual nebs, Levaquin, Tessalon Perles, and Tussionex. We'll continue Singulair, Claritin and Dulera.  History of pulmonary embolism/DVT. Lower extremity venous Doppler bilaterally revealed no DVT. The patient has no chest pain. Continue antiplatelet therapy.  ASCVD/CAD. Currently stable. Continue statin, Lasix, Effient.  Chronic anxiety. Continue Klonopin and trazodone.  Gastroesophageal  reflux disease. Continue PPI.  Mild dementia. Continue Aricept.  Hyperglycemia, steroid induced. We'll start sliding scale NovoLog.     Plan: 1. Continue current management. 2. Add sliding scale NovoLog and Levemir for steroid-induced hyperglycemia.  Time spent: 35 minutes.    [facility] Pager [phone]. If 7PM-7AM, please contact night-coverage at [email or web address], password [facility] 08/24/2013, 12:01 PM  LOS: 1 day

## 2013-08-25 DIAGNOSIS — K219 Gastro-esophageal reflux disease without esophagitis: Secondary | ICD-10-CM

## 2013-08-25 DIAGNOSIS — J441 Chronic obstructive pulmonary disease with (acute) exacerbation: Secondary | ICD-10-CM

## 2013-08-25 LAB — GLUCOSE, CAPILLARY
GLUCOSE-CAPILLARY: 166 mg/dL — AB (ref 70–99)
Glucose-Capillary: 125 mg/dL — ABNORMAL HIGH (ref 70–99)
Glucose-Capillary: 122 mg/dL — ABNORMAL HIGH (ref 70–99)
Glucose-Capillary: 148 mg/dL — ABNORMAL HIGH (ref 70–99)

## 2013-08-25 MED ORDER — ENSURE COMPLETE PO LIQD
237.0000 mL | Freq: Two times a day (BID) | ORAL | Status: DC
  Administered 2013-08-25 – 2013-08-27 (×5): 237 mL via ORAL

## 2013-08-25 NOTE — Progress Notes (Signed)
[facility] PROGRESS NOTE  Carrie [name] RJJ:[id number] DOB: 01/08/1941 DOA: 08/23/2013 PCP: Delphina Cahill, MD    Code Status: Full code Family Communication: Discussed with daughter Disposition Plan: Anticipate discharge to home when clinically appropriate   Consultants:  None  Procedures:  None  Antibiotics:  Levaquin 08/23/13>>>  HPI/Subjective: The patient has persistent coughing, but the [employer] and Tussionex are helping. She is now starting to have a productive cough with clear to light yellow sputum. She denies chest pain  Objective: Filed Vitals:   08/25/13 0503  BP: 117/69  Pulse: 65  Temp: 97.2 F (36.2 C)  Resp: 16   Oxygen saturation 96% on 2 L of supplemental oxygen.   Intake/Output Summary (Last 24 hours) at 08/25/13 1243 Last data filed at 08/25/13 0800  Gross per 24 hour  Intake   1316 ml  Output   1000 ml  Net    316 ml   Filed Weights   08/23/13 0926  Weight: 82.555 kg (182 lb)    Exam:   General:  73 year-old sitting up in bed, in no acute distress.  Cardiovascular: S1, S2, with a soft systolic murmur.  Respiratory: Fewer expiratory wheezes per auscultation today. Breathing nonlabored at rest.  Abdomen: Positive bowel sounds, soft, nontender, nondistended.  Musculoskeletal: No acute hot red joints.  Neurologic: She is alert and oriented x2. Cranial nerves II through XII are intact. She is mildly anxious.  Data Reviewed: Basic Metabolic Panel:  Recent Labs Lab 08/23/13 1000 08/24/13 0547  NA 143 143  K 4.0 4.2  CL 103 106  CO2 29 26  GLUCOSE 105* 169*  BUN 12 10  CREATININE 0.77 0.58  CALCIUM 9.3 9.2   Liver Function Tests: No results found for this basename: AST, ALT, ALKPHOS, BILITOT, PROT, ALBUMIN,  in the last 168 hours No results found for this basename: LIPASE, AMYLASE,  in the last 168 hours No results found for this basename: AMMONIA,  in the last 168 hours CBC:  Recent Labs Lab 08/23/13 1000  08/24/13 0547  WBC 8.4 9.6  NEUTROABS 5.0  --   HGB 12.9 11.5*  HCT 40.6 36.4  MCV 91.0 91.5  PLT 285 269   Cardiac Enzymes: No results found for this basename: CKTOTAL, CKMB, CKMBINDEX, TROPONINI,  in the last 168 hours BNP (last 3 results) No results found for this basename: PROBNP,  in the last 8760 hours CBG:  Recent Labs Lab 08/24/13 1824 08/24/13 2018 08/25/13 0823 08/25/13 1141  GLUCAP 155* 163* 125* 166*    No results found for this or any previous visit (from the past 240 hour(s)).   Studies: US Venous Img Lower Bilateral  08/23/2013   CLINICAL DATA:  73 year old female with lower extremity pain. History of pulmonary embolus. Initial encounter.  EXAM: BILATERAL LOWER EXTREMITY VENOUS DOPPLER ULTRASOUND  TECHNIQUE: Gray-scale sonography with graded compression, as well as color Doppler and duplex ultrasound, were performed to evaluate the deep venous system from the level of the common femoral vein through the popliteal and proximal calf veins. Spectral Doppler was utilized to evaluate flow at rest and with distal augmentation maneuvers.  COMPARISON:  Left lower extremity study 12/05/2012 and earlier.  FINDINGS: Thrombus within deep veins:  None visualized.  Compressibility of deep veins:  Normal.  Duplex waveform respiratory phasicity:  Normal.  Duplex waveform response to augmentation:  Normal.  Venous reflux:  None visualized.  Other findings: Both greater saphenous veins appear patent. The left demonstrates tortuous varicosities (  image 77).  IMPRESSION: No evidence of  1.  No evidence of bilateral lower extremity deep venous thrombosis. 2. Left lower extremity venous varicosities. lower extremity deep venous thrombosis.   Electronically Signed   By: Lars Pinks M.D.   On: 08/23/2013 14:48    Scheduled Meds: . acidophilus  1 capsule Oral Daily  . atorvastatin  80 mg Oral QHS  . azelastine  1 spray Each Nare BID  . benzonatate  200 mg Oral TID  .  chlorpheniramine-HYDROcodone  5 mL Oral Q12H  . clonazePAM  1 mg Oral 2 times per day  . docusate sodium  100 mg Oral BID  . donepezil  10 mg Oral QHS  . enoxaparin (LOVENOX) injection  40 mg Subcutaneous Q24H  . ezetimibe  10 mg Oral QHS  . feeding supplement (ENSURE COMPLETE)  237 mL Oral BID BM  . fluticasone  2 spray Each Nare BID  . furosemide  40 mg Oral Daily  . insulin aspart  0-15 Units Subcutaneous TID WC  . insulin aspart  0-5 Units Subcutaneous QHS  . insulin detemir  7 Units Subcutaneous BID  . ipratropium-albuterol  3 mL Nebulization Q4H  . levofloxacin (LEVAQUIN) IV  500 mg Intravenous Q24H  . loratadine  10 mg Oral Daily  . methylPREDNISolone (SOLU-MEDROL) injection  60 mg Intravenous Q6H  . mometasone-formoterol  2 puff Inhalation TID  . montelukast  10 mg Oral QHS  . multivitamin with minerals  1 tablet Oral q morning - 10a  . pantoprazole  40 mg Oral QHS  . potassium chloride SA  20 mEq Oral BID  . prasugrel  5 mg Oral q morning - 10a  . traZODone  50 mg Oral QHS   Continuous Infusions: . 0.9 % NaCl with KCl 20 mEq / L 60 mL/hr at 08/25/13 1242   Assessment: Principal Problem:   Acute bronchitis Active Problems:   Intrinsic asthma, unspecified   Arteriosclerotic cardiovascular disease (ASCVD)   Gastroesophageal reflux   Chronic anxiety   History of pulmonary embolism   Hyperglycemia, drug-induced   1. Acute bronchitis in the setting of chronic intrinsic asthma. She is stable. There has been slight improvement in her symptoms, but she continues to have a persistent cough.Burnis Medin continue IV Solu-Medrol,  duonebs, Levaquin, Tessalon Perles, and Tussionex. We'll continue Singulair, Claritin and Dulera.  History of pulmonary embolism/DVT. Lower extremity venous Doppler bilaterally revealed no DVT. The patient has no chest pain. Continue antiplatelet therapy.  ASCVD/CAD. Currently stable. Continue statin, Lasix, Effient.  Chronic anxiety. Continue Klonopin  and trazodone.  Gastroesophageal reflux disease. Continue PPI.  Mild dementia. Continue Aricept.  Hyperglycemia, steroid induced. Controlled on sliding scale NovoLog and Levemir.  Reported coughing after eating. Her diet was changed to dysphagia 2 empirically. We'll as the speech therapist to evaluate for dysphagia.     Plan: 1. Continue current management. 2. Add sliding scale NovoLog and Levemir for steroid-induced hyperglycemia. 3. Will ask the nursing staff to ambulate patient with assistance. 4. Speech therapy evaluation of her swallowing, to rule out dysphagia.  Time spent: 30 minutes.    [place] [facility] Pager [phone]. If 7PM-7AM, please contact night-coverage at [email or web address], password [facility] 08/25/2013, 12:43 PM  LOS: 2 days

## 2013-08-26 LAB — BASIC METABOLIC PANEL
BUN: 18 mg/dL (ref 6–23)
CHLORIDE: 104 meq/L (ref 96–112)
CO2: 30 mEq/L (ref 19–32)
Calcium: 9 mg/dL (ref 8.4–10.5)
Creatinine, Ser: 0.62 mg/dL (ref 0.50–1.10)
GFR calc non Af Amer: 88 mL/min — ABNORMAL LOW (ref >90)
Glucose, Bld: 137 mg/dL — ABNORMAL HIGH (ref 70–99)
Potassium: 4.8 mEq/L (ref 3.7–5.3)
SODIUM: 143 meq/L (ref 137–147)

## 2013-08-26 LAB — GLUCOSE, CAPILLARY
GLUCOSE-CAPILLARY: 123 mg/dL — AB (ref 70–99)
GLUCOSE-CAPILLARY: 124 mg/dL — AB (ref 70–99)
Glucose-Capillary: 122 mg/dL — ABNORMAL HIGH (ref 70–99)
Glucose-Capillary: 87 mg/dL (ref 70–99)

## 2013-08-26 MED ORDER — LORAZEPAM 0.5 MG PO TABS
0.5000 mg | ORAL_TABLET | Freq: Two times a day (BID) | ORAL | Status: DC
  Administered 2013-08-26 – 2013-08-28 (×4): 0.5 mg via ORAL
  Filled 2013-08-26 (×4): qty 1

## 2013-08-26 MED ORDER — MAGNESIUM HYDROXIDE 400 MG/5ML PO SUSP
30.0000 mL | Freq: Once | ORAL | Status: AC
  Administered 2013-08-26: 30 mL via ORAL
  Filled 2013-08-26: qty 30

## 2013-08-26 MED ORDER — PANTOPRAZOLE SODIUM 40 MG PO TBEC
40.0000 mg | DELAYED_RELEASE_TABLET | Freq: Every morning | ORAL | Status: DC
  Administered 2013-08-26 – 2013-08-28 (×3): 40 mg via ORAL
  Filled 2013-08-26 (×3): qty 1

## 2013-08-26 MED ORDER — PANTOPRAZOLE SODIUM 40 MG PO TBEC
80.0000 mg | DELAYED_RELEASE_TABLET | Freq: Every day | ORAL | Status: DC
Start: 2013-08-26 — End: 2013-08-28
  Administered 2013-08-26 – 2013-08-27 (×2): 80 mg via ORAL
  Filled 2013-08-26 (×2): qty 2

## 2013-08-26 NOTE — Plan of Care (Signed)
Problem: Phase III Progression Outcomes Goal: Activity at appropriate level-compared to baseline (UP IN CHAIR FOR HEMODIALYSIS)  Outcome: Completed/Met Date Met:  08/26/13 Out of bed to chair today.     

## 2013-08-26 NOTE — Progress Notes (Signed)
[facility] PROGRESS NOTE  Carrie [name] WCB:[id number] DOB: 08-02-1940 DOA: 08/23/2013 PCP: Delphina Cahill, MD  Assessment/Plan: 1. Acute bronchitis in the setting of chronic intrinsic asthma. Little improvement this am but remains stable. Continues with persistent cough.Burnis Medin continue IV Solu-Medrol at current dose as well as duonebs, Levaquin (will change to po), Tessalon Perles, and Tussionex. We'll continue Singulair, Claritin and Dulera. Oxygen saturation level 93-96% on room air. Will ambulate and monitor. Pt reports recent visit to pulmonologist where he commented "home oxygen is not far off".  History of pulmonary embolism/DVT. Lower extremity venous Doppler bilaterally revealed no DVT. The patient has no chest pain. Continue antiplatelet therapy.   ASCVD/CAD. Currently stable. Continue statin, Lasix, Effient.   Chronic anxiety. Appears stable at baseline.  Continue Klonopin and trazodone.   Gastroesophageal reflux disease. Stable at baseline.  Continue PPI.   Mild dementia. Stable at baseline.  Continue Aricept.   Hyperglycemia, steroid induced. CBG range 122-148.  Controlled on sliding scale NovoLog and Levemir.   Reported coughing after eating. Her diet was changed to dysphagia 2 empirically. Await speech therapist to evaluate for dysphagia. Patient reports remote hx of esophageal dilation. Has gradually developed symptoms over last 2 years with worsening over last 4 months.    Code Status: full Family Communication: daughter at bedside Disposition Plan: home with daughter when ready   Consultants:  none  Procedures:  none  Antibiotics:  levaquin 08/23/13>>  HPI/Subjective: Up in chair. Reports only slight improvement with breathing. Cough more productive. Reports "flutter helps".  Objective: Filed Vitals:   08/26/13 0427  BP: 122/71  Pulse: 85  Temp: 98.6 F (37 C)  Resp: 20    Intake/Output Summary (Last 24 hours) at 08/26/13 1010 Last data filed at  08/26/13 0435  Gross per 24 hour  Intake    240 ml  Output      3 ml  Net    237 ml   Filed Weights   08/23/13 0926  Weight: 82.555 kg (182 lb)    Exam:   General:  Sitting up in chair NAD  Cardiovascular: S1 and S2 +murmur. No gallup or rub noted. No LE edema  Respiratory: mild increased work of breathing with conversation. BS decreased throughout particularly on right. Slight wheeze anterior.  Abdomen: soft +BS non-tender to palpation no guarding   Musculoskeletal: no clubbing or cyanosis. Joints without swelling or erythema.    Data Reviewed: Basic Metabolic Panel:  Recent Labs Lab 08/23/13 1000 08/24/13 0547 08/26/13 0813  NA 143 143 143  K 4.0 4.2 4.8  CL 103 106 104  CO2 29 26 30   GLUCOSE 105* 169* 137*  BUN 12 10 18   CREATININE 0.77 0.58 0.62  CALCIUM 9.3 9.2 9.0   Liver Function Tests: No results found for this basename: AST, ALT, ALKPHOS, BILITOT, PROT, ALBUMIN,  in the last 168 hours No results found for this basename: LIPASE, AMYLASE,  in the last 168 hours No results found for this basename: AMMONIA,  in the last 168 hours CBC:  Recent Labs Lab 08/23/13 1000 08/24/13 0547  WBC 8.4 9.6  NEUTROABS 5.0  --   HGB 12.9 11.5*  HCT 40.6 36.4  MCV 91.0 91.5  PLT 285 269   Cardiac Enzymes: No results found for this basename: CKTOTAL, CKMB, CKMBINDEX, TROPONINI,  in the last 168 hours BNP (last 3 results) No results found for this basename: PROBNP,  in the last 8760 hours CBG:  Recent Labs Lab 08/25/13 [phone]  08/25/13 1141 08/25/13 1626 08/25/13 2015 08/26/13 0717  GLUCAP 125* 166* 122* 148* 122*    No results found for this or any previous visit (from the past 240 hour(s)).   Studies: No results found.  Scheduled Meds: . acidophilus  1 capsule Oral Daily  . atorvastatin  80 mg Oral QHS  . azelastine  1 spray Each Nare BID  . benzonatate  200 mg Oral TID  . chlorpheniramine-HYDROcodone  5 mL Oral Q12H  . clonazePAM  1 mg Oral 2  times per day  . docusate sodium  100 mg Oral BID  . donepezil  10 mg Oral QHS  . enoxaparin (LOVENOX) injection  40 mg Subcutaneous Q24H  . ezetimibe  10 mg Oral QHS  . feeding supplement (ENSURE COMPLETE)  237 mL Oral BID BM  . fluticasone  2 spray Each Nare BID  . furosemide  40 mg Oral Daily  . insulin aspart  0-15 Units Subcutaneous TID WC  . insulin aspart  0-5 Units Subcutaneous QHS  . insulin detemir  7 Units Subcutaneous BID  . ipratropium-albuterol  3 mL Nebulization Q4H  . levofloxacin (LEVAQUIN) IV  500 mg Intravenous Q24H  . loratadine  10 mg Oral Daily  . methylPREDNISolone (SOLU-MEDROL) injection  60 mg Intravenous Q6H  . mometasone-formoterol  2 puff Inhalation TID  . montelukast  10 mg Oral QHS  . multivitamin with minerals  1 tablet Oral q morning - 10a  . pantoprazole  40 mg Oral QHS  . potassium chloride SA  20 mEq Oral BID  . prasugrel  5 mg Oral q morning - 10a  . traZODone  50 mg Oral QHS   Continuous Infusions: . 0.9 % NaCl with KCl 20 mEq / L 20 mL/hr at 08/26/13 9150    Principal Problem:   Acute bronchitis Active Problems:   Intrinsic asthma, unspecified   Gastroesophageal reflux   Arteriosclerotic cardiovascular disease (ASCVD)   Chronic anxiety   History of pulmonary embolism   Hyperglycemia, drug-induced    Time spent: 30 minutes    [place] [facility] Pager [phone]. If 7PM-7AM, please contact night-coverage at [email or web address], password [facility] 08/26/2013, 10:10 AM  LOS: 3 days

## 2013-08-26 NOTE — Progress Notes (Signed)
Pt did well with pureed tray. Pt and family report less coughing with eating.

## 2013-08-26 NOTE — Progress Notes (Addendum)
The patient was seen and examined. She was discussed with nurse practitioner, Ms. Renard Hamper. Agree with her assessment and plan, with additions. Per my exam, the patient continues to have scattered  wheezes/bronchospasms, possibly slightly better today compared to yesterday. She continues to have coughing jags which could be exacerbated by drinking liquids primarily and secondarily hard foods. She appears to do okay with soft foods like mashed potatoes and putting. She had, what appears to be, an esophageal dilatation over a decade ago. She does not endorse food getting stuck in her throat. Speech therapy evaluation has been ordered. We'll make the patient n.p.o. for now and/or until the recommendation by the speech therapist.  Also, per patient and daughter request, will stop clonazepam and restart lorazepam which she had been on in the past but it made her too sleepy. The daughter thinks that the dose was too high. Therefore, we'll start Ativan at 0.5 mg twice a day. Also, the patient chronically takes Protonix in the morning and Nexium at bedtime. Here, she is only on Protonix each bedtime. We'll change the dosing to Protonix 40 mg in the morning and 80 mg at bedtime.  We'll order PT consultation per daughter request.  Will wean steroids down tomorrow.

## 2013-08-26 NOTE — Care Management Note (Addendum)
    Page 1 of 1   08/28/2013     11:44:22 AM   CARE MANAGEMENT NOTE 08/28/2013  Patient:  Carrie [name], Carrie [name]   Account Number:  [id number]  Date Initiated:  08/26/2013  Documentation initiated by:  Theophilus Kinds  Subjective/Objective Assessment:   Pt admitted from home with bronchitis. Pt lives with her daughter and will return home at discharge. Pt has a cane, walker and lift chair for home use. Pt is fairly independent with ADL's but has fallen alot recently.     Action/Plan:   Pt would like HH RN and PT.Pt chose [facility] RN and PT. Romualdo Bolk of [facility] is aware and will collect the pts information from the chart. Will continue to follow.   Anticipated DC Date:  08/28/2013   Anticipated DC Plan:  [place]  CM consult      [facility] Choice  HOME HEALTH   Choice offered to / List presented to:  C-1 Patient        [place] arranged  HH-1 RN  [place].   Status of service:  Completed, signed off Medicare Important Message given?  YES (If response is "NO", the following Medicare IM given date fields will be blank) Date Medicare IM given:  08/28/2013 Date Additional Medicare IM given:    Discharge Disposition:  [place]  Per UR Regulation:    If discussed at Long Length of Stay Meetings, dates discussed:    Comments:  08/28/13 [place] RN/CM pt returning home with daughter. HH to follow. 08/26/13 [place], RN BSN CM

## 2013-08-26 NOTE — Evaluation (Signed)
Clinical/Bedside Swallow Evaluation  Patient Details  Name: Carrie [name] MRN: [id number] Date of Birth: 1940/12/31  Today's Date: 08/26/2013 Time: 3235-5732 SLP Time Calculation (min): 38 min  Past Medical History:  Past Medical History  Diagnosis Date  . Hyperlipidemia   . Anxiety   . Chronic asthma   . Persistent headaches   . PONV (postoperative nausea and vomiting) 1983  . Arteriosclerotic cardiovascular disease (ASCVD)      single vessel CAD involving the mid LAD, s/p BMS to mid LAD 20/2/54, nl LV systolic fxn  . Pulmonary embolism     Rx: lifelong coumadin 03/2012  . Chronic anticoagulation     Warfarin led to variable INRs; now Rx with rivaroxaban  . Carotid stenosis     Carotid US 7/14:  Bilat.  ICA 1-39% => f/u 1 yr.  [name] GERD (gastroesophageal reflux disease)   . Dementia   . CAD (coronary artery disease)   . Cough   . Syncope and collapse   . Memory deficit   . Benign paroxysmal positional vertigo 01/30/2013  . COPD (chronic obstructive pulmonary disease)   . Melanoma in situ of back    Past Surgical History:  Past Surgical History  Procedure Laterality Date  . Appendectomy    . Tubal ligation    . Cystic surgery on breast      bilateral  . Left ankle surgery    . Shoulder surgery  06/2010    bone spurs left  . Coronary stent placement  03/26/2012  . Coronary angioplasty with stent placement    . Breast surgery     HPI:  Carrie [name] is a 73 y.o. female with a history of intrinsic asthma, coronary artery disease, dementia, and chronic anxiety, who presents to the emergency department with a chief complaint of shortness of breath and cough. Her symptoms started 2-3 days ago. For shortness of breath occurs at rest and mostly with activity. She has some shortness of breath when she lays flat. She denies abdominal swelling or swelling in her legs, but she has had some achiness in her left greater than right thigh, the similar location when she had her previous  DVT. She has had a nonproductive cough which is worse at night. She has had chest congestion and wheezing. She has had a subjective fever, but no chills. She denies chest pain other than when she coughs. She denies pleurisy. She denies nausea, vomiting, or diarrhea. She has had mild nasal congestion. She denies outright body aches, but says that her "bones hurt", which is not new. She has been using her nebulizers and inhalers more frequently, but without relief. Her daughter checked her oxygen saturation when she bent over and it was 88% at home. She is not on supplemental oxygen. She has a history of pulmonary embolism, now off of Coumadin but on Effient because she had severe bruising with Coumadin   Assessment / Plan / Recommendation Clinical Impression  Carrie [name] was seen in room daughter present. Daughter reports positive coughing at home during meals and when drinking liquids. Pt reports h/o esophageal dilation "over 10 years ago" and cannot remember the doctor's name. In depth chart review after visit reveals that pt frequently saw Dr. Deatra Ina in [place] and had EGD 06/11/2008 with dilation and finding of hiatal hernia. She also has a history of presumed gastroparesis which was treated with Reglan (on and off) and reflux. It does not appear that the pt has seen Dr. Deatra Ina since 2010.  Pt assessed with thins, puree, and mech soft this date and occasional coughing elicited. Suspect pt's dysphagia may be multifactorial with negative influences of reflux, gastroparesis, and asthma. Recommend MBSS tomorrow (daughter requests we complete in the afternoon so that she can be present) with esophageal sweep. Pt may also benefit from GI consult pending results of MBSS. Will initiate puree diet with thin liquids. No straws. Pt to take small sips. SLP will follow.     Aspiration Risk  Mild    Diet Recommendation Dysphagia 1 (Puree);Thin liquid   Liquid Administration via: Cup Medication Administration:  Crushed with puree Supervision: Patient able to self feed;Intermittent supervision to cue for compensatory strategies Compensations: Slow rate;Small sips/bites Postural Changes and/or Swallow Maneuvers: Seated upright 90 degrees;Upright 30-60 min after meal    Other  Recommendations Recommended Consults: MBS;Consider GI evaluation Oral Care Recommendations: Oral care BID;Patient independent with oral care Other Recommendations: Clarify dietary restrictions   Follow Up Recommendations   (To be determined)    Frequency and Duration min 2x/week  1 week         Swallow Study Prior Functional Status   Lives with daughter    General Date of Onset: 08/23/13 HPI: Carrie [name] is a 73 y.o. female with a history of intrinsic asthma, coronary artery disease, dementia, and chronic anxiety, who presents to the emergency department with a chief complaint of shortness of breath and cough. Her symptoms started 2-3 days ago. For shortness of breath occurs at rest and mostly with activity. She has some shortness of breath when she lays flat. She denies abdominal swelling or swelling in her legs, but she has had some achiness in her left greater than right thigh, the similar location when she had her previous DVT. She has had a nonproductive cough which is worse at night. She has had chest congestion and wheezing. She has had a subjective fever, but no chills. She denies chest pain other than when she coughs. She denies pleurisy. She denies nausea, vomiting, or diarrhea. She has had mild nasal congestion. She denies outright body aches, but says that her "bones hurt", which is not new. She has been using her nebulizers and inhalers more frequently, but without relief. Her daughter checked her oxygen saturation when she bent over and it was 88% at home. She is not on supplemental oxygen. She has a history of pulmonary embolism, now off of Coumadin but on Effient because she had severe bruising with  Coumadin Type of Study: Bedside swallow evaluation Previous Swallow Assessment: EGD 06/11/2008 Diet Prior to this Study: NPO Temperature Spikes Noted: No Respiratory Status: Nasal cannula History of Recent Intubation: No Behavior/Cognition: Alert;Cooperative Oral Cavity - Dentition: Adequate natural dentition Self-Feeding Abilities: Able to feed self Patient Positioning: Upright in chair Baseline Vocal Quality: Clear Volitional Cough: Strong Volitional Swallow: Able to elicit    Oral/Motor/Sensory Function Overall Oral Motor/Sensory Function: Appears within functional limits for tasks assessed   Ice Chips Ice chips: Within functional limits Presentation: Spoon   Thin Liquid Thin Liquid: Impaired Presentation: Cup;Straw;Self Fed Pharyngeal  Phase Impairments: Cough - Delayed;Cough - Immediate Other Comments: no coughing with small sips    Nectar Thick Nectar Thick Liquid: Not tested   Honey Thick Honey Thick Liquid: Not tested   Puree Puree: Within functional limits Presentation: Spoon   Solid       Solid: Impaired Presentation: Self Fed;Spoon Pharyngeal Phase Impairments: Cough - Delayed      Thank you,  Havery Moros, CCC-SLP [phone]  Sharde Gover 08/26/2013,10:46 PM

## 2013-08-27 ENCOUNTER — Inpatient Hospital Stay (HOSPITAL_COMMUNITY): Payer: Medicare Other

## 2013-08-27 LAB — CBC
HCT: 36 % (ref 36.0–46.0)
Hemoglobin: 11.4 g/dL — ABNORMAL LOW (ref 12.0–15.0)
MCH: 28.8 pg (ref 26.0–34.0)
MCHC: 31.7 g/dL (ref 30.0–36.0)
MCV: 90.9 fL (ref 78.0–100.0)
PLATELETS: 270 10*3/uL (ref 150–400)
RBC: 3.96 MIL/uL (ref 3.87–5.11)
RDW: 15.5 % (ref 11.5–15.5)
WBC: 11.7 10*3/uL — AB (ref 4.0–10.5)

## 2013-08-27 LAB — BASIC METABOLIC PANEL
BUN: 18 mg/dL (ref 6–23)
CHLORIDE: 103 meq/L (ref 96–112)
CO2: 31 meq/L (ref 19–32)
Calcium: 9 mg/dL (ref 8.4–10.5)
Creatinine, Ser: 0.64 mg/dL (ref 0.50–1.10)
GFR calc Af Amer: >90 mL/min (ref >90)
GFR calc non Af Amer: 87 mL/min — ABNORMAL LOW (ref >90)
Glucose, Bld: 134 mg/dL — ABNORMAL HIGH (ref 70–99)
POTASSIUM: 4.7 meq/L (ref 3.7–5.3)
Sodium: 142 mEq/L (ref 137–147)

## 2013-08-27 LAB — GLUCOSE, CAPILLARY
GLUCOSE-CAPILLARY: 127 mg/dL — AB (ref 70–99)
Glucose-Capillary: 120 mg/dL — ABNORMAL HIGH (ref 70–99)
Glucose-Capillary: 98 mg/dL (ref 70–99)
Glucose-Capillary: 97 mg/dL (ref 70–99)

## 2013-08-27 MED ORDER — IPRATROPIUM-ALBUTEROL 0.5-2.5 (3) MG/3ML IN SOLN
3.0000 mL | Freq: Three times a day (TID) | RESPIRATORY_TRACT | Status: DC
  Administered 2013-08-27 – 2013-08-28 (×3): 3 mL via RESPIRATORY_TRACT
  Filled 2013-08-27 (×3): qty 3

## 2013-08-27 MED ORDER — METHYLPREDNISOLONE SODIUM SUCC 125 MG IJ SOLR
60.0000 mg | Freq: Two times a day (BID) | INTRAMUSCULAR | Status: DC
  Administered 2013-08-28: 60 mg via INTRAVENOUS
  Filled 2013-08-27: qty 2

## 2013-08-27 NOTE — Evaluation (Signed)
Physical Therapy Evaluation Patient Details Name: Carrie [name] MRN: [id number] DOB: 1940-07-01 Today's Date: 08/27/2013 Time: 7829-5621 PT Time Calculation (min): 39 min  PT Assessment / Plan / Recommendation History of Present Illness  Pt with a hx of asthma and dementia was admitted with acute bronchitis.  She has a hx of falls at home and usually uses a cane or walker for gait.  Her family lives with her.  Clinical Impression   Pt was seen for evaluation.  She was very pleasant and cooperative.  She is generally deconditioned and this impacts her standing balance.  Therefore , she currently needs a walker to maintain a stable gait.  She should be able to transition to home at d/c but would benefit from HHPT at that time.  She has all needed DME.  While she is here, we will work with her on general strengthening exercises and gait.  I will ask nursing service to assist her in gait with a walker in the hallway.  I provided her with a walker.    PT Assessment  Patient needs continued PT services    Follow Up Recommendations  Home health PT               Equipment Recommendations  None recommended by PT       Frequency Min 3X/week    Precautions / Restrictions Precautions Precautions: Fall Restrictions Weight Bearing Restrictions: No         Mobility  Bed Mobility Overal bed mobility: Independent Transfers Overall transfer level: Needs assistance Equipment used: Rolling walker (2 wheeled) Transfers: Sit to/from Stand Sit to Stand: Supervision General transfer comment: pt is used to using a lift chair Ambulation/Gait Ambulation/Gait assistance: Supervision Ambulation Distance (Feet): 100 Feet Assistive device: Rolling walker (2 wheeled) Gait velocity interpretation: Below normal speed for age/gender         PT Diagnosis: Difficulty walking;Generalized weakness  PT Problem List: Decreased strength;Decreased activity tolerance;Decreased mobility PT Treatment  Interventions: Gait training;Functional mobility training;Therapeutic exercise     PT Goals(Current goals can be found in the care plan section) Acute Rehab PT Goals Patient Stated Goal: none stated PT Goal Formulation: With patient Time For Goal Achievement: 09/10/13 Potential to Achieve Goals: Good  Visit Information  Last PT Received On: 08/27/13 History of Present Illness: Pt with a hx of asthma and dementia was admitted with acute bronchitis.  She has a hx of falls at home and usually uses a cane or walker for gait.  Her family lives with her.       Prior [place] expects to be discharged to:: Private residence Living Arrangements: Children Available Help at Discharge: Family;Available 24 hours/day Type of Home: House Home Access: Ramped entrance Home Layout: One level Home Equipment: Walker - 2 wheels;Walker - 4 wheels;Cane - quad;Other (comment) (lift chair) Prior Function Level of Independence: Independent with assistive device(s) Communication Communication: No difficulties    Cognition  Cognition Arousal/Alertness: Awake/alert Behavior During Therapy: WFL for tasks assessed/performed Overall Cognitive Status: Within Functional Limits for tasks assessed    Extremity/Trunk Assessment Lower Extremity Assessment Lower Extremity Assessment: Generalized weakness   Balance Balance Overall balance assessment: Needs assistance Sitting-balance support: Bilateral upper extremity supported;No upper extremity supported Sitting balance-Leahy Scale: Fair  End of Session PT - End of Session Equipment Utilized During Treatment: Gait belt Activity Tolerance: Patient tolerated treatment well Patient left: in chair;with call bell/phone within reach;with chair alarm set Nurse Communication: Mobility status  GP  Demetrios Isaacs L 08/27/2013, 11:15 AM

## 2013-08-27 NOTE — Discharge Summary (Signed)
[facility] PROGRESS NOTE  Carrie [name] KXF:[id number] DOB: [birth year] DOA: 08/23/2013 PCP: Delphina Cahill, MD  Assessment/Plan: 1. Acute bronchitis in the setting of chronic intrinsic asthma. Improving slowly. Continues with diminished BS particularly in bases and persistent cough. We'll continue IV Solu-Medrol but change to every 12 hours. Continue  duonebs, Levaquin day #3, Tessalon Perles, and Tussionex. We'll continue Singulair, Claritin and Dulera. Oxygen saturation level 9-96% on room air. Will ambulate and monitor.   History of pulmonary embolism/DVT. Lower extremity venous Doppler bilaterally revealed no DVT. The patient has no chest pain. Continue antiplatelet therapy.   ASCVD/CAD. Currently stable. Continue statin, Lasix, Effient.   Chronic anxiety. Appears stable at baseline.   Gastroesophageal reflux disease. Stable at baseline. Continue PPI in am and in evening.  Mild dementia. Stable at baseline. Continue Aricept.  Hyperglycemia, steroid induced. CBG range 122-127. Controlled on sliding scale NovoLog and Levemir.   Reported coughing after eating. Her diet was changed to dysphagia 2 empirically. Await results of modified barium swallow test.  Patient reports remote hx of esophageal dilation.   Code Status: full Family Communication: none present Disposition Plan: home when ready hopefully tomorrow   Consultants:  none  Procedures:  Barium swallow  Antibiotics: Levequin 08/23/13>> HPI/Subjective: Sitting on side of bed. Reports breathing better today.   Objective: Filed Vitals:   08/27/13 0627  BP: 131/69  Pulse: 69  Temp: 97.7 F (36.5 C)  Resp: 18    Intake/Output Summary (Last 24 hours) at 08/27/13 1330 Last data filed at 08/27/13 1321  Gross per 24 hour  Intake 3396.33 ml  Output    652 ml  Net 2744.33 ml   Filed Weights   08/23/13 0926  Weight: 82.555 kg (182 lb)    Exam:   General:  Obese NAD  Cardiovascular: S1 and S2. +murmur. No  gallup. No LE edema  Respiratory: normal effort with conversation. Improved air flow but still diminished in bases. No wheeze.   Abdomen: round but soft +BS throughout non-tender to palpation  Musculoskeletal: no clubbing or cyanosis   Data Reviewed: Basic Metabolic Panel:  Recent Labs Lab 08/23/13 1000 08/24/13 0547 08/26/13 0813 08/27/13 0449  NA 143 143 143 142  K 4.0 4.2 4.8 4.7  CL 103 106 104 103  CO2 29 26 30 31   GLUCOSE 105* 169* 137* 134*  BUN 12 10 18 18   CREATININE 0.77 0.58 0.62 0.64  CALCIUM 9.3 9.2 9.0 9.0   Liver Function Tests: No results found for this basename: AST, ALT, ALKPHOS, BILITOT, PROT, ALBUMIN,  in the last 168 hours No results found for this basename: LIPASE, AMYLASE,  in the last 168 hours No results found for this basename: AMMONIA,  in the last 168 hours CBC:  Recent Labs Lab 08/23/13 1000 08/24/13 0547 08/27/13 0449  WBC 8.4 9.6 11.7*  NEUTROABS 5.0  --   --   HGB 12.9 11.5* 11.4*  HCT 40.6 36.4 36.0  MCV 91.0 91.5 90.9  PLT 285 269 270   Cardiac Enzymes: No results found for this basename: CKTOTAL, CKMB, CKMBINDEX, TROPONINI,  in the last 168 hours BNP (last 3 results) No results found for this basename: PROBNP,  in the last 8760 hours CBG:  Recent Labs Lab 08/26/13 1116 08/26/13 1615 08/26/13 2204 08/27/13 0746 08/27/13 1138  GLUCAP 123* 87 124* 127* 120*    No results found for this or any previous visit (from the past 240 hour(s)).   Studies: No results found.  Scheduled  Meds: . acidophilus  1 capsule Oral Daily  . atorvastatin  80 mg Oral QHS  . azelastine  1 spray Each Nare BID  . benzonatate  200 mg Oral TID  . chlorpheniramine-HYDROcodone  5 mL Oral Q12H  . docusate sodium  100 mg Oral BID  . donepezil  10 mg Oral QHS  . enoxaparin (LOVENOX) injection  40 mg Subcutaneous Q24H  . ezetimibe  10 mg Oral QHS  . feeding supplement (ENSURE COMPLETE)  237 mL Oral BID BM  . fluticasone  2 spray Each Nare BID   . furosemide  40 mg Oral Daily  . insulin aspart  0-15 Units Subcutaneous TID WC  . insulin aspart  0-5 Units Subcutaneous QHS  . insulin detemir  7 Units Subcutaneous BID  . ipratropium-albuterol  3 mL Nebulization TID  . levofloxacin (LEVAQUIN) IV  500 mg Intravenous Q24H  . loratadine  10 mg Oral Daily  . LORazepam  0.5 mg Oral BID  . [START ON 08/28/2013] methylPREDNISolone (SOLU-MEDROL) injection  60 mg Intravenous Q12H  . mometasone-formoterol  2 puff Inhalation TID  . montelukast  10 mg Oral QHS  . multivitamin with minerals  1 tablet Oral q morning - 10a  . pantoprazole  40 mg Oral q morning - 10a  . pantoprazole  80 mg Oral QHS  . potassium chloride SA  20 mEq Oral BID  . prasugrel  5 mg Oral q morning - 10a  . traZODone  50 mg Oral QHS   Continuous Infusions: . 0.9 % NaCl with KCl 20 mEq / L 20 mL/hr at 08/26/13 6144    Principal Problem:   Acute bronchitis Active Problems:   Intrinsic asthma, unspecified   Gastroesophageal reflux   Arteriosclerotic cardiovascular disease (ASCVD)   Chronic anxiety   History of pulmonary embolism   Hyperglycemia, drug-induced    Time spent: 30 minutes    [facility] Pager [phone]. If 7PM-7AM, please contact night-coverage at [email or web address], password [facility] 08/27/2013, 1:30 PM  LOS: 4 days

## 2013-08-27 NOTE — Discharge Summary (Signed)
The patient was seen and examined. Her chart, vital signs, and laboratory studies were reviewed. She was discussed with nurse practitioner, Ms. Renard Hamper. Agree with her findings and plan. The patient's wheezing/bronchospasms are  slightly subsiding, but she continues to have a paroxysmal cough. Plan on weaning down Solu-Medrol today. Modified barium swallow results pending for assessment of silent aspiration. Anticipate discharge to home in the next 24-48 hours. She will need to followup with her primary pulmonologist, Dr. Soledad Gerlach at [facility].

## 2013-08-27 NOTE — Procedures (Signed)
Objective Swallowing Evaluation: Modified Barium Swallowing Study   Patient Details  Name: Carrie [name] MRN: [id number] Date of Birth: February 08, 1941  Today's Date: 08/27/2013 Time: 1330-1400 SLP Time Calculation (min): 30 min  Past Medical History:  Past Medical History  Diagnosis Date  . Hyperlipidemia   . Anxiety   . Chronic asthma   . Persistent headaches   . PONV (postoperative nausea and vomiting) 1983  . Arteriosclerotic cardiovascular disease (ASCVD)      single vessel CAD involving the mid LAD, s/p BMS to mid LAD 44/0/34, nl LV systolic fxn  . Pulmonary embolism     Rx: lifelong coumadin 03/2012  . Chronic anticoagulation     Warfarin led to variable INRs; now Rx with rivaroxaban  . Carotid stenosis     Carotid US 7/14:  Bilat.  ICA 1-39% => f/u 1 yr.  [name] GERD (gastroesophageal reflux disease)   . Dementia   . CAD (coronary artery disease)   . Cough   . Syncope and collapse   . Memory deficit   . Benign paroxysmal positional vertigo 01/30/2013  . COPD (chronic obstructive pulmonary disease)   . Melanoma in situ of back    Past Surgical History:  Past Surgical History  Procedure Laterality Date  . Appendectomy    . Tubal ligation    . Cystic surgery on breast      bilateral  . Left ankle surgery    . Shoulder surgery  06/2010    bone spurs left  . Coronary stent placement  03/26/2012  . Coronary angioplasty with stent placement    . Breast surgery     HPI:  Carrie [name] is a 73 y.o. female with a history of intrinsic asthma, coronary artery disease, dementia, and chronic anxiety, who presents to the emergency department with a chief complaint of shortness of breath and cough. Her symptoms started 2-3 days ago. For shortness of breath occurs at rest and mostly with activity. She has some shortness of breath when she lays flat. She denies abdominal swelling or swelling in her legs, but she has had some achiness in her left greater than right thigh, the similar  location when she had her previous DVT. She has had a nonproductive cough which is worse at night. She has had chest congestion and wheezing. She has had a subjective fever, but no chills. She denies chest pain other than when she coughs. She denies pleurisy. She denies nausea, vomiting, or diarrhea. She has had mild nasal congestion. She denies outright body aches, but says that her "bones hurt", which is not new. She has been using her nebulizers and inhalers more frequently, but without relief. Her [name] checked her oxygen saturation when she bent over and it was 88% at home. She is not on supplemental oxygen. She has a history of pulmonary embolism, now off of Coumadin but on Effient because she had severe bruising with Coumadin     Assessment / Plan / Recommendation Clinical Impression  Dysphagia Diagnosis: Mild pharyngeal phase dysphagia;Mild cervical esophageal phase dysphagia Clinical impression: Carrie [name] accompanied her to [facility] and reported that her mother did very well with puree tray and thin liquids with little coughing. Pt reports increased coughing/congestion today. MBSS revealed mild pharyngeal phase dysphagia characterized by premature spillage of liquids into pharynx with delay in swallow initiation, decreased epiglottic deflection resulting in penetration (silent and audible/cough) of thins during the swallow without aspiration. Delayed initiation evident across all consistencies/textures triggering after filling  the valleculae. Pt also noted to have prominent cricopharyngeal bar. Pt grimmaced when swallowing solids and elicited effortful swallow to pass through UES. Pharyngeal strength appears within functional limits as pt with little to no residuals in pharynx. Chin tuck was attempted however penetration persisted. Pt appears to have good sensation, however her coughing seems out of proportion to the penetration. Esophageal sweep was essentially unremarkable except for  prominent cricopharyngeus. Coughing episodes may be due to a combination of reflux, lung function, and penetration.   Pt may wish to follow up with her GI doctor, Dr. Deatra Ina. She had an EGD 06/11/2008 with dilation and finding of hiatal hernia. She also has a history of presumed gastroparesis which was treated with Reglan (on and off) and reflux. It does not appear that the pt has seen Dr. Deatra Ina since 2010. Recommend D3/mech soft and thin liquids. Pt needs to take small sips. She did well with nectar-thick liquids and if she appears to tolerate them better clinically (ie. less coughing), we can thicken to nectar. MBSS reviewed with pt and [name].    Treatment Recommendation  No treatment recommended at this time    Diet Recommendation Dysphagia 3 (Mechanical Soft);Thin liquid   Liquid Administration via: Cup Medication Administration: Crushed with puree Supervision: Patient able to self feed;Intermittent supervision to cue for compensatory strategies Compensations: Slow rate;Small sips/bites Postural Changes and/or Swallow Maneuvers: Seated upright 90 degrees;Upright 30-60 min after meal    Other  Recommendations Oral Care Recommendations: Oral care BID;Patient independent with oral care Other Recommendations: Clarify dietary restrictions   Follow Up Recommendations  None    Frequency and Duration  N/A           General Date of Onset: 08/23/13 HPI: Carrie [name] is a 73 y.o. female with a history of intrinsic asthma, coronary artery disease, dementia, and chronic anxiety, who presents to the emergency department with a chief complaint of shortness of breath and cough. Her symptoms started 2-3 days ago. For shortness of breath occurs at rest and mostly with activity. She has some shortness of breath when she lays flat. She denies abdominal swelling or swelling in her legs, but she has had some achiness in her left greater than right thigh, the similar location when she had her previous  DVT. She has had a nonproductive cough which is worse at night. She has had chest congestion and wheezing. She has had a subjective fever, but no chills. She denies chest pain other than when she coughs. She denies pleurisy. She denies nausea, vomiting, or diarrhea. She has had mild nasal congestion. She denies outright body aches, but says that her "bones hurt", which is not new. She has been using her nebulizers and inhalers more frequently, but without relief. Her [name] checked her oxygen saturation when she bent over and it was 88% at home. She is not on supplemental oxygen. She has a history of pulmonary embolism, now off of Coumadin but on Effient because she had severe bruising with Coumadin Type of Study: Modified Barium Swallowing Study Reason for Referral: Objectively evaluate swallowing function Previous Swallow Assessment: EGD 06/11/2008 Diet Prior to this Study: Dysphagia 1 (puree);Thin liquids Temperature Spikes Noted: No Respiratory Status: Nasal cannula History of Recent Intubation: No Behavior/Cognition: Alert;Cooperative Oral Cavity - Dentition: Adequate natural dentition Oral Motor / Sensory Function: Within functional limits Self-Feeding Abilities: Able to feed self Patient Positioning: Upright in chair Baseline Vocal Quality: Clear Volitional Cough: Strong Volitional Swallow: Able to elicit Anatomy: Within functional limits  Pharyngeal Secretions: Not observed secondary MBS    Reason for Referral Objectively evaluate swallowing function   Oral Phase Oral Preparation/Oral Phase Oral Phase: WFL   Pharyngeal Phase Pharyngeal Phase Pharyngeal Phase: Impaired Pharyngeal - Nectar Pharyngeal - Nectar Cup: Premature spillage to valleculae;Premature spillage to pyriform sinuses;Pharyngeal residue - cp segment Pharyngeal - Thin Pharyngeal - Thin Teaspoon: Delayed swallow initiation;Premature spillage to valleculae;Premature spillage to pyriform sinuses;Penetration/Aspiration  during swallow Penetration/Aspiration details (thin teaspoon): Material enters airway, remains ABOVE vocal cords then ejected out Pharyngeal - Thin Cup: Delayed swallow initiation;Premature spillage to valleculae;Premature spillage to pyriform sinuses;Reduced epiglottic inversion;Penetration/Aspiration during swallow Penetration/Aspiration details (thin cup): Material enters airway, remains ABOVE vocal cords then ejected out Pharyngeal - Thin Straw: Delayed swallow initiation;Premature spillage to valleculae;Premature spillage to pyriform sinuses;Reduced epiglottic inversion;Penetration/Aspiration during swallow Penetration/Aspiration details (thin straw): Material enters airway, remains ABOVE vocal cords then ejected out Pharyngeal - Solids Pharyngeal - Puree: Delayed swallow initiation;Premature spillage to valleculae Pharyngeal - Regular: Delayed swallow initiation;Premature spillage to valleculae Pharyngeal - Pill: Within functional limits Pharyngeal Phase - Comment Pharyngeal Comment: consistent penetration of thin liquids during the swallow, no residuals  Cervical Esophageal Phase    GO    Cervical Esophageal Phase Cervical Esophageal Phase: Impaired Cervical Esophageal Phase - Solids Regular: Prominent cricopharyngeal segment Cervical Esophageal Phase - Comment Cervical Esophageal Comment: Appearance of cricopharyngeal bar necessitating effortful swallow        Thank you,  Genene Churn, [place]  PORTER,DABNEY 08/27/2013, 4:27 PM

## 2013-08-28 ENCOUNTER — Encounter: Payer: Self-pay | Admitting: Gastroenterology

## 2013-08-28 DIAGNOSIS — R1314 Dysphagia, pharyngoesophageal phase: Secondary | ICD-10-CM | POA: Diagnosis present

## 2013-08-28 LAB — GLUCOSE, CAPILLARY
GLUCOSE-CAPILLARY: 116 mg/dL — AB (ref 70–99)
Glucose-Capillary: 103 mg/dL — ABNORMAL HIGH (ref 70–99)

## 2013-08-28 MED ORDER — DOCUSATE SODIUM 100 MG PO CAPS: 100.0000 mg | ORAL_CAPSULE | Freq: Two times a day (BID) | ORAL | Status: DC

## 2013-08-28 MED ORDER — LORAZEPAM 0.5 MG PO TABS: 0.5000 mg | ORAL_TABLET | Freq: Two times a day (BID) | ORAL | Status: DC

## 2013-08-28 MED ORDER — HYDROCOD POLST-CHLORPHEN POLST 10-8 MG/5ML PO LQCR: 5.0000 mL | Freq: Two times a day (BID) | ORAL | Status: DC

## 2013-08-28 MED ORDER — PREDNISONE 10 MG PO TABS: ORAL_TABLET | ORAL | Status: DC

## 2013-08-28 MED ORDER — LEVOFLOXACIN 500 MG PO TABS: 500.0000 mg | ORAL_TABLET | Freq: Every day | ORAL | Status: DC

## 2013-08-28 MED ORDER — DSS 100 MG PO CAPS: 100.0000 mg | ORAL_CAPSULE | Freq: Two times a day (BID) | ORAL | Status: DC

## 2013-08-28 NOTE — Discharge Summary (Signed)
Patient seen and examined this morning. Shortness of breath much improved with minimal wheezing today. Agree with plan on discharging her home with a short oral prednisone taper, continue home nebulizers as needed, daily and Combivent. Will  Complete a short course of Levaquin as outpatient. Patient has followup with her PCP and pulmonology as outpatient pediatrician followup with GI as outpatient as well.

## 2013-08-28 NOTE — Discharge Summary (Signed)
Physician Discharge Summary  Carrie [name] [id number] DOB: 04/29/1941 DOA: 08/23/2013  PCP: Delphina Cahill, MD  Admit date: 08/23/2013 Discharge date: 08/28/2013  Time spent: 40 minutes  Recommendations for Outpatient Follow-up:  1. PCP 1 week for evaluation of respiratory status. 2. Tye Savoy NP with Dr. Deatra Ina for evaluation of dysphasia. On Dysphagia 3 thin liquid diet 3. Dr. Laurance Flatten pulmonology at [facility]. Daughter to schedule appointment 1-2 weeks.  4. Home Health PT for strength and endurance gait   Discharge Diagnoses:  Principal Problem:   Acute bronchitis Active Problems:   Intrinsic asthma, unspecified   Gastroesophageal reflux   Arteriosclerotic cardiovascular disease (ASCVD)   Chronic anxiety   History of pulmonary embolism   Hyperglycemia, drug-induced   Discharge Condition: stable  Diet recommendation: dysphagia 3 thin liquid heart healthy  Filed Weights   08/23/13 0926  Weight: 82.555 kg (182 lb)    History of present illness:  Carrie [name] is a 73 y.o. female with a history of intrinsic asthma, coronary artery disease, dementia, and chronic anxiety, who presented to the emergency department on 08/23/13 with a chief complaint of shortness of breath and cough. Her symptoms started 2-3 days prior. Her shortness of breath occured at rest and mostly with activity. She had some shortness of breath when she lay flat. She denied abdominal swelling or swelling in her legs, but she  had some achiness in her left greater than right thigh, the similar location when she had her previous DVT. She had a nonproductive cough which was worse at night. She  had chest congestion and wheezing. She had a subjective fever, but no chills. She denied chest pain other than when she coughs. She denied pleurisy. She denied nausea, vomiting, or diarrhea. She had mild nasal congestion. She denied outright body aches, but said that her "bones hurt", which was not new. She had been using her  nebulizers and inhalers more frequently, but without relief. Her daughter checked her oxygen saturation when she bent over and it was 88% at home. She was not on supplemental oxygen. She had a history of pulmonary embolism, now off of Coumadin but on Effient because she had severe bruising with Coumadin.   In the emergency department, she was less short of breath. Her oxygen saturation was 93% on 2 L of nasal cannula oxygen. She was afebrile and hemodynamically stable. Her chest x-ray revealed mild left base atelectasis, but no edema or consolidation. Her lab data were virtually within normal limits. She was admitted for further evaluation and management.      Hospital Course:  1. Acute bronchitis in the setting of chronic intrinsic asthma. Provided with  IV Solu-Medrol, duonebs, Levaquin day, Tessalon Perles, and Tussionex. Singulair, Claritin and Dulera all continued. Improved over 2 days. At discharge  Oxygen saturation level 94-96% on room air.  Will follow up withPCP 1 week for evaluation of respiratory status. Daughter to contact Dr. Laurance Flatten pulmonologist at [facility] for appointment 1-2 weeks.   History of pulmonary embolism/DVT. Lower extremity venous Doppler bilaterally revealed no DVT. The patient had no chest pain. Continue antiplatelet therapy.   ASCVD/CAD. Remained stable during hospitalization. Continue statin, Lasix, Effient.   Chronic anxiety. remained stable at baseline.   Gastroesophageal reflux disease. Stable at baseline. Continue Protonix in am and nexium in evening.   Mild dementia. Stable at baseline. Continue Aricept.   Hyperglycemia, steroid induced. CBG range 122-127. Controlled on sliding scale NovoLog and Levemir. Tapering steroids at discharge. Anticipate resolution once off  steroids  Dysphagia:  Reported coughing after eating. Her diet was changed to dysphagia 2 empirically. Barium swallow test reveals mild pharyngeal phase dysphagia. Patient reports remote hx of  esophageal dilation. Will follow up with Dr. Kelby Fam NP Nevin Bloodgood on 09/04/13. Recommend Dysphagia 3 thin liquid diet     Procedures: Modified Barium swallow 08/27/13 Consultations:  none  Discharge Exam: Filed Vitals:   08/28/13 0300  BP: 124/76  Pulse: 63  Temp: 97 F (36.1 C)  Resp: 20    General: obese appears comfortable Cardiovascular: S1 and S2. +murmur no gallup no rub No LE edema Respiratory: normal effort BS somewhat coarse with rhonchi. Improved air flow.   Discharge Instructions       Future Appointments Provider Department Dept Phone   09/04/2013 2:00 PM Willia Craze, NP [facility] [phone]       Medication List    STOP taking these medications       clonazePAM 1 MG tablet  Commonly known as:  KLONOPIN      TAKE these medications       atorvastatin 80 MG tablet  Commonly known as:  LIPITOR  Take 1 tablet (80 mg total) by mouth at bedtime.     azelastine 137 MCG/SPRAY nasal spray  Commonly known as:  ASTELIN  Place 1 spray into both nostrils 2 (two) times daily. Use in each nostril as directed     benzonatate 100 MG capsule  Commonly known as:  TESSALON  Take 200 mg by mouth 3 (three) times daily.     bisacodyl 5 MG EC tablet  Commonly known as:  DULCOLAX  Take 5 mg by mouth daily as needed for constipation.     chlorpheniramine-HYDROcodone 10-8 MG/5ML Lqcr  Commonly known as:  TUSSIONEX  Take 5 mLs by mouth every 12 (twelve) hours.     donepezil 10 MG tablet  Commonly known as:  ARICEPT  Take 10 mg by mouth at bedtime.     DSS 100 MG Caps  Take 100 mg by mouth 2 (two) times daily.     DULERA 200-5 MCG/ACT Aero  Generic drug:  mometasone-formoterol  Inhale 2 puffs into the lungs 3 (three) times daily.     ezetimibe 10 MG tablet  Commonly known as:  ZETIA  Take 10 mg by mouth at bedtime.     fexofenadine 180 MG tablet  Commonly known as:  ALLEGRA  Take 180 mg by mouth daily.     fluticasone 50  MCG/ACT nasal spray  Commonly known as:  FLONASE  Place 2 sprays into the nose 2 (two) times daily.     furosemide 40 MG tablet  Commonly known as:  LASIX  Take 40 mg by mouth daily.     HYDROcodone-homatropine 5-1.5 MG/5ML syrup  Commonly known as:  HYCODAN  Take 5 mLs by mouth 3 times/day as needed-between meals & bedtime for cough.     ipratropium-albuterol 0.5-2.5 (3) MG/3ML Soln  Commonly known as:  DUONEB  Take 3 mLs by nebulization every 4 (four) hours as needed. For shortness of breath     COMBIVENT RESPIMAT 20-100 MCG/ACT Aers respimat  Generic drug:  Ipratropium-Albuterol  Inhale 1 puff into the lungs every 6 (six) hours.     levofloxacin 500 MG tablet  Commonly known as:  LEVAQUIN  Take 1 tablet (500 mg total) by mouth daily.     LORazepam 0.5 MG tablet  Commonly known as:  ATIVAN  Take 1 tablet (0.5 mg  total) by mouth 2 (two) times daily.     montelukast 10 MG tablet  Commonly known as:  SINGULAIR  Take 10 mg by mouth at bedtime.     multivitamin with minerals Tabs tablet  Take 1 tablet by mouth every morning.     NEXIUM PO  Take 1 capsule by mouth at bedtime.     pantoprazole 40 MG tablet  Commonly known as:  PROTONIX  Take 1 tablet by mouth daily.     potassium chloride SA 20 MEQ tablet  Commonly known as:  K-DUR,KLOR-CON  Take 20 mEq by mouth 2 (two) times daily.     prasugrel 5 MG Tabs tablet  Commonly known as:  EFFIENT  Take 1 tablet (5 mg total) by mouth every morning.     predniSONE 10 MG tablet  Commonly known as:  DELTASONE  Take 4 tabs for 3 days starting 08/29/13 then take 2 tabs for 3 days then take 1 tab for 3 days then stop.     PROAIR HFA 108 (90 BASE) MCG/ACT inhaler  Generic drug:  albuterol  Inhale 2 puffs into the lungs every 6 (six) hours as needed. For rescue/shortness of breath     traMADol 50 MG tablet  Commonly known as:  ULTRAM  Take 2 tablets by mouth every 6 (six) hours as needed for moderate pain.     traZODone 50  MG tablet  Commonly known as:  DESYREL  Take 50 mg by mouth at bedtime.     TRUBIOTICS Caps  Take 1 capsule by mouth daily.       Allergies  Allergen Reactions  . Aspirin Hives  . Metoclopramide Hcl     REACTION: intolerant, restless hands  . Morphine Hives  . Adhesive [Tape] Itching and Rash    ONLY TO USE PAPER TAPE  . Codeine Itching and Rash  . Penicillins Itching and Rash  . Sulfonamide Derivatives Itching and Rash   Follow-up Information   Follow up with [facility].   Contact information:   [address] [place] [address] [phone]       Follow up with Delphina Cahill, MD. (has appointment next week)    Specialty:  Internal Medicine   Contact information:    [place] [address] [phone]       Follow up with Tye Savoy, NP On 09/04/2013. (appointment at 2pm. )    Specialty:  Nurse Practitioner   Contact information:   [place] [place] [address] [phone]        The results of significant diagnostics from this hospitalization (including imaging, microbiology, ancillary and laboratory) are listed below for reference.    Significant Diagnostic Studies: Dg Chest 2 View  08/23/2013   CLINICAL DATA:  Shortness of breath and cough  EXAM: CHEST  2 VIEW  COMPARISON:  December 05, 2012  FINDINGS: There is minimal atelectasis in the left base. Lungs are otherwise clear. Heart size and pulmonary vascularity are normal. No adenopathy. No bone lesions.  IMPRESSION: No edema or consolidation.  Minimal left base atelectasis.   Electronically Signed   By: Lowella Grip M.D.   On: 08/23/2013 09:47   US Venous Img Lower Bilateral  08/23/2013   CLINICAL DATA:  73 year old female with lower extremity pain. History of pulmonary embolus. Initial encounter.  EXAM: BILATERAL LOWER EXTREMITY VENOUS DOPPLER ULTRASOUND  TECHNIQUE: Gray-scale sonography with graded compression, as well as color Doppler and duplex  ultrasound, were performed to evaluate the deep venous system from the level of the common femoral vein through the popliteal and proximal calf veins. Spectral Doppler was utilized to evaluate flow at rest and with distal augmentation maneuvers.  COMPARISON:  Left lower extremity study 12/05/2012 and earlier.  FINDINGS: Thrombus within deep veins:  None visualized.  Compressibility of deep veins:  Normal.  Duplex waveform respiratory phasicity:  Normal.  Duplex waveform response to augmentation:  Normal.  Venous reflux:  None visualized.  Other findings: Both greater saphenous veins appear patent. The left demonstrates tortuous varicosities (image 77).  IMPRESSION: No evidence of  1.  No evidence of bilateral lower extremity deep venous thrombosis. 2. Left lower extremity venous varicosities. lower extremity deep venous thrombosis.   Electronically Signed   By: Lars Pinks M.D.   On: 08/23/2013 14:48   Dg Swallowing Func-speech Pathology  08/27/2013   Ephraim Hamburger, CCC-SLP     08/27/2013  4:28 PM Objective Swallowing Evaluation: Modified Barium Swallowing Study    Patient Details  Name: BRANDA CHAUDHARY MRN: [id number] Date of Birth: 1940-09-05  Today's Date: 08/27/2013 Time: 1330-1400 SLP Time Calculation (min): 30 min  Past Medical History:  Past Medical History  Diagnosis Date  . Hyperlipidemia   . Anxiety   . Chronic asthma   . Persistent headaches   . PONV (postoperative nausea and vomiting) 1983  . Arteriosclerotic cardiovascular disease (ASCVD)      single vessel CAD involving the mid LAD, s/p BMS to mid LAD  47/4/25, nl LV systolic fxn  . Pulmonary embolism     Rx: lifelong coumadin 03/2012  . Chronic anticoagulation     Warfarin led to variable INRs; now Rx with rivaroxaban  . Carotid stenosis     Carotid US 7/14:  Bilat.  ICA 1-39% => f/u 1 yr.  [name] GERD (gastroesophageal reflux disease)   . Dementia   . CAD (coronary artery disease)   . Cough   . Syncope and collapse   . Memory deficit   . Benign paroxysmal  positional vertigo 01/30/2013  . COPD (chronic obstructive pulmonary disease)   . Melanoma in situ of back    Past Surgical History:  Past Surgical History  Procedure Laterality Date  . Appendectomy    . Tubal ligation    . Cystic surgery on breast      bilateral  . Left ankle surgery    . Shoulder surgery  06/2010    bone spurs left  . Coronary stent placement  03/26/2012  . Coronary angioplasty with stent placement    . Breast surgery     HPI:  CHANTA BAUERS is a 73 y.o. female with a history of intrinsic  asthma, coronary artery disease, dementia, and chronic anxiety,  who presents to the emergency department with a chief complaint  of shortness of breath and cough. Her symptoms started 2-3 days  ago. For shortness of breath occurs at rest and mostly with  activity. She has some shortness of breath when she lays flat.  She denies abdominal swelling or swelling in her legs, but she  has had some achiness in her left greater than right thigh, the  similar location when she had her previous DVT. She has had a  nonproductive cough which is worse at night. She has had chest  congestion and wheezing. She has had a subjective fever, but no  chills. She denies chest pain other than when she coughs. She  denies  pleurisy. She denies nausea, vomiting, or diarrhea. She  has had mild nasal congestion. She denies outright body aches,  but says that her "bones hurt", which is not new. She has been  using her nebulizers and inhalers more frequently, but without  relief. Her daughter checked her oxygen saturation when she bent  over and it was 88% at home. She is not on supplemental oxygen.  She has a history of pulmonary embolism, now off of Coumadin but  on Effient because she had severe bruising with Coumadin     Assessment / Plan / Recommendation Clinical Impression  Dysphagia Diagnosis: Mild pharyngeal phase dysphagia;Mild  cervical esophageal phase dysphagia Clinical impression: Mrs. Dobrowski's daughter accompanied her to [facility]   and reported that her mother did very well with puree tray and  thin liquids with little coughing. Pt reports increased  coughing/congestion today. MBSS revealed mild pharyngeal phase  dysphagia characterized by premature spillage of liquids into  pharynx with delay in swallow initiation, decreased epiglottic  deflection resulting in penetration (silent and audible/cough) of  thins during the swallow without aspiration. Delayed initiation  evident across all consistencies/textures triggering after  filling the valleculae. Pt also noted to have prominent  cricopharyngeal bar. Pt grimmaced when swallowing solids and  elicited effortful swallow to pass through UES. Pharyngeal  strength appears within functional limits as pt with little to no  residuals in pharynx. Chin tuck was attempted however penetration  persisted. Pt appears to have good sensation, however her  coughing seems out of proportion to the penetration. Esophageal  sweep was essentially unremarkable except for prominent  cricopharyngeus. Coughing episodes may be due to a combination of  reflux, lung function, and penetration.   Pt may wish to follow up with her GI doctor, Dr. Arlyce Dice. She had  an EGD 06/11/2008 with dilation and finding of hiatal hernia. She  also has a history of presumed gastroparesis which was treated  with Reglan (on and off) and reflux. It does not appear that the  pt has seen Dr. Arlyce Dice since 2010. Recommend D3/mech soft and  thin liquids. Pt needs to take small sips. She did well with  nectar-thick liquids and if she appears to tolerate them better  clinically (ie. less coughing), we can thicken to nectar. MBSS  reviewed with pt and daughter.    Treatment Recommendation  No treatment recommended at this time    Diet Recommendation Dysphagia 3 (Mechanical Soft);Thin liquid   Liquid Administration via: Cup Medication Administration: Crushed with puree Supervision: Patient able to self feed;Intermittent supervision  to cue for  compensatory strategies Compensations: Slow rate;Small sips/bites Postural Changes and/or Swallow Maneuvers: Seated upright 90  degrees;Upright 30-60 min after meal    Other  Recommendations Oral Care Recommendations: Oral care  BID;Patient independent with oral care Other Recommendations: Clarify dietary restrictions   Follow Up Recommendations  None    Frequency and Duration  N/A           General Date of Onset: 08/23/13 HPI: BRITANI DURBEN is a 73 y.o. female with a history of  intrinsic asthma, coronary artery disease, dementia, and chronic  anxiety, who presents to the emergency department with a chief  complaint of shortness of breath and cough. Her symptoms started  2-3 days ago. For shortness of breath occurs at rest and mostly  with activity. She has some shortness of breath when she lays  flat. She denies abdominal swelling or swelling in her legs, but  she has had  some achiness in her left greater than right thigh,  the similar location when she had her previous DVT. She has had a  nonproductive cough which is worse at night. She has had chest  congestion and wheezing. She has had a subjective fever, but no  chills. She denies chest pain other than when she coughs. She  denies pleurisy. She denies nausea, vomiting, or diarrhea. She  has had mild nasal congestion. She denies outright body aches,  but says that her "bones hurt", which is not new. She has been  using her nebulizers and inhalers more frequently, but without  relief. Her daughter checked her oxygen saturation when she bent  over and it was 88% at home. She is not on supplemental oxygen.  She has a history of pulmonary embolism, now off of Coumadin but  on Effient because she had severe bruising with Coumadin Type of Study: Modified Barium Swallowing Study Reason for Referral: Objectively evaluate swallowing function Previous Swallow Assessment: EGD 06/11/2008 Diet Prior to this Study: Dysphagia 1 (puree);Thin liquids Temperature Spikes Noted:  No Respiratory Status: Nasal cannula History of Recent Intubation: No Behavior/Cognition: Alert;Cooperative Oral Cavity - Dentition: Adequate natural dentition Oral Motor / Sensory Function: Within functional limits Self-Feeding Abilities: Able to feed self Patient Positioning: Upright in chair Baseline Vocal Quality: Clear Volitional Cough: Strong Volitional Swallow: Able to elicit Anatomy: Within functional limits Pharyngeal Secretions: Not observed secondary MBS    Reason for Referral Objectively evaluate swallowing function   Oral Phase Oral Preparation/Oral Phase Oral Phase: WFL   Pharyngeal Phase Pharyngeal Phase Pharyngeal Phase: Impaired Pharyngeal - Nectar Pharyngeal - Nectar Cup: Premature spillage to  valleculae;Premature spillage to pyriform sinuses;Pharyngeal  residue - cp segment Pharyngeal - Thin Pharyngeal - Thin Teaspoon: Delayed swallow initiation;Premature  spillage to valleculae;Premature spillage to pyriform  sinuses;Penetration/Aspiration during swallow Penetration/Aspiration details (thin teaspoon): Material enters  airway, remains ABOVE vocal cords then ejected out Pharyngeal - Thin Cup: Delayed swallow initiation;Premature  spillage to valleculae;Premature spillage to pyriform  sinuses;Reduced epiglottic inversion;Penetration/Aspiration  during swallow Penetration/Aspiration details (thin cup): Material enters  airway, remains ABOVE vocal cords then ejected out Pharyngeal - Thin Straw: Delayed swallow initiation;Premature  spillage to valleculae;Premature spillage to pyriform  sinuses;Reduced epiglottic inversion;Penetration/Aspiration  during swallow Penetration/Aspiration details (thin straw): Material enters  airway, remains ABOVE vocal cords then ejected out Pharyngeal - Solids Pharyngeal - Puree: Delayed swallow initiation;Premature spillage  to valleculae Pharyngeal - Regular: Delayed swallow initiation;Premature  spillage to valleculae Pharyngeal - Pill: Within functional limits  Pharyngeal Phase - Comment Pharyngeal Comment: consistent penetration of thin liquids during  the swallow, no residuals  Cervical Esophageal Phase    GO    Cervical Esophageal Phase Cervical Esophageal Phase: Impaired Cervical Esophageal Phase - Solids Regular: Prominent cricopharyngeal segment Cervical Esophageal Phase - Comment Cervical Esophageal Comment: Appearance of cricopharyngeal bar  necessitating effortful swallow        Thank you,  Genene Churn, [place]  PORTER,DABNEY 08/27/2013, 4:27 PM     Microbiology: No results found for this or any previous visit (from the past 240 hour(s)).   Labs: Basic Metabolic Panel:  Recent Labs Lab 08/23/13 1000 08/24/13 0547 08/26/13 0813 08/27/13 0449  NA 143 143 143 142  K 4.0 4.2 4.8 4.7  CL 103 106 104 103  CO2 29 26 30 31   GLUCOSE 105* 169* 137* 134*  BUN 12 10 18 18   CREATININE 0.77 0.58 0.62 0.64  CALCIUM 9.3 9.2 9.0 9.0   Liver Function  Tests: No results found for this basename: AST, ALT, ALKPHOS, BILITOT, PROT, ALBUMIN,  in the last 168 hours No results found for this basename: LIPASE, AMYLASE,  in the last 168 hours No results found for this basename: AMMONIA,  in the last 168 hours CBC:  Recent Labs Lab 08/23/13 1000 08/24/13 0547 08/27/13 0449  WBC 8.4 9.6 11.7*  NEUTROABS 5.0  --   --   HGB 12.9 11.5* 11.4*  HCT 40.6 36.4 36.0  MCV 91.0 91.5 90.9  PLT 285 269 270   Cardiac Enzymes: No results found for this basename: CKTOTAL, CKMB, CKMBINDEX, TROPONINI,  in the last 168 hours BNP: BNP (last 3 results) No results found for this basename: PROBNP,  in the last 8760 hours CBG:  Recent Labs Lab 08/27/13 0746 08/27/13 1138 08/27/13 1747 08/27/13 2211 08/28/13 0730  GLUCAP 127* 120* 98 97 116*       Signed:  BLACK,KAREN M  [facility] 08/28/2013, 10:32 AM

## 2013-08-28 NOTE — Plan of Care (Signed)
Problem: Food- and Nutrition-Related Knowledge Deficit (NB-1.1) Goal: Nutrition education Formal process to instruct or train a patient/client in a skill or to impart knowledge to help patients/clients voluntarily manage or modify food choices and eating behavior to maintain or improve health. Outcome: Completed/Met Date Met:  08/28/13 Pt was identified for a Dysphagia 1 diet education. Pt's daughter requested for additional information on a dysphagia 1 diet. Two handouts " Dysphagia 1: Pureed diet" and " Dysphagia 1 : tips" from the Academy of [employer]etics were given to the pt's daughter. RD contact information was given to the pt and daughter if any questions arise or additional information materials are needed.  Pt and daughter were educated on the appropriate consistency for a pureed diet. Daughter and pt were educated on foods that were not recommended. Daughter was educated on tips on how to pureed meat and vegetables. Pt and daughter were told that pt was recently changed to a dysphagia 3 diet (per SLP recommendations) and what that diet consisted of. Pt was willing to try the dysphagia 3 diet while hospitalized to see how it will go. Pt reports she has a big concern for choking and aspirating. Pt and daughter's questions were appropriately answered.   Teach back method was used. Except good compliance.  Kallie Locks Dietetic Intern Pager: [phone]    I agree with student's assessment.  Shianne Zeiser A. Jimmye Norman, RD, LDN Pager: [phone]

## 2013-08-28 NOTE — Progress Notes (Signed)
Nutrition Brief Note  RD pulled to chart for LOS  Wt Readings from Last 15 Encounters:  08/23/13 182 lb (82.555 kg)  07/19/13 185 lb (83.915 kg)  01/30/13 187 lb (84.823 kg)  01/15/13 181 lb 6.4 oz (82.283 kg)  01/14/13 185 lb (83.915 kg)  12/06/12 191 lb 12.8 oz (87 kg)  12/06/12 191 lb 12.8 oz (87 kg)  12/06/12 191 lb 12.8 oz (87 kg)  12/06/12 191 lb 12.8 oz (87 kg)  07/09/12 186 lb 1.9 oz (84.423 kg)  05/23/12 181 lb (82.101 kg)  05/09/12 181 lb (82.101 kg)  04/17/12 183 lb 10.3 oz (83.3 kg)  04/10/12 181 lb (82.101 kg)  03/27/12 188 lb 11.4 oz (85.6 kg)    Body mass index is 29.39 kg/(m^2). Patient meets criteria for overweight based on current BMI.   Current diet order is dysphagia 1, patient is consuming approximately 50-100% of meals at this time. Labs and medications reviewed.   No nutrition interventions warranted at this time. If nutrition issues arise, please consult RD.   Carrie [name] A. Carrie [name], RD, LDN Pager: [phone]

## 2013-08-28 NOTE — Plan of Care (Signed)
Problem: Discharge Progression Outcomes Goal: Other Discharge Outcomes/Goals Outcome: Completed/Met Date Met:  08/28/13 dizschargte instructions read to pt and her daughter Jenny Reichmann They both verbalized understanding of all instructiions

## 2013-08-29 ENCOUNTER — Encounter: Payer: Self-pay | Admitting: *Deleted

## 2013-09-04 ENCOUNTER — Ambulatory Visit (INDEPENDENT_AMBULATORY_CARE_PROVIDER_SITE_OTHER): Payer: Medicare Other | Admitting: Nurse Practitioner

## 2013-09-04 ENCOUNTER — Encounter: Payer: Self-pay | Admitting: Nurse Practitioner

## 2013-09-04 VITALS — BP 120/70 | HR 84 | Ht 65.0 in | Wt 180.4 lb

## 2013-09-04 DIAGNOSIS — K5900 Constipation, unspecified: Secondary | ICD-10-CM

## 2013-09-04 DIAGNOSIS — R1310 Dysphagia, unspecified: Secondary | ICD-10-CM | POA: Insufficient documentation

## 2013-09-04 DIAGNOSIS — K644 Residual hemorrhoidal skin tags: Secondary | ICD-10-CM

## 2013-09-04 DIAGNOSIS — Z7901 Long term (current) use of anticoagulants: Secondary | ICD-10-CM

## 2013-09-04 MED ORDER — HYDROCORTISONE 2.5 % RE CREA: 1.0000 "application " | TOPICAL_CREAM | Freq: Every day | RECTAL | Status: DC

## 2013-09-04 NOTE — Patient Instructions (Signed)
We have sent the following medications to your pharmacy for you to pick up at your convenience: Anusol Cream-Apply to rectum every night x 7 nights.  Please purchase Metamucil over the counter. Take as directed (every day).  Continue food recommendations and swallowing maneuvers recommended to you by speech therapy.

## 2013-09-04 NOTE — Progress Notes (Signed)
HPI :   Patient is a 73 year old female with multiple significant medical problems, on multiple medications including Effient. She is known th Dr. Deatra Ina though hasn't seen him in several years. Patient was hospitalized earlier this month at [facility] with acute bronchitis in setting of chronic asthma. I reviewed those records. Patient complained of coughing with meals. A MBSS revealed mild pharyngeal phase dysphagia, no aspiration. Pt also noted to have prominent cricopharyngeal bar. She was given mechanical soft diet and taught swallowing maneuvers such as chin tucks.   Daughter here, helps with history.Patient complains of food sticking in esophagus and choking / strangling with liquids. Swallowing problems present for at least 4 months now. Patient had EGD with empirical dilation in 2010. She is on chronic PPI therapy.  In addition to dysphagia patient has several GI issues included fecal incontinence with sneezing, bloating, constipation, and hemorrhoids. Constipation refractory to Miralax, stool softeners and dulcolax.    Past Medical History  Diagnosis Date  . Hyperlipidemia   . Anxiety   . Chronic asthma   . Persistent headaches   . PONV (postoperative nausea and vomiting) 1983  . Arteriosclerotic cardiovascular disease (ASCVD)      single vessel CAD involving the mid LAD, s/p BMS to mid LAD 17/5/10, nl LV systolic fxn  . Pulmonary embolism     Rx: lifelong coumadin 03/2012  . Chronic anticoagulation     Warfarin led to variable INRs; now Rx with rivaroxaban  . Carotid stenosis     Carotid US 7/14:  Bilat.  ICA 1-39% => f/u 1 yr.  [name] GERD (gastroesophageal reflux disease)   . Dementia   . CAD (coronary artery disease)   . Syncope and collapse   . Memory deficit   . Benign paroxysmal positional vertigo 01/30/2013  . COPD (chronic obstructive pulmonary disease)   . Melanoma in situ of back   . Hiatal hernia 06/11/2008  . Diverticulosis of colon (without mention of  hemorrhage) 09/09/2008  . Arthritis   . Asthma   . Depression   . Status post dilation of esophageal narrowing   . Pneumonia     Family History  Problem Relation Age of Onset  . Heart attack Mother   . Breast cancer Mother   . Colon polyps Mother   . Heart attack Father   . Cirrhosis Sister   . Diabetes Sister   . Heart attack Sister     x 2 sisters  . Heart disease Brother   . Diabetes Brother   . Ovarian cancer Sister     x 2 sisters   History  Substance Use Topics  . Smoking status: Never Smoker   . Smokeless tobacco: Never Used     Comment: exposed to 2nd hand smoke  . Alcohol Use: No   Current Outpatient Prescriptions  Medication Sig Dispense Refill  . albuterol (PROAIR HFA) 108 (90 BASE) MCG/ACT inhaler Inhale 2 puffs into the lungs every 6 (six) hours as needed. For rescue/shortness of breath      . atorvastatin (LIPITOR) 80 MG tablet Take 1 tablet (80 mg total) by mouth at bedtime.  30 tablet  6  . azelastine (ASTELIN) 137 MCG/SPRAY nasal spray Place 1 spray into both nostrils 2 (two) times daily. Use in each nostril as directed      . benzonatate (TESSALON) 100 MG capsule Take 200 mg by mouth 3 (three) times daily.       . bisacodyl (DULCOLAX) 5 MG  EC tablet Take 5 mg by mouth daily as needed for constipation.      . docusate sodium 100 MG CAPS Take 100 mg by mouth 2 (two) times daily.  10 capsule  0  . donepezil (ARICEPT) 10 MG tablet Take 10 mg by mouth at bedtime.       . Esomeprazole Magnesium (NEXIUM PO) Take 1 capsule by mouth at bedtime.      [name] ezetimibe (ZETIA) 10 MG tablet Take 10 mg by mouth at bedtime.      . fexofenadine (ALLEGRA) 180 MG tablet Take 180 mg by mouth daily.      . fluticasone (FLONASE) 50 MCG/ACT nasal spray Place 2 sprays into the nose 2 (two) times daily.      . furosemide (LASIX) 40 MG tablet Take 40 mg by mouth daily.      [name] HYDROcodone-homatropine (HYCODAN) 5-1.5 MG/5ML syrup Take 5 mLs by mouth 3 times/day as needed-between meals &  bedtime for cough.      . Ipratropium-Albuterol (COMBIVENT RESPIMAT) 20-100 MCG/ACT AERS respimat Inhale 1 puff into the lungs every 6 (six) hours.      [name] ipratropium-albuterol (DUONEB) 0.5-2.5 (3) MG/3ML SOLN Take 3 mLs by nebulization every 4 (four) hours as needed. For shortness of breath      . LORazepam (ATIVAN) 0.5 MG tablet Take 0.5 mg by mouth 3 (three) times daily.      . Mometasone Furo-Formoterol Fum (DULERA) 200-5 MCG/ACT AERO Inhale 2 puffs into the lungs 3 (three) times daily.      . montelukast (SINGULAIR) 10 MG tablet Take 10 mg by mouth at bedtime.      . Multiple Vitamin (MULITIVITAMIN WITH MINERALS) TABS Take 1 tablet by mouth every morning.       . pantoprazole (PROTONIX) 40 MG tablet Take 1 tablet by mouth daily.      . potassium chloride SA (K-DUR,KLOR-CON) 20 MEQ tablet Take 20 mEq by mouth 2 (two) times daily.      . prasugrel (EFFIENT) 5 MG TABS tablet Take 1 tablet (5 mg total) by mouth every morning.  30 tablet  6  . predniSONE (DELTASONE) 10 MG tablet Take 4 tabs for 3 days starting 08/29/13 then take 2 tabs for 3 days then take 1 tab for 3 days then stop.  21 tablet  0  . Probiotic Product (TRUBIOTICS) CAPS Take 1 capsule by mouth daily.      . traMADol (ULTRAM) 50 MG tablet Take 2 tablets by mouth every 6 (six) hours as needed for moderate pain.       . traZODone (DESYREL) 50 MG tablet Take 50 mg by mouth at bedtime.       No current facility-administered medications for this visit.   Allergies  Allergen Reactions  . Aspirin Hives  . Metoclopramide Hcl     REACTION: intolerant, restless hands  . Morphine Hives  . Adhesive [Tape] Itching and Rash    ONLY TO USE PAPER TAPE  . Codeine Itching and Rash  . Penicillins Itching and Rash  . Sulfonamide Derivatives Itching and Rash   Review of Systems: Positive for allergy/ sinus problems, anxiety, back pain, cough, fatigue, depression, fever, headaches, itching, SOB, sleeping problems, sore throat, urine leakage and  voice changes. All systems reviewed and negative except where noted in HPI.   Dg Swallowing Func-speech Pathology  08/27/2013   MBSS revealed mild pharyngeal phase  dysphagia characterized by premature spillage of liquids into  pharynx with delay in  swallow initiation, decreased epiglottic  deflection resulting in penetration (silent and audible/cough) of  thins during the swallow without aspiration. Delayed initiation  evident across all consistencies/textures triggering after  filling the valleculae. Pt also noted to have prominent  cricopharyngeal bar. Pt grimmaced when swallowing solids and  elicited effortful swallow to pass through UES. Pharyngeal  strength appears within functional limits as pt with little to no  residuals in pharynx. Chin tuck was attempted however penetration  persisted. Pt appears to have good sensation, however her  coughing seems out of proportion to the penetration. Esophageal  sweep was essentially unremarkable except for prominent  cricopharyngeus.  Recommend D3/mech soft and  thin liquids. Pt needs to take small sips. She did well with  nectar-thick liquids and if she appears to tolerate them better.  Treatment Recommendation  No treatment recommended at this time    Diet Recommendation Dysphagia 3 (Mechanical Soft);Thin liquid   Liquid Administration via: Cup Medication Administration: Crushed with puree Supervision:    Physical Exam: BP 120/70  Pulse 84  Ht 5\' 5"  (1.651 m)  Wt 180 lb 6 oz (81.818 kg)  BMI 30.02 kg/m2 Constitutional: Pleasant,well-developed, white female in no acute distress. HEENT: Normocephalic and atraumatic. Conjunctivae are normal. No scleral icterus. Neck supple.  Cardiovascular: Normal rate, regular rhythm.  Pulmonary/chest: Effort normal and breath sounds normal. No wheezing, rales or rhonchi. Abdominal: Soft, nondistended, nontender. Bowel sounds active throughout. There are no masses palpable. No hepatomegaly. Rectal: large external  hemorrhoids on exam Extremities: no edema Lymphadenopathy: No cervical adenopathy noted. Neurological: Alert and oriented to person place and time. Skin: Skin is warm and dry. No rashes noted. Psychiatric: Normal mood and affect. Behavior is normal.  MBSS Mild pharyngeal phase dysphagia;Mild cervical esophageal phase dysphagia, also noted to have prominent cricopharyngeal bar.Pt appears to have good sensation, however her coughing seems out of proportion to the penetration. Esophageal sweep was essentially unremarkable except for prominent cricopharyngeus. Coughing episodes may be due to a combination of reflux, lung function, and penetration    ASSESSMENT AND PLAN:  56. 73 year old female with 4 or so month history of solid food dysphagia and "choking" on liquids. Recent MBSS reveals mild pharyngeal dysphagia and prominent cricopharyngeal bar,  esophageal sweep was otherwise unremarkable. Patient tolerating mechanical soft diet with chin tuck maneuvers. Will need to discuss this case with patient's primary GI, Dr. Deatra Ina to see whether he thinks it would be worthwhile to do an EGD for assessment of UES. Patient would probably need to be off Effient for the procedure and her co-morbidities put her at increased risk for procedures. Will call patient back with recommendations.    2. External hemorrhoids. Trial of steroid cream.   3. Constipation refractory to several agents. Discussed Linzess, daughter understandably doesn't want to add another medication. Recommend adding metamucil at least.   4. Multiple medical problems, on multiple medications including Effient for history of stents two years ago. She also has a history of DVT/ PE

## 2013-09-06 NOTE — Progress Notes (Signed)
Reviewed and agree with management.  Would proceed with barium swallow to determine whether she has a fixed stricture in the more distal esophagus. Sandy Salaam. Deatra Ina, M.D., [facility]

## 2013-09-09 ENCOUNTER — Other Ambulatory Visit: Payer: Self-pay

## 2013-09-09 DIAGNOSIS — R1319 Other dysphagia: Secondary | ICD-10-CM

## 2013-09-09 NOTE — Progress Notes (Signed)
Pt scheduled for barium esophagram at [facility] 09/13/13@10 :30am. Pt to arrive there at 10:15am. Pt aware of appt date and time.

## 2013-09-11 ENCOUNTER — Telehealth: Payer: Self-pay | Admitting: Gastroenterology

## 2013-09-11 NOTE — Telephone Encounter (Signed)
Daughter states that pt is having a melanoma removed from her back tomorrow and she would like to reschedule the pts xray procedure on Friday. Daughter given the phone number [phone] to reschedule the xray.

## 2013-09-12 ENCOUNTER — Other Ambulatory Visit: Payer: Self-pay | Admitting: Dermatology

## 2013-09-13 ENCOUNTER — Ambulatory Visit (HOSPITAL_COMMUNITY): Payer: Medicare Other

## 2013-09-24 ENCOUNTER — Telehealth: Payer: Self-pay | Admitting: Gastroenterology

## 2013-09-24 ENCOUNTER — Other Ambulatory Visit: Payer: Self-pay | Admitting: Cardiovascular Disease

## 2013-09-24 NOTE — Telephone Encounter (Signed)
Spoke with patient and gave her radiology number to reschedule procedure.

## 2013-09-26 ENCOUNTER — Telehealth: Payer: Self-pay | Admitting: *Deleted

## 2013-09-26 NOTE — Telephone Encounter (Signed)
Patient calling to have samples of effient. Is unable to pay copay at this time. Conformed with PhamD Ysidro Evert it's ok to split 10mg  tabs and possibly the patient seeking another alternative other than Effient. Patient states she is allergic to Plavix but is willing to pay less for another rx if  Dr Acie Fredrickson knows another.  Will Route to [place].

## 2013-09-26 NOTE — Telephone Encounter (Signed)
I will route to Dr Acie Fredrickson.

## 2013-09-27 NOTE — Telephone Encounter (Signed)
I dont have any other suggestions.    Carrie [name] smart had suggested 1/2 tab a day.

## 2013-09-30 ENCOUNTER — Telehealth: Payer: Self-pay | Admitting: *Deleted

## 2013-09-30 ENCOUNTER — Ambulatory Visit (HOSPITAL_COMMUNITY): Payer: Medicare Other

## 2013-09-30 NOTE — Telephone Encounter (Signed)
Pt can not afford to get Effient/ I will forward to Carrie Sox Rn to see if there is a prescription plan that may work for her. Pt was told to call refills with further need, she verbalized understanding.

## 2013-09-30 NOTE — Telephone Encounter (Signed)
Patient assistance program [employer] forms mailed to patient to see if she qualifies for EFFIENT assistance. Will get Dr Acie Fredrickson to sign script on 10/02/2013

## 2013-09-30 NOTE — Telephone Encounter (Signed)
Message copied by Fernande Boyden on Mon Sep 30, 2013  2:53 PM ------      Message from: Jonathon Jordan      Created: Mon Sep 30, 2013  8:23 AM       Please see if she qualifies for a program for effient.      Thank you Gar Ponto ------

## 2013-10-03 NOTE — Telephone Encounter (Signed)
Faxed signed script and physician information to [facility], still waiting on patients application to fax.

## 2013-10-06 IMAGING — CR DG CHEST 2V
2 series · 2 of 2 positions shown · non-contrast
Comparison: 08/02/2007

CLINICAL DATA: Shortness of breath and cough.

CHEST - 2 VIEW

[view not recorded (1 of 2)]
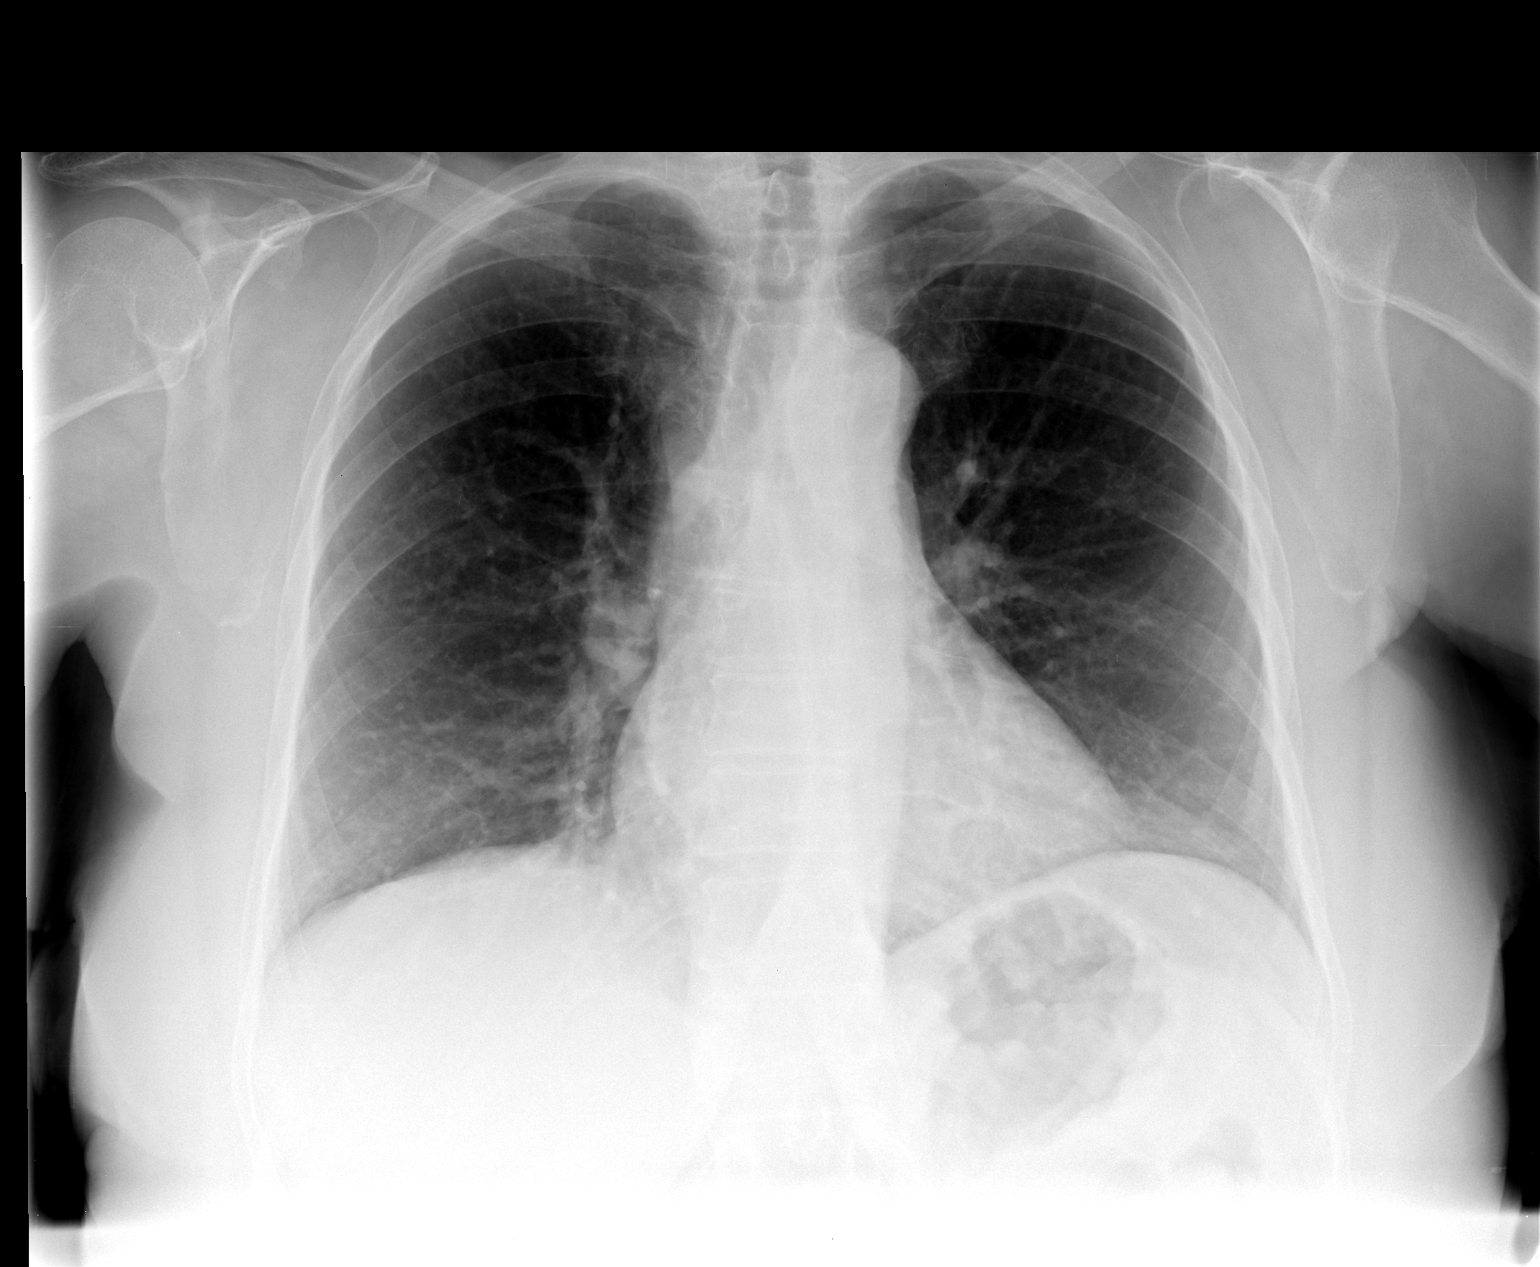

[view not recorded (2 of 2)]
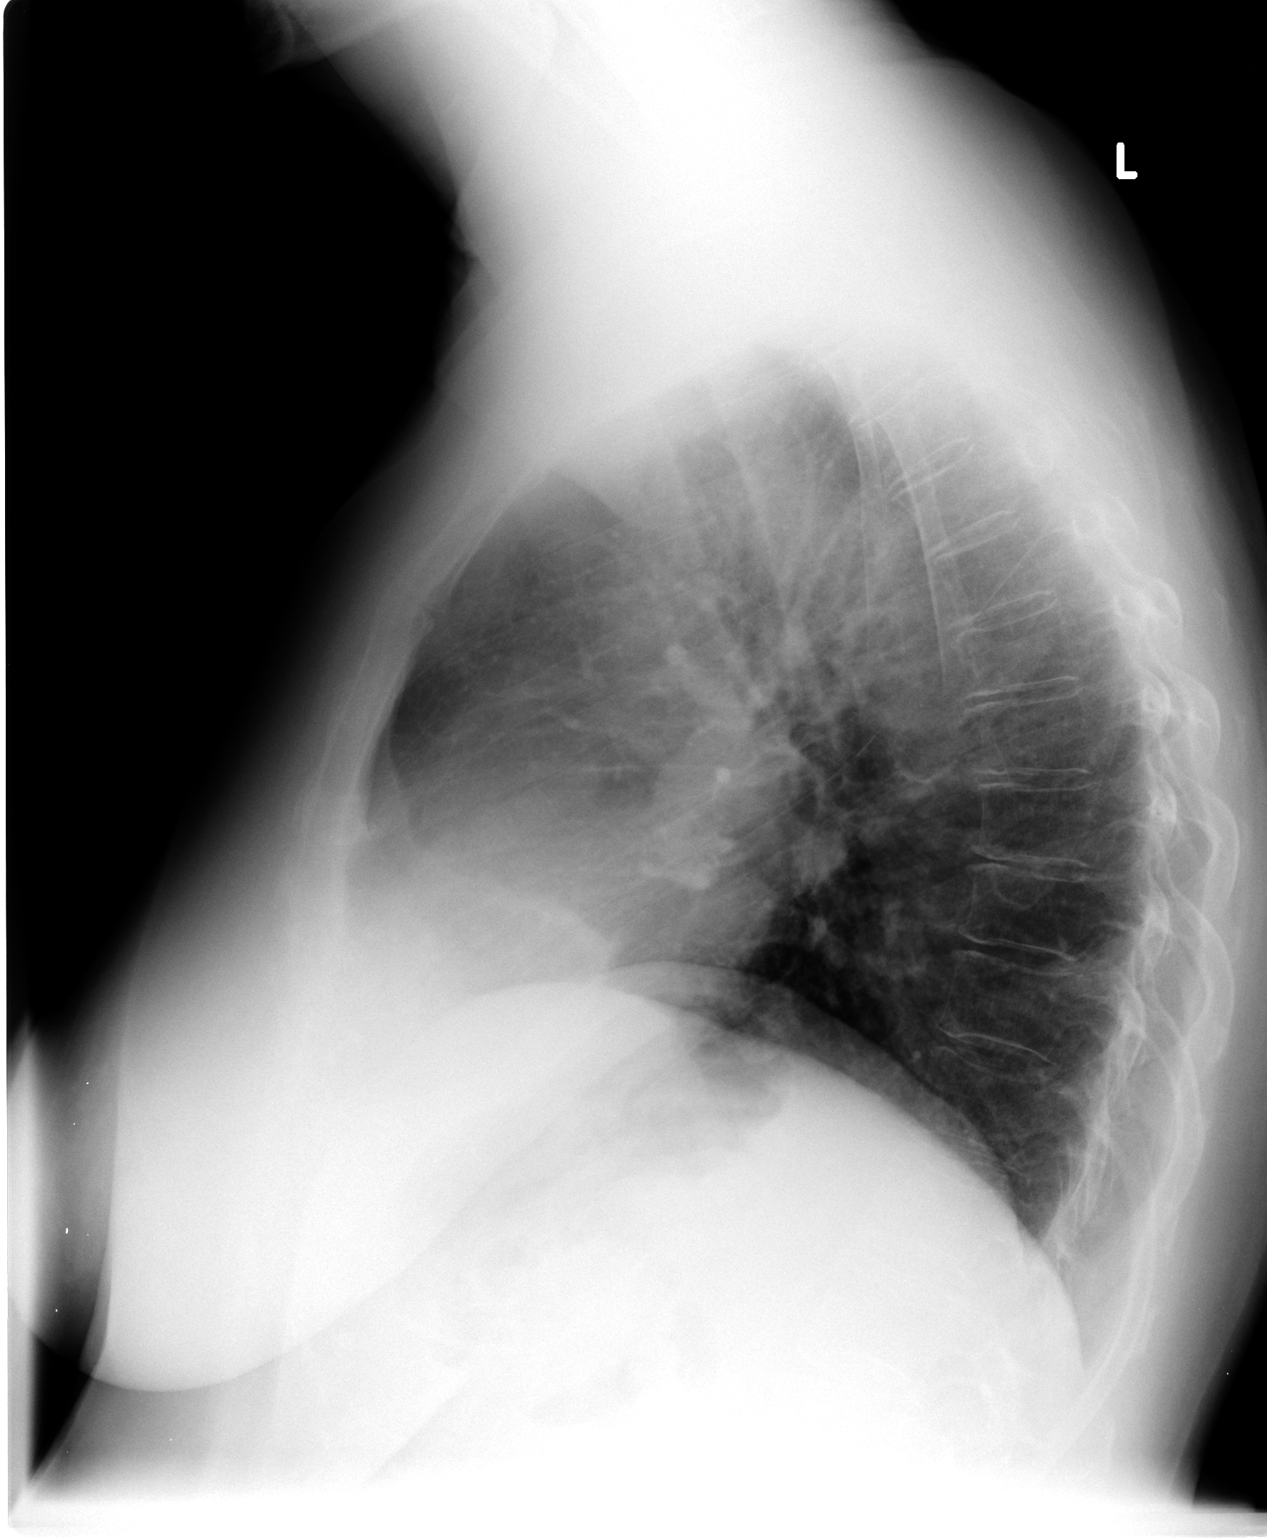

[2 of 2 positions shown; findings below may reference images not displayed]

FINDINGS: The heart size and mediastinal contours are within normal
limits.  Both lungs are clear.  The visualized skeletal structures
are unremarkable.
IMPRESSION: No active disease.

## 2013-11-21 ENCOUNTER — Telehealth: Payer: Self-pay | Admitting: *Deleted

## 2013-11-21 NOTE — Telephone Encounter (Signed)
Patient requests effient samples. I will place at the front desk for pick up.

## 2013-12-02 ENCOUNTER — Telehealth: Payer: Self-pay | Admitting: Gastroenterology

## 2013-12-02 MED ORDER — HYDROCORTISONE ACETATE 25 MG RE SUPP: 25.0000 mg | Freq: Two times a day (BID) | RECTAL | Status: DC

## 2013-12-02 NOTE — Telephone Encounter (Signed)
Pt is having a hard time with hemorrhoids. Would like something sent in to help. Pt has a cream that was mixed up at [employer] and states this is not working. It has promoxine, lidocaine, hydrocortisone, and phenylepherine powder in it. Please advise what else she can try.

## 2013-12-02 NOTE — Telephone Encounter (Signed)
Pt aware and script sent to pharmacy. 

## 2013-12-02 NOTE — Telephone Encounter (Signed)
Try a stool softener daily Warm soaks twice a day Anusol HC suppositories each bedtime and every morning If no better in 3-4 days she needs an office visit

## 2013-12-25 ENCOUNTER — Other Ambulatory Visit (HOSPITAL_COMMUNITY): Payer: Self-pay | Admitting: Cardiovascular Disease

## 2013-12-25 ENCOUNTER — Other Ambulatory Visit: Payer: Self-pay | Admitting: Cardiovascular Disease

## 2014-01-13 ENCOUNTER — Other Ambulatory Visit: Payer: Self-pay | Admitting: Dermatology

## 2014-01-16 ENCOUNTER — Other Ambulatory Visit: Payer: Self-pay | Admitting: General Surgery

## 2014-01-16 ENCOUNTER — Telehealth: Payer: Self-pay

## 2014-01-16 MED ORDER — PRASUGREL HCL 5 MG PO TABS: 5.0000 mg | ORAL_TABLET | Freq: Every morning | ORAL | Status: DC

## 2014-01-16 NOTE — Telephone Encounter (Signed)
Called patient to let her know that i placed samples of effient up front for her

## 2014-02-03 ENCOUNTER — Telehealth: Payer: Self-pay | Admitting: *Deleted

## 2014-02-03 ENCOUNTER — Encounter: Payer: Self-pay | Admitting: Gastroenterology

## 2014-02-03 ENCOUNTER — Ambulatory Visit (INDEPENDENT_AMBULATORY_CARE_PROVIDER_SITE_OTHER): Payer: Medicare Other | Admitting: Gastroenterology

## 2014-02-03 VITALS — BP 120/80 | HR 76 | Ht 65.0 in | Wt 179.4 lb

## 2014-02-03 DIAGNOSIS — K648 Other hemorrhoids: Secondary | ICD-10-CM

## 2014-02-03 DIAGNOSIS — J449 Chronic obstructive pulmonary disease, unspecified: Secondary | ICD-10-CM

## 2014-02-03 DIAGNOSIS — R1310 Dysphagia, unspecified: Secondary | ICD-10-CM

## 2014-02-03 DIAGNOSIS — K5900 Constipation, unspecified: Secondary | ICD-10-CM

## 2014-02-03 DIAGNOSIS — Z86711 Personal history of pulmonary embolism: Secondary | ICD-10-CM

## 2014-02-03 NOTE — Assessment & Plan Note (Addendum)
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    Recommendations #1 upper endoscopy with balloon dilation as indicated.  I will check with her cardiologist to determine whether effient can be held or if she needs a Lovenox bridge.

## 2014-02-03 NOTE — Assessment & Plan Note (Signed)
She probably has idiopathic constipation.  My knowledge she has not had a colonoscopy.  Plan colonoscopy at the same time as her upper endoscopy if we can hold her anticoagulation medication.

## 2014-02-03 NOTE — Assessment & Plan Note (Signed)
Patient is symptomatically hemorrhoids despite medical therapy.  Band ligation may certainly help her symptoms although  anticoagulation therapy should be held.  Hemorrhoids may also be contributing to her fecal incontinence.  Recommendations #1 colonoscopy with possible band ligation.  At least one then can be placed.  Colonoscopy will be scheduled at the time of her upper endoscopy while she is transiently off anticoagulation medication, if approved by her cardiologist.

## 2014-02-03 NOTE — Telephone Encounter (Signed)
Call [phone] Call Gelene Mink (daughter)

## 2014-02-03 NOTE — Telephone Encounter (Signed)
02/03/2014   RE: CANDACE BEGUE DOB: 04-08-1941 MRN: [id number]   Dear Acie Fredrickson,   We have scheduled the above patient for an endoscopic procedure. Our records show that she is on anticoagulation therapy.   Please advise as to how long the patient may come off her therapy of  Effient prior to the procedure, which is scheduled for .  Please fax back/ or route the completed form to [place]  at [phone].   Sincerely,    Genella Mech

## 2014-02-03 NOTE — Progress Notes (Signed)
      History of Present Illness:  Ms. Carrie [name] has returned for followup of dysphagia and for evaluation of rectal pain.  She continues to have dysphagia to solids.  She has a history of an esophageal stricture which was dilated years ago.  A modified barium swallow demonstrated a prominent cricopharyngeus.  She's also complaining of severe frequent rectal pain.  She has a protrusion of hemorrhoids with scant bleeding but marked rectal discomfort.  She also leaks stool when she coughs or sneezes.  She tends to be constipated and frequently takes laxatives.  She's not had a colonoscopy to her knowledge.  She's tried various topical ointments and suppositories for hemorrhoids.  Patient has oxygen-dependent COPD.  She takes effient for history of pulmonary emboli.    Review of Systems: Pertinent positive and negative review of systems were noted in the above HPI section. All other review of systems were otherwise negative.    Current Medications, Allergies, Past Medical History, Past Surgical History, Family History and Social History were reviewed in [name] record  Vital signs were reviewed in today's medical record. Physical Exam: General: Well developed , well nourished, no acute distress Skin: anicteric Head: Normocephalic and atraumatic Eyes:  sclerae anicteric, EOMI Ears: Normal auditory acuity Mouth: No deformity or lesions Lungs: Clear throughout to auscultation Heart: Regular rate and rhythm; no murmurs, rubs or bruits Abdomen: Soft, non tender and non distended. No masses, hepatosplenomegaly or hernias noted. Normal Bowel sounds Rectal: She has external hemorrhoids Musculoskeletal: Symmetrical with no gross deformities  Pulses:  Normal pulses noted Extremities: No clubbing, cyanosis, edema or deformities noted Neurological: Alert oriented x 4, grossly nonfocal Psychological:  Alert and cooperative. Normal mood and affect  See Assessment and Plan under  Problem List

## 2014-02-03 NOTE — Patient Instructions (Addendum)
Your hospital procedure has been scheduled on 02/25/2014 at 8:30am  At [facility] We will call you and let you know when to hold your blood thinner

## 2014-02-03 NOTE — Telephone Encounter (Signed)
Carrie [name] may stop Effient for 7 days prior to colonoscopy.  Resume the night of procedure

## 2014-02-04 ENCOUNTER — Telehealth: Payer: Self-pay | Admitting: Gastroenterology

## 2014-02-04 MED ORDER — NA SULFATE-K SULFATE-MG SULF 17.5-3.13-1.6 GM/177ML PO SOLN: 1.0000 | Freq: Once | ORAL | Status: DC

## 2014-02-04 NOTE — Telephone Encounter (Signed)
Contacted patients daughter  Patient is aware to hold Effient 7 days before procedure

## 2014-02-04 NOTE — Telephone Encounter (Signed)
Medication Suprep sent to pharmacy. L/M for patient

## 2014-02-05 ENCOUNTER — Other Ambulatory Visit: Payer: Self-pay | Admitting: *Deleted

## 2014-02-05 ENCOUNTER — Encounter: Payer: Self-pay | Admitting: Cardiovascular Disease

## 2014-02-05 ENCOUNTER — Ambulatory Visit (INDEPENDENT_AMBULATORY_CARE_PROVIDER_SITE_OTHER): Payer: Medicare Other | Admitting: Cardiovascular Disease

## 2014-02-05 VITALS — BP 125/85 | HR 68 | Ht 66.0 in | Wt 180.0 lb

## 2014-02-05 DIAGNOSIS — I2510 Atherosclerotic heart disease of native coronary artery without angina pectoris: Secondary | ICD-10-CM

## 2014-02-05 DIAGNOSIS — R42 Dizziness and giddiness: Secondary | ICD-10-CM

## 2014-02-05 DIAGNOSIS — I7090 Unspecified atherosclerosis: Secondary | ICD-10-CM

## 2014-02-05 NOTE — Patient Instructions (Signed)
Your physician recommends that you continue on your current medications as directed. Please refer to the Current Medication list given to you today.  Your physician wants you to follow-up in: 6 months with Dr. Nahser.  You will receive a reminder letter in the mail two months in advance. If you don't receive a letter, please call our office to schedule the follow-up appointment.  

## 2014-02-05 NOTE — Assessment & Plan Note (Signed)
She has a hx of stenting in the  Past.  She is on chronic effient.  She is intolerant to asirin. Will hold the Effient for 7 days prior to GI procedure ( esophageal dilitation, colonoscopy, hemorrhoid banding)  Restart effient the night of the procedure if OK with GI.  I will see her in 6 months.

## 2014-02-05 NOTE — Progress Notes (Signed)
Carrie [name] Date of Birth  February 26, 1941       [facility] [address], [place], [place]  [address]   [place], [place]  [address] [phone]     [phone]   Fax  [phone]    Fax [phone]  Problem List: 1. pulmonary emboli - presented with syncope,  She was tried on Coumadin but she was not able to regulated and it up having a very major GI bleed. She was p;aced on Xarelto but is not able to afford it.    She has stopped the Xarelto as of 07/19/13.  2. Hyperlipidemia 3. severe asthma 4. CAD - s/p stenting of LAD-  October, 2013, she is intolerant to Aspirin and is a non-responder to Plavix.  She should stay on Effient ( or similar) lifelong. 5. Severe Asthma -   History of Present Illness:  Carrie [name] is a 73 yo who I have seen in the past.  She has been having problems with syncope.  She has a strong family  hx of CAD.  She has been having chest pressure - associated with severe dyspnea.  This occurs in the middle of the night.  It occasionally eases off with Tums or Pepsi.  The pain radiates to her left breast and to her left shoulder blade / left arm.  She has profuse diaphoresis.   These episodes of diaphoresis are followed by syncope.    These episodes of syncope have occurred as much as several times a week.   She was admitted to the hospital recently with an episode of syncope and chest pain.  She was found to have bilateral pulmonary embolus and significant CAD. She had a bare metal stent to the LAD, was treated with Effient.  She was treated with coumadin, and shoulders FE for 3 months. The plan is to reduce the dose of Effient to 5 mg a day after the initial 3 months.  She has had some episodes of chest pain since he left the hospital. Her daughter thinks that there may be due to anxiety. She also has significant asthma and is not able to ambulate mostly because of her asthma. She has a tight asthmatic  cough. The cough seems to have worsened over the past couple of weeks.  She's developed some hypoxia walking back from the front of the office today. Her O2 saturation was 92%.  She denies bleeding.  July 09, 2012: She was admitted to  [facility] for a viral infection resulting is severe asthma.   She has a hx of severe asthma and has had progressive asthma problems.  She has fallen several times and has had lots of bleeding from her wounds on arms and legs.    She also has had some CP and has taken NTG.    Jan. 30, 2015:  Carrie [name]  is seen today for a one-year followup visit. Since that seen her, she was hospitalized for chest pain. Repeat cardiac catheterization in June revealed minor coronary artery regular disease and a minimal amount of in-stent restenosis. She has also been seen by the neurologist for further evaluation of lightheadedness and "spells"  . She has had bilateral carotid duplex scans which revealed mild bilateral carotid irregularities.  She had a  cardiac monitor placed. Unfortunately the monitor only work for one day and she did not have any irregularities.  She was originally told that she would  not have to pay for the monitor but the daughter and the pain the bill when it arrives.  She's been having difficulty paying for her Xarelto.  She also takes Effient  because she does not respond to Plavix and is allergic to aspirin. Therefore, Effient is her only choice.  Her daughter is going to try to get a free supply from [facility].   February 05, 2014:  She was admitted to the hospital this past March with respiratory difficulties. She spent some time in a skilled nursing facility. She still has some episodes of loss of balance. She tends to drift off to one side or the other when she is walking/  she also gets very dizzy when she looks up.    Her symptoms sound like vertigo to me.   She needs to have several jumper Cedar's in September. She needs to have esophageal dilatation  patient also needs to have a colonoscopy. She also will have hemorrhoid banding. We will be holding her Effient  for 5-7 days before that.   Current Outpatient Prescriptions on File Prior to Visit  Medication Sig Dispense Refill  . albuterol (PROAIR HFA) 108 (90 BASE) MCG/ACT inhaler Inhale 2 puffs into the lungs every 6 (six) hours as needed. For rescue/shortness of breath      . atorvastatin (LIPITOR) 80 MG tablet TAKE ONE TABLET BY MOUTH EVERY NIGHT AT BEDTIME  30 tablet  0  . azelastine (ASTELIN) 137 MCG/SPRAY nasal spray Place 1 spray into both nostrils 2 (two) times daily. Use in each nostril as directed      . benzonatate (TESSALON) 100 MG capsule Take 200 mg by mouth 3 (three) times daily.       . bisacodyl (DULCOLAX) 5 MG EC tablet Take 5 mg by mouth daily as needed for constipation.      . docusate sodium 100 MG CAPS Take 100 mg by mouth 2 (two) times daily.  10 capsule  0  . donepezil (ARICEPT) 10 MG tablet Take 10 mg by mouth at bedtime.       . Esomeprazole Magnesium (NEXIUM PO) Take 20 mg by mouth every evening.       . ezetimibe (ZETIA) 10 MG tablet Take 10 mg by mouth at bedtime.      . fexofenadine (ALLEGRA) 180 MG tablet Take 180 mg by mouth daily.      . fluticasone (FLONASE) 50 MCG/ACT nasal spray Place 2 sprays into the nose 2 (two) times daily.      . furosemide (LASIX) 40 MG tablet Take 40 mg by mouth daily.      . hydrocortisone (ANUSOL-HC) 2.5 % rectal cream Place 1 application rectally at bedtime. X 7 days  30 g  0  . hydrocortisone (ANUSOL-HC) 25 MG suppository Place 1 suppository (25 mg total) rectally 2 (two) times daily.  12 suppository  2  . ipratropium-albuterol (DUONEB) 0.5-2.5 (3) MG/3ML SOLN Take 3 mLs by nebulization every 4 (four) hours as needed. For shortness of breath      . LORazepam (ATIVAN) 0.5 MG tablet Take 0.5 mg by mouth 3 (three) times daily.      . Mometasone Furo-Formoterol Fum (DULERA) 200-5 MCG/ACT AERO Inhale 2 puffs into the lungs 3 (three)  times daily.      . montelukast (SINGULAIR) 10 MG tablet Take 10 mg by mouth at bedtime.      . Multiple Vitamin (MULITIVITAMIN WITH MINERALS) TABS Take 1 tablet by mouth every morning.       [name]  Na Sulfate-K Sulfate-Mg Sulf (SUPREP BOWEL PREP) SOLN Take 1 kit by mouth once.  1 Bottle  0  . pantoprazole (PROTONIX) 40 MG tablet Take 1 tablet by mouth every morning.       . potassium chloride SA (K-DUR,KLOR-CON) 20 MEQ tablet Take 20 mEq by mouth 2 (two) times daily.      . prasugrel (EFFIENT) 5 MG TABS tablet Take 1 tablet (5 mg total) by mouth every morning.  30 tablet  0  . Probiotic Product (TRUBIOTICS) CAPS Take 1 capsule by mouth daily.      . traZODone (DESYREL) 50 MG tablet Take 50 mg by mouth at bedtime.       No current facility-administered medications on file prior to visit.    Allergies  Allergen Reactions  . Aspirin Hives  . Metoclopramide Hcl     REACTION: intolerant, restless hands  . Morphine Hives  . Adhesive [Tape] Itching and Rash    ONLY TO USE PAPER TAPE  . Codeine Itching and Rash  . Penicillins Itching and Rash  . Sulfonamide Derivatives Itching and Rash    Past Medical History  Diagnosis Date  . Hyperlipidemia   . Anxiety   . Chronic asthma   . Persistent headaches   . PONV (postoperative nausea and vomiting) 1983  . Arteriosclerotic cardiovascular disease (ASCVD)      single vessel CAD involving the mid LAD, s/p BMS to mid LAD 50/2/77, nl LV systolic fxn  . Pulmonary embolism     Rx: lifelong coumadin 03/2012  . Chronic anticoagulation     Warfarin led to variable INRs; now Rx with rivaroxaban  . Carotid stenosis     Carotid US 7/14:  Bilat.  ICA 1-39% => f/u 1 yr.  [name] GERD (gastroesophageal reflux disease)   . Dementia   . CAD (coronary artery disease)   . Cough   . Syncope and collapse   . Memory deficit   . Benign paroxysmal positional vertigo 01/30/2013  . COPD (chronic obstructive pulmonary disease)   . Melanoma in situ of back   . Hiatal  hernia 06/11/2008  . Diverticulosis of colon (without mention of hemorrhage) 09/09/2008  . Arthritis   . Asthma   . Depression   . Status post dilation of esophageal narrowing   . Pneumonia   . Oxygen dependent     at night    Past Surgical History  Procedure Laterality Date  . Tubal ligation      with appendectomy  . Ankle reconstruction Left   . Rotator cuff repair Left 06/2010    bone spurs left  . Coronary stent placement  03/26/2012  . Coronary angioplasty with stent placement    . Breast cyst excision Bilateral     4-5 surgeries  . Esophagogastroduodenoscopy  06/11/2008    [facility]  . Colonoscopy  09/09/2008    562.10  . Melanoma excision      back  . Carpal tunnel release Right     History  Smoking status  . Never Smoker   Smokeless tobacco  . Never Used    Comment: exposed to 2nd hand smoke    History  Alcohol Use No    Family History  Problem Relation Age of Onset  . Heart attack Mother   . Breast cancer Mother   . Colon polyps Mother   . Heart attack Father   . Cirrhosis Sister   . Diabetes Sister   . Heart attack Sister  x 2 sisters  . Heart disease Brother   . Diabetes Brother   . Ovarian cancer Sister     x 2 sisters    Reviw of Systems:  Reviewed in the HPI.  All other systems are negative.  Physical Exam: Blood pressure 125/85, pulse 68, height $RemoveBe'5\' 6"'[id number]$  (1.676 m), weight 180 lb (81.647 kg). General: Well developed, well nourished, in no acute distress.  Head: Normocephalic, atraumatic, sclera non-icteric, mucus membranes are moist,   Neck: Supple. Carotids are 2 + without bruits. No JVD  Lungs: She has a few wheezes with expiration. She states that these wheezes are fairly common for her.  Heart: regular rate.  normal  S1 S2. No murmurs, gallops or rubs.  Abdomen: Soft, non-tender, non-distended with normal bowel sounds. No hepatomegaly. No rebound/guarding. No masses.  Msk:  Strength and tone are normal  Extremities: No clubbing or  cyanosis. No edema.  Distal pedal pulses are 2+ and equal bilaterally.  Neuro: Alert and oriented X 3. Moves all extremities spontaneously.  Psych:  Responds to questions appropriately with a normal affect.  ECG: Oct. 1, 2013 - NSR at 73,  Low voltage QRS.  O2 sat after walking was 92%.  Assessment / Plan:

## 2014-02-05 NOTE — Assessment & Plan Note (Signed)
She is having lots of vertigo symptoms.  I have asked her to consult her medical doctor about these.  I will see her in 6 months.

## 2014-02-06 ENCOUNTER — Encounter (HOSPITAL_COMMUNITY): Payer: Self-pay | Admitting: Pharmacy Technician

## 2014-02-11 ENCOUNTER — Encounter: Payer: Self-pay | Admitting: Adult Health

## 2014-02-11 ENCOUNTER — Ambulatory Visit (INDEPENDENT_AMBULATORY_CARE_PROVIDER_SITE_OTHER): Payer: Medicare Other | Admitting: Adult Health

## 2014-02-11 VITALS — BP 137/71 | HR 75 | Ht 65.0 in | Wt 180.0 lb

## 2014-02-11 DIAGNOSIS — R42 Dizziness and giddiness: Secondary | ICD-10-CM

## 2014-02-11 NOTE — Progress Notes (Addendum)
PATIENT: Carrie [name] DOB: February 22, 1941  REASON FOR VISIT: follow up HISTORY FROM: patient  HISTORY OF PRESENT ILLNESS: Carrie [name] is a 73 year old female with a history of benign proximal position vertigo. She returns to this office for follow-up. She was sent for vestibular rehab and reports that it was not very beneficial but the dizziness finally resolved on its on. She states in the last month it has returned. She went to her cardiologist who doesn't believe it's related to her heart. The patient states that the dizziness is different this time. She states that the room does not spin but instead she feels like she is floating. It normally does occur with a change in position. She states when she rolls over in bed, she experiences it as well as when she bends over to clean out the dishwasher then stands. Her daughter has noticed that when she is walking she tends to run into the walls. She tends to veer to the right. For the last two months she has noticed decreased strength in the left hand. She used to be able to open jars but now she can't. Her PCP decreased her dose of lorazepam to see if that helped the dizziness but the patient and daughter state that it made it worse. She also has been having headaches more than usual. She had an MRI/MRA in 2014.   HISTORY 01/30/13 (CW): female with a history of coronary artery disease, pulmonary embolism, and syncope. The patient gives a history of vertigo that occurred 3 or 4 years ago, and this resolved with vestibular rehabilitation. The patient indicates that one month ago, she rolled over to look at the clock, and she had onset of vertigo that lasted several moments. The patient has had episodes of dizziness since that time that generally occurs with changes in head position. The patient will have dizziness when she lies down, or sits up. The patient will have dizziness if she looks up or looks down. The dizziness will last 1 or 2 minutes, and then  resolve. The patient has had 2 syncopal event on the same day. The patient was standing with these events, and appeared to be pale, clammy. The patient indicates that she did feel some palpitations of the heart. The patient had loss of consciousness for a few moments without stiffening or jerking or tongue biting. The patient has had episodes of near-syncope, and she was able to avoid a blackout by sitting down or lying down. The patient may feel "wiped out" afterwards. The patient has had a coronary artery stent placement within the last year. The patient has also had some problems with headaches over the last one month. The patient denies any focal numbness or weakness of the face, arms, or legs. The patient has been followed for her memory issues, and she has been on low-dose Aricept for one year. MRI evaluation the brain did not show definite stroke, but the study questions a small infarct in the left dorsal brainstem. MRA showed mild intracranial atherosclerosis. Carotid Doppler studies were unremarkable. The patient comes to this office for an evaluation.    REVIEW OF SYSTEMS: Full 14 system review of systems performed and notable only for:  Constitutional: N/A  Eyes: blurred vision  Ear/Nose/Throat: ringing in ear, trouble swallowing   Skin: N/A  Cardiovascular: leg swelling   Respiratory: N/A  Gastrointestinal: constipation, rectal pain  Genitourinary: N/A Hematology/Lymphatic: bruise/bleed easily  Endocrine: N/A Musculoskeletal: muscle cramps Allergy/Immunology: environmental allergies  Neurological:  dizziness, headache Psychiatric: nervous/anxious Sleep: restless leg   ALLERGIES: Allergies  Allergen Reactions  . Aspirin Hives  . Metoclopramide Hcl     REACTION: intolerant, restless hands  . Morphine Hives  . Adhesive [Tape] Itching and Rash    ONLY TO USE PAPER TAPE  . Codeine Itching and Rash  . Penicillins Itching and Rash  . Sulfonamide Derivatives Itching and Rash     HOME MEDICATIONS: Outpatient Prescriptions Prior to Visit  Medication Sig Dispense Refill  . acetaminophen (TYLENOL) 500 MG tablet Take 500-1,000 mg by mouth every 6 (six) hours as needed (Pain).      [name] albuterol (PROAIR HFA) 108 (90 BASE) MCG/ACT inhaler Inhale 2 puffs into the lungs every 6 (six) hours as needed. For rescue/shortness of breath      . atorvastatin (LIPITOR) 80 MG tablet Take 80 mg by mouth daily.      [name] azelastine (ASTELIN) 137 MCG/SPRAY nasal spray Place 1 spray into both nostrils 2 (two) times daily.       . benzonatate (TESSALON) 100 MG capsule Take 200 mg by mouth 3 (three) times daily.       [name] docusate sodium (COLACE) 100 MG capsule Take 200 mg by mouth 2 (two) times daily.      [name] donepezil (ARICEPT) 10 MG tablet Take 10 mg by mouth at bedtime.       [name] esomeprazole (NEXIUM) 20 MG capsule Take 20 mg by mouth every evening.      . ezetimibe (ZETIA) 10 MG tablet Take 10 mg by mouth at bedtime.      . fexofenadine (ALLEGRA) 180 MG tablet Take 180 mg by mouth daily.      . fluticasone (FLONASE) 50 MCG/ACT nasal spray Place 2 sprays into the nose 2 (two) times daily.      . furosemide (LASIX) 40 MG tablet Take 40 mg by mouth daily.      . hydrocortisone (ANUSOL-HC) 2.5 % rectal cream Place 1 application rectally 2 (two) times daily as needed for hemorrhoids or itching.      . hydrocortisone (ANUSOL-HC) 25 MG suppository Place 25 mg rectally 2 (two) times daily as needed for hemorrhoids or itching.      [name] ipratropium-albuterol (DUONEB) 0.5-2.5 (3) MG/3ML SOLN Take 3 mLs by nebulization every 4 (four) hours as needed. For shortness of breath      . LORazepam (ATIVAN) 0.5 MG tablet Take 0.5 mg by mouth 2 (two) times daily.       . Mometasone Furo-Formoterol Fum (DULERA) 200-5 MCG/ACT AERO Inhale 2 puffs into the lungs 3 (three) times daily.      . montelukast (SINGULAIR) 10 MG tablet Take 10 mg by mouth at bedtime.      . Multiple Vitamin (MULITIVITAMIN WITH MINERALS) TABS  Take 1 tablet by mouth every morning.       . Na Sulfate-K Sulfate-Mg Sulf (SUPREP BOWEL PREP) SOLN Take 1 kit by mouth once.  1 Bottle  0  . nitroGLYCERIN (NITROSTAT) 0.4 MG SL tablet Place 0.4 mg under the tongue every 5 (five) minutes as needed for chest pain.       . pantoprazole (PROTONIX) 40 MG tablet Take 1 tablet by mouth every morning.       . potassium chloride SA (K-DUR,KLOR-CON) 20 MEQ tablet Take 20 mEq by mouth daily.       . prasugrel (EFFIENT) 5 MG TABS tablet Take 1 tablet (5 mg total) by mouth every morning.  30 tablet  0  . Probiotic Product (TRUBIOTICS) CAPS Take 1 capsule by mouth daily.      . traZODone (DESYREL) 50 MG tablet Take 50 mg by mouth at bedtime.      [name] tetrahydrozoline 0.05 % ophthalmic solution Place 1 drop into both eyes 3 (three) times daily as needed (Eye irritation).       No facility-administered medications prior to visit.    PAST MEDICAL HISTORY: Past Medical History  Diagnosis Date  . Hyperlipidemia   . Anxiety   . Chronic asthma   . Persistent headaches   . PONV (postoperative nausea and vomiting) 1983  . Arteriosclerotic cardiovascular disease (ASCVD)      single vessel CAD involving the mid LAD, s/p BMS to mid LAD 11/21/99, nl LV systolic fxn  . Pulmonary embolism     Rx: lifelong coumadin 03/2012  . Chronic anticoagulation     Warfarin led to variable INRs; now Rx with rivaroxaban  . Carotid stenosis     Carotid US 7/14:  Bilat.  ICA 1-39% => f/u 1 yr.  [name] GERD (gastroesophageal reflux disease)   . Dementia   . CAD (coronary artery disease)   . Cough   . Syncope and collapse   . Memory deficit   . Benign paroxysmal positional vertigo 01/30/2013  . COPD (chronic obstructive pulmonary disease)   . Melanoma in situ of back   . Hiatal hernia 06/11/2008  . Diverticulosis of colon (without mention of hemorrhage) 09/09/2008  . Arthritis   . Asthma   . Depression   . Status post dilation of esophageal narrowing   . Pneumonia   . Oxygen  dependent     at night    PAST SURGICAL HISTORY: Past Surgical History  Procedure Laterality Date  . Tubal ligation      with appendectomy  . Ankle reconstruction Left   . Rotator cuff repair Left 06/2010    bone spurs left  . Coronary stent placement  03/26/2012  . Coronary angioplasty with stent placement    . Breast cyst excision Bilateral     4-5 surgeries  . Esophagogastroduodenoscopy  06/11/2008    [facility]  . Colonoscopy  09/09/2008    562.10  . Melanoma excision      back  . Carpal tunnel release Right     FAMILY HISTORY: Family History  Problem Relation Age of Onset  . Heart attack Mother   . Breast cancer Mother   . Colon polyps Mother   . Heart attack Father   . Cirrhosis Sister   . Diabetes Sister   . Heart attack Sister     x 2 sisters  . Heart disease Brother   . Diabetes Brother   . Ovarian cancer Sister     x 2 sisters    SOCIAL HISTORY: History   Social History  . Marital Status: Widowed    Spouse Name: N/A    Number of Children: 2  . Years of Education: HS   Occupational History  . retired    Social History Main Topics  . Smoking status: Never Smoker   . Smokeless tobacco: Never Used     Comment: exposed to 2nd hand smoke  . Alcohol Use: No  . Drug Use: No  . Sexual Activity: No   Other Topics Concern  . Not on file   Social History Narrative   Patient is widowed with 2 children.   Patient has a high school education.   Patient is right  handed.   Patient drinks 2 cups daily.      PHYSICAL EXAM  Filed Vitals:   02/11/14 1458 02/11/14 1504  BP: 131/71 139/83  Pulse: 72 71  Height: 5\' 5"  (1.651 m)   Weight: 180 lb (81.647 kg)   Orthostatic BP: lying 150/80, sitting 147/70, standing 152/80  Body mass index is 29.95 kg/(m^2).  Generalized: Well developed, in no acute distress    Neurological examination  Mentation: Alert oriented to time, place, history taking. Follows all commands speech and language fluent Cranial nerve  II-XII: Pupils were equal round reactive to light extraocular movements were full, visual field were full on confrontational test. Facial sensation and strength were normal.  Head turning and shoulder shrug  were normal and symmetric. Motor: The motor testing reveals 5 over 5 strength in the right upper and lower extremity and 4 over 5 strength in the left upper and lower extremity. Decreased handgrip on the left. Good symmetric motor tone is noted throughout.  Sensory: Sensory testing is intact to pinprick and soft touch on all 4 extremities except decreased on the left side.. No evidence of extinction is noted.  Coordination: Cerebellar testing reveals good finger-nose-finger and heel-to-shin bilaterally.  Gait and station: Gait is normal although patient did slightly veer to the right. Tandem gait is slightly unsteady. Romberg is negative but slightly unsteady. No drift is seen.  Reflexes: Deep tendon reflexes are symmetric and normal bilaterally.    DIAGNOSTIC DATA (LABS, IMAGING, TESTING) - I reviewed patient records, labs, notes, testing and imaging myself where available.  Lab Results  Component Value Date   WBC 11.7* 08/27/2013   HGB 11.4* 08/27/2013   HCT 36.0 08/27/2013   MCV 90.9 08/27/2013   PLT 270 08/27/2013      Component Value Date/Time   NA 142 08/27/2013 0449   K 4.7 08/27/2013 0449   CL 103 08/27/2013 0449   CO2 31 08/27/2013 0449   GLUCOSE 134* 08/27/2013 0449   BUN 18 08/27/2013 0449   CREATININE 0.64 08/27/2013 0449   CALCIUM 9.0 08/27/2013 0449   PROT 7.0 12/05/2012 1435   ALBUMIN 3.6 12/05/2012 1435   AST 20 12/05/2012 1435   ALT 22 12/05/2012 1435   ALKPHOS 80 12/05/2012 1435   BILITOT 0.3 12/05/2012 1435   GFRNONAA 87* 08/27/2013 0449   GFRAA >90 08/27/2013 0449   Lab Results  Component Value Date   CHOL 157 03/25/2012   HDL 71 03/25/2012   LDLCALC 64 03/25/2012   TRIG 109 03/25/2012   CHOLHDL 2.2 03/25/2012   No results found for this basename: HGBA1C   No results  found for this basename: VITAMINB12   Lab Results  Component Value Date   TSH 1.791 12/05/2012      ASSESSMENT AND PLAN 73 y.o. year old female  has a past medical history of Hyperlipidemia; Anxiety; Chronic asthma; Persistent headaches; PONV (postoperative nausea and vomiting) (1983); Arteriosclerotic cardiovascular disease (ASCVD); Pulmonary embolism; Chronic anticoagulation; Carotid stenosis; GERD (gastroesophageal reflux disease); Dementia; CAD (coronary artery disease); Cough; Syncope and collapse; Memory deficit; Benign paroxysmal positional vertigo (01/30/2013); COPD (chronic obstructive pulmonary disease); Melanoma in situ of back; Hiatal hernia (06/11/2008); Diverticulosis of colon (without mention of hemorrhage) (09/09/2008); Arthritis; Asthma; Depression; Status post dilation of esophageal narrowing; Pneumonia; and Oxygen dependent. here with:  1. Dizziness  Patient was seen in 2014 by Dr. 2015 and was having dizziness. At that time it was thought that she had positional vertigo. Patient returns today complaining of dizziness  however she feels it is different from before. She does notice the dizziness primarily with position changes however she describes it as a floating feeling rather than the room spinning. She has seen by her cardiologist and he believes it is not related to her heart. On exam she does have some weakness in the left arm and left leg. She has noticed a weakness in the left hand that started approximately 2 months ago. She also has some sensory changes in the left arm and leg. At this time we will repeat an MRI of the brain looking for any acute changes. I will check blood work today looking at her kidney function prior to her MRI. Patient does report that her oxygen level  was 86 % during a recent office visit. She currently wears oxygen at night. I have suggested that she try wearing the oxygen intermittently during the day to see if that alleviates her dizziness.  If the  MRI of the brain is unremarkable, then this could be positional vertigo and the patient may need to be referred for vestibular rehabilitation. I advised the patient to call us if her symptoms worsen or she develops new symptoms. Patient should followup in 3 months or sooner if needed.   Ward Givens, MSN, NP-C 02/11/2014, 3:06 PM [facility] [address], [place],  [address] [phone]  Note: This document was prepared with digital dictation and possible smart phrase technology. Any transcriptional errors that result from this process are unintentional.

## 2014-02-11 NOTE — Patient Instructions (Signed)

## 2014-02-11 NOTE — Progress Notes (Signed)
I have read the note, and I agree with the clinical assessment and plan.  WILLIS,CHARLES KEITH   

## 2014-02-12 ENCOUNTER — Telehealth: Payer: Self-pay | Admitting: *Deleted

## 2014-02-12 DIAGNOSIS — R42 Dizziness and giddiness: Secondary | ICD-10-CM | POA: Insufficient documentation

## 2014-02-12 LAB — BASIC METABOLIC PANEL
BUN / CREAT RATIO: 19 (ref 11–26)
BUN: 13 mg/dL (ref 8–27)
CALCIUM: 9.6 mg/dL (ref 8.7–10.3)
CO2: 27 mmol/L (ref 18–29)
Chloride: 98 mmol/L (ref 97–108)
Creatinine, Ser: 0.7 mg/dL (ref 0.57–1.00)
GFR calc Af Amer: 100 mL/min/{1.73_m2} (ref >59)
GFR, EST NON AFRICAN AMERICAN: 87 mL/min/{1.73_m2} (ref >59)
Glucose: 84 mg/dL (ref 65–99)
Potassium: 4.2 mmol/L (ref 3.5–5.2)
SODIUM: 142 mmol/L (ref 134–144)

## 2014-02-12 NOTE — Progress Notes (Signed)
Quick Note:  Called and left message of normal labs, instructed to call back with any questions or concerns. ______

## 2014-02-12 NOTE — Telephone Encounter (Signed)
I called pt and gave the results of normal labs to pt.  She verbalized understanding.

## 2014-02-13 ENCOUNTER — Other Ambulatory Visit: Payer: Self-pay | Admitting: *Deleted

## 2014-02-13 MED ORDER — NITROGLYCERIN 0.4 MG SL SUBL: 0.4000 mg | SUBLINGUAL_TABLET | SUBLINGUAL | Status: AC | PRN

## 2014-02-17 ENCOUNTER — Encounter (HOSPITAL_COMMUNITY): Payer: Self-pay | Admitting: *Deleted

## 2014-02-18 ENCOUNTER — Encounter: Payer: Self-pay | Admitting: Gastroenterology

## 2014-02-21 ENCOUNTER — Ambulatory Visit
Admission: RE | Admit: 2014-02-21 | Discharge: 2014-02-21 | Disposition: A | Discharge status: Home / Self Care | Payer: Medicare Other | Source: Ambulatory Visit | Attending: Adult Health | Admitting: Adult Health

## 2014-02-21 DIAGNOSIS — R42 Dizziness and giddiness: Secondary | ICD-10-CM

## 2014-02-21 MED ORDER — GADOBENATE DIMEGLUMINE 529 MG/ML IV SOLN
16.0000 mL | Freq: Once | INTRAVENOUS | Status: AC | PRN
  Administered 2014-02-21: 16 mL via INTRAVENOUS

## 2014-02-24 NOTE — Anesthesia Preprocedure Evaluation (Addendum)
Anesthesia Evaluation  Patient identified by MRN, date of birth, ID band Patient awake    Reviewed: Allergy & Precautions, H&P , NPO status , Patient's Chart, lab work & pertinent test results  History of Anesthesia Complications (+) PONV and history of anesthetic complications  Airway Mallampati: III TM Distance: >3 FB Neck ROM: Full    Dental no notable dental hx. (+) Partial Lower   Pulmonary asthma , pneumonia -, resolved, COPD COPD inhaler and oxygen dependent, PE Hx of PE and DVT, initially on warfarin with variable INR, switched to xerelto but could not afford it, currently anticoagulation with effient breath sounds clear to auscultation  Pulmonary exam normal       Cardiovascular + CAD and + Peripheral Vascular Disease Rhythm:Regular Rate:Normal + Systolic murmurs Exercise tolerance currently limited by patient report of severe asthma. Recently saw cardiologists Dr. Acie Fredrickson and per report is ok to hold effient x 7 days prior to procedure. Notes indicate that recent increased dizziness is not secondary to cardiac related events. TTE 10/13 most recent: EF 17-91%, systolic fxn normal, no valvular issues. S/P BMS to mid LAD 10/13   Neuro/Psych  Headaches, PSYCHIATRIC DISORDERS Anxiety Depression  Neuromuscular disease    GI/Hepatic Neg liver ROS, hiatal hernia, GERD-  Medicated and Controlled,diverticulosis   Endo/Other  negative endocrine ROS  Renal/GU negative Renal ROS  negative genitourinary   Musculoskeletal  (+) Arthritis -, Osteoarthritis,    Abdominal   Peds negative pediatric ROS (+)  Hematology negative hematology ROS (+)   Anesthesia Other Findings   Reproductive/Obstetrics negative OB ROS                         Anesthesia Physical Anesthesia Plan  ASA: III  Anesthesia Plan: MAC   Post-op Pain Management:    Induction: Intravenous  Airway Management Planned:   Additional  Equipment:   Intra-op Plan:   Post-operative Plan:   Informed Consent: I have reviewed the patients History and Physical, chart, labs and discussed the procedure including the risks, benefits and alternatives for the proposed anesthesia with the patient or authorized representative who has indicated his/her understanding and acceptance.   Dental advisory given  Plan Discussed with: CRNA  Anesthesia Plan Comments:         Anesthesia Quick Evaluation

## 2014-02-25 ENCOUNTER — Ambulatory Visit (HOSPITAL_COMMUNITY)
Admission: RE | Admit: 2014-02-25 | Discharge: 2014-02-25 | Disposition: A | Discharge status: Home / Self Care | Payer: Medicare Other | Source: Ambulatory Visit | Attending: Gastroenterology | Admitting: Gastroenterology

## 2014-02-25 ENCOUNTER — Encounter (HOSPITAL_COMMUNITY)
Admission: RE | Disposition: A | Discharge status: Home / Self Care | Payer: Self-pay | Source: Ambulatory Visit | Attending: Gastroenterology

## 2014-02-25 ENCOUNTER — Encounter (HOSPITAL_COMMUNITY): Payer: Medicare Other | Admitting: Anesthesiology

## 2014-02-25 ENCOUNTER — Encounter (HOSPITAL_COMMUNITY): Payer: Self-pay | Admitting: *Deleted

## 2014-02-25 ENCOUNTER — Telehealth: Payer: Self-pay | Admitting: Adult Health

## 2014-02-25 ENCOUNTER — Ambulatory Visit (HOSPITAL_COMMUNITY): Payer: Medicare Other | Admitting: Anesthesiology

## 2014-02-25 DIAGNOSIS — K5730 Diverticulosis of large intestine without perforation or abscess without bleeding: Secondary | ICD-10-CM | POA: Insufficient documentation

## 2014-02-25 DIAGNOSIS — R1310 Dysphagia, unspecified: Secondary | ICD-10-CM | POA: Diagnosis not present

## 2014-02-25 DIAGNOSIS — D126 Benign neoplasm of colon, unspecified: Secondary | ICD-10-CM

## 2014-02-25 DIAGNOSIS — K644 Residual hemorrhoidal skin tags: Secondary | ICD-10-CM

## 2014-02-25 DIAGNOSIS — K5900 Constipation, unspecified: Secondary | ICD-10-CM

## 2014-02-25 DIAGNOSIS — K449 Diaphragmatic hernia without obstruction or gangrene: Secondary | ICD-10-CM | POA: Insufficient documentation

## 2014-02-25 DIAGNOSIS — Z1211 Encounter for screening for malignant neoplasm of colon: Secondary | ICD-10-CM

## 2014-02-25 DIAGNOSIS — Z86711 Personal history of pulmonary embolism: Secondary | ICD-10-CM

## 2014-02-25 DIAGNOSIS — K648 Other hemorrhoids: Secondary | ICD-10-CM

## 2014-02-25 DIAGNOSIS — J449 Chronic obstructive pulmonary disease, unspecified: Secondary | ICD-10-CM

## 2014-02-25 HISTORY — PX: COLONOSCOPY WITH PROPOFOL: SHX5780

## 2014-02-25 HISTORY — PX: BALLOON DILATION: SHX5330

## 2014-02-25 HISTORY — PX: ESOPHAGOGASTRODUODENOSCOPY (EGD) WITH PROPOFOL: SHX5813

## 2014-02-25 SURGERY — COLONOSCOPY WITH PROPOFOL
Anesthesia: Monitor Anesthesia Care

## 2014-02-25 MED ORDER — ACETAMINOPHEN 160 MG/5ML PO SOLN: 325.0000 mg | ORAL | Status: DC | PRN

## 2014-02-25 MED ORDER — PROPOFOL INFUSION 10 MG/ML OPTIME
INTRAVENOUS | Status: DC | PRN
  Administered 2014-02-25: 300 ug/kg/min via INTRAVENOUS

## 2014-02-25 MED ORDER — PROPOFOL 10 MG/ML IV BOLUS
INTRAVENOUS | Status: AC
  Filled 2014-02-25: qty 20

## 2014-02-25 MED ORDER — SODIUM CHLORIDE 0.9 % IV SOLN: INTRAVENOUS | Status: DC

## 2014-02-25 MED ORDER — ACETAMINOPHEN 325 MG PO TABS
325.0000 mg | ORAL_TABLET | ORAL | Status: DC | PRN
  Filled 2014-02-25: qty 2

## 2014-02-25 MED ORDER — ONDANSETRON HCL 4 MG/2ML IJ SOLN
INTRAMUSCULAR | Status: DC | PRN
  Administered 2014-02-25: 4 mg via INTRAVENOUS

## 2014-02-25 MED ORDER — FENTANYL CITRATE 0.05 MG/ML IJ SOLN
INTRAMUSCULAR | Status: AC
  Filled 2014-02-25: qty 2

## 2014-02-25 MED ORDER — LIDOCAINE HCL (CARDIAC) 20 MG/ML IV SOLN
INTRAVENOUS | Status: DC | PRN
  Administered 2014-02-25: 50 mg via INTRAVENOUS

## 2014-02-25 MED ORDER — PROMETHAZINE HCL 25 MG/ML IJ SOLN: 6.2500 mg | INTRAMUSCULAR | Status: DC | PRN

## 2014-02-25 MED ORDER — MIDAZOLAM HCL 2 MG/2ML IJ SOLN
INTRAMUSCULAR | Status: AC
  Filled 2014-02-25: qty 2

## 2014-02-25 MED ORDER — LACTATED RINGERS IV SOLN
INTRAVENOUS | Status: DC | PRN
  Administered 2014-02-25: 08:00:00 via INTRAVENOUS

## 2014-02-25 MED ORDER — LIDOCAINE HCL (CARDIAC) 20 MG/ML IV SOLN
INTRAVENOUS | Status: AC
  Filled 2014-02-25: qty 5

## 2014-02-25 MED ORDER — GLYCOPYRROLATE 0.2 MG/ML IJ SOLN
INTRAMUSCULAR | Status: AC
  Filled 2014-02-25: qty 1

## 2014-02-25 MED ORDER — PRASUGREL HCL 5 MG PO TABS: 5.0000 mg | ORAL_TABLET | Freq: Every morning | ORAL | Status: DC

## 2014-02-25 MED ORDER — LACTATED RINGERS IV SOLN
INTRAVENOUS | Status: DC
  Administered 2014-02-25: 1000 mL via INTRAVENOUS

## 2014-02-25 MED ORDER — MEPERIDINE HCL 100 MG/ML IJ SOLN: 6.2500 mg | INTRAMUSCULAR | Status: DC | PRN

## 2014-02-25 MED ORDER — FENTANYL CITRATE 0.05 MG/ML IJ SOLN
INTRAMUSCULAR | Status: DC | PRN
  Administered 2014-02-25: 50 ug via INTRAVENOUS

## 2014-02-25 MED ORDER — MIDAZOLAM HCL 5 MG/5ML IJ SOLN
INTRAMUSCULAR | Status: DC | PRN
  Administered 2014-02-25: 1 mg via INTRAVENOUS

## 2014-02-25 MED ORDER — PROPOFOL 10 MG/ML IV BOLUS: INTRAVENOUS | Status: DC | PRN

## 2014-02-25 MED ORDER — ONDANSETRON HCL 4 MG/2ML IJ SOLN
INTRAMUSCULAR | Status: AC
Start: 2014-02-25 — End: 2014-02-25
  Filled 2014-02-25: qty 2

## 2014-02-25 SURGICAL SUPPLY — 25 items

## 2014-02-25 NOTE — H&P (View-Only) (Signed)
      History of Present Illness:  Carrie [name] has returned for followup of dysphagia and for evaluation of rectal pain.  She continues to have dysphagia to solids.  She has a history of an esophageal stricture which was dilated years ago.  A modified barium swallow demonstrated a prominent cricopharyngeus.  She's also complaining of severe frequent rectal pain.  She has a protrusion of hemorrhoids with scant bleeding but marked rectal discomfort.  She also leaks stool when she coughs or sneezes.  She tends to be constipated and frequently takes laxatives.  She's not had a colonoscopy to her knowledge.  She's tried various topical ointments and suppositories for hemorrhoids.  Patient has oxygen-dependent COPD.  She takes effient for history of pulmonary emboli.    Review of Systems: Pertinent positive and negative review of systems were noted in the above HPI section. All other review of systems were otherwise negative.    Current Medications, Allergies, Past Medical History, Past Surgical History, Family History and Social History were reviewed in [place] record  Vital signs were reviewed in today's medical record. Physical Exam: General: Well developed , well nourished, no acute distress Skin: anicteric Head: Normocephalic and atraumatic Eyes:  sclerae anicteric, EOMI Ears: Normal auditory acuity Mouth: No deformity or lesions Lungs: Clear throughout to auscultation Heart: Regular rate and rhythm; no murmurs, rubs or bruits Abdomen: Soft, non tender and non distended. No masses, hepatosplenomegaly or hernias noted. Normal Bowel sounds Rectal: She has external hemorrhoids Musculoskeletal: Symmetrical with no gross deformities  Pulses:  Normal pulses noted Extremities: No clubbing, cyanosis, edema or deformities noted Neurological: Alert oriented x 4, grossly nonfocal Psychological:  Alert and cooperative. Normal mood and affect  See Assessment and Plan under  Problem List

## 2014-02-25 NOTE — Interval H&P Note (Signed)
History and Physical Interval Note:  02/25/2014 8:35 AM  Chrissie Noa  has presented today for surgery, with the diagnosis of Hemorrhoids, unspecified hemorrhoid type [455.6] Dysphagia [787.20]  The various methods of treatment have been discussed with the patient and family. After consideration of risks, benefits and other options for treatment, the patient has consented to  Procedure(s): COLONOSCOPY WITH PROPOFOL (N/A) HEMORRHOID BANDING (N/A) ESOPHAGOGASTRODUODENOSCOPY (EGD) WITH PROPOFOL (N/A) BALLOON DILATION (N/A) as a surgical intervention .  The patient's history has been reviewed, patient examined, no change in status, stable for surgery.  I have reviewed the patient's chart and labs.  Questions were answered to the patient's satisfaction.     The recent H&P (dated *02/03/14**) was reviewed, the patient was examined and there is no change in the patients condition since that H&P was completed.   Erskine Emery  02/25/2014, 8:35 AM   Erskine Emery

## 2014-02-25 NOTE — Op Note (Signed)
[facility] [place], [address]   ENDOSCOPY PROCEDURE REPORT  PATIENT: Carrie [name], Carrie [name]  MR#: [id number] BIRTHDATE: 1940-12-04 , 73  yrs. old GENDER: Female ENDOSCOPIST: Inda Castle, MD REFERRED BY: PROCEDURE DATE:  02/25/2014 PROCEDURE:  EGD plus balloon dilation ASA CLASS:     Class III INDICATIONS:  Dysphagia. MEDICATIONS: MAC sedation, administered by CRNA TOPICAL ANESTHETIC:  DESCRIPTION OF PROCEDURE: After the risks benefits and alternatives of the procedure were thoroughly explained, informed consent was obtained.  The Blanco [id number] endoscope was introduced through the mouth and advanced to the third portion of the duodenum. Without limitations.  The instrument was slowly withdrawn as the mucosa was fully examined.        STOMACH: A 2 cm hiatal hernia was noted.  The remainder of the upper endoscopy exam was otherwise normal. Retroflexed views revealed no abnormalities.  Because of complaints of dysphagia a through-the-scope balloon dilator was passed across the distal esophagus and GE junction and inflated to 16.5 and 18 mm for 30 seconds each.  There was minimal resistance and no heme. The scope was then withdrawn from the patient and the procedure completed.  COMPLICATIONS: There were no complications. ENDOSCOPIC IMPRESSION: 1.  hiatal hernia 2.  dysphagia without obvious stricture-status post balloon dilation  RECOMMENDATIONS: 1.  Resume effient in 5 days 2.  OV 4-6 weeks  REPEAT EXAM:  eSigned:  Inda Castle, MD 02/25/2014 9:30 AM   LD:[id number] Laurann Montana, MD

## 2014-02-25 NOTE — Transfer of Care (Signed)
Immediate Anesthesia Transfer of Care Note  Patient: Carrie [name]  Procedure(s) Performed: Procedure(s): COLONOSCOPY WITH PROPOFOL (N/A) ESOPHAGOGASTRODUODENOSCOPY (EGD) WITH PROPOFOL (N/A) BALLOON DILATION (N/A)  Patient Location: PACU  Anesthesia Type:MAC  Level of Consciousness: awake, alert , oriented and patient cooperative  Airway & Oxygen Therapy: Patient Spontanous Breathing and Patient connected to face mask oxygen  Post-op Assessment: Report given to PACU RN, Post -op Vital signs reviewed and stable and Patient moving all extremities X 4  Post vital signs: stable  Complications: No apparent anesthesia complications

## 2014-02-25 NOTE — Op Note (Addendum)
[facility] [place], [address]   COLONOSCOPY PROCEDURE REPORT  PATIENT: Carrie, [name]  MR#: [id number] BIRTHDATE: [birth year] , 73  yrs. old GENDER: Female ENDOSCOPIST: Inda Castle, MD REFERRED BY: PROCEDURE DATE:  02/25/2014 PROCEDURE:   Colonoscopy with snare polypectomy and Colonoscopy with cold biopsy polypectomy ASA CLASS:   Class III INDICATIONS:Average risk patient for colon cancer.   rectal pain MEDICATIONS: MAC sedation, administered by CRNA  DESCRIPTION OF PROCEDURE:   After the risks benefits and alternatives of the procedure were thoroughly explained, informed consent was obtained.  A digital rectal exam revealed external hemorrhoids.   The     endoscope was introduced through the anus and advanced to the cecum, which was identified by both the appendix and ileocecal valve. No adverse events experienced.   The quality of the prep was excellent using Suprep  The instrument was then slowly withdrawn as the colon was fully examined.      COLON FINDINGS: Two sessile polyps measuring 3 mm in size were found at the cecum.  A polypectomy was performed with a cold snare.  The resection was complete and the polyp tissue was completely retrieved.   A sessile polyp measuring 5 mm in size was found in the ascending colon.  A polypectomy was performed with a cold snare.  The resection was complete and the polyp tissue was completely retrieved.   Two sessile polyps measuring 1-2 mm in size were found in the ascending colon.  A polypectomy was performed with cold forceps.   Mild diverticulosis was noted in the sigmoid colon and descending colon.  Retroflexed views revealed no abnormalities. The time to cecum=  .  Withdrawal time=14 minutes 0 seconds.  The scope was withdrawn and the procedure completed. COMPLICATIONS: There were no complications.  ENDOSCOPIC IMPRESSION: 1.   Two sessile polyps measuring 3 mm in size were found at  the cecum; polypectomy was performed with a cold snare 2.   Sessile polyp measuring 5 mm in size was found in the ascending colon; polypectomy was performed with a cold snare 3.   Two sessile polyps measuring 1-2 mm in size were found in the ascending colon; polypectomy was performed with cold forceps 4.   Mild diverticulosis was noted in the sigmoid colon and descending colon  RECOMMENDATIONS: 1.  If the polyp(s) removed today are proven to be adenomatous (pre-cancerous) polyps, you will need a repeat colonoscopy in 3 years.  Otherwise you should continue to follow colorectal cancer screening guidelines for "routine risk" patients with colonoscopy in 10 years.  You will receive a letter within 1-2 weeks with the results of your biopsy as well as final recommendations.  Please call my office if you have not received a letter after 3 weeks. 2.  resume effient in 5 days 3.  surgical consultation for treatment of external hemorrhoids   eSigned:  Inda Castle, MD 02/25/2014 9:43 AM Revised: 02/25/2014 9:43 AM  cc: Kelton Pillar, MD   PATIENT NAME:  Carrie, [name] MR#: [id number]

## 2014-02-25 NOTE — Telephone Encounter (Signed)
I called the patient. She had a EGD and colonoscopy today. I reviewed with MRI results with her Daughter. She states that ever since her mom started using the oxygen during the day her dizzy spells have decreased. She has requested that I send my last office note to her PCP. She also states that they noticed last night that her left pupil was smaller than the right. The patient also complained of some vision changes in that eye. The patient states that she has noticed this before.They plan to follow-up with her eye doctor. I have consulted with Dr. Jannifer Franklin and this is something we will follow. If her vision gets worse or she develops new symptoms then she should let us know.

## 2014-02-25 NOTE — Anesthesia Postprocedure Evaluation (Signed)
   Anesthesia Post-op Note  Patient: Carrie [name]  Procedure(s) Performed: Procedure(s) (LRB): COLONOSCOPY WITH PROPOFOL (N/A) ESOPHAGOGASTRODUODENOSCOPY (EGD) WITH PROPOFOL (N/A) BALLOON DILATION (N/A)  Patient Location: PACU  Anesthesia Type: MAC  Level of Consciousness: awake and alert   Airway and Oxygen Therapy: Patient Spontanous Breathing  Post-op Pain: mild  Post-op Assessment: Post-op Vital signs reviewed, Patient's Cardiovascular Status Stable, Respiratory Function Stable, Patent Airway and No signs of Nausea or vomiting  Last Vitals:  Filed Vitals:   02/25/14 0920  BP:   Temp: 37.1 C  Resp:     Post-op Vital Signs: stable   Complications: No apparent anesthesia complications

## 2014-02-25 NOTE — Discharge Instructions (Signed)
Resume effient in 5 daysMonitored Anesthesia Care Monitored anesthesia care is an anesthesia service for a medical procedure. Anesthesia is the loss of the ability to feel pain. It is produced by medicines called anesthetics. It may affect a small area of your body (local anesthesia), a large area of your body (regional anesthesia), or your entire body (general anesthesia). The need for monitored anesthesia care depends your procedure, your condition, and the potential need for regional or general anesthesia. It is often provided during procedures where:   General anesthesia may be needed if there are complications. This is because you need special care when you are under general anesthesia.   You will be under local or regional anesthesia. This is so that you are able to have higher levels of anesthesia if needed.   You will receive calming medicines (sedatives). This is especially the case if sedatives are given to put you in a semi-conscious state of relaxation (deep sedation). This is because the amount of sedative needed to produce this state can be hard to predict. Too much of a sedative can produce general anesthesia. Monitored anesthesia care is performed by one or more health care providers who have special training in all types of anesthesia. You will need to meet with these health care providers before your procedure. During this meeting, they will ask you about your medical history. They will also give you instructions to follow. (For example, you will need to stop eating and drinking before your procedure. You may also need to stop or change medicines you are taking.) During your procedure, your health care providers will stay with you. They will:   Watch your condition. This includes watching your blood pressure, breathing, and level of pain.   Diagnose and treat problems that occur.   Give medicines if they are needed. These may include calming medicines (sedatives) and anesthetics.    Make sure you are comfortable.  Having monitored anesthesia care does not necessarily mean that you will be under anesthesia. It does mean that your health care providers will be able to manage anesthesia if you need it or if it occurs. It also means that you will be able to have a different type of anesthesia than you are having if you need it. When your procedure is complete, your health care providers will continue to watch your condition. They will make sure any medicines wear off before you are allowed to go home.  Document Released: 03/02/2005 Document Revised: 10/21/2013 Document Reviewed: 07/18/2012 [facility] Patient Information 2015 [place], [place]. This information is not intended to replace advice given to you by your health care provider. Make sure you discuss any questions you have with your health care provider.

## 2014-02-26 ENCOUNTER — Encounter: Payer: Self-pay | Admitting: Gastroenterology

## 2014-02-26 ENCOUNTER — Ambulatory Visit: Payer: Medicare Other | Admitting: Cardiovascular Disease

## 2014-02-26 ENCOUNTER — Encounter (HOSPITAL_COMMUNITY): Payer: Self-pay | Admitting: Gastroenterology

## 2014-02-28 ENCOUNTER — Telehealth: Payer: Self-pay | Admitting: *Deleted

## 2014-02-28 NOTE — Telephone Encounter (Signed)
All records faxed

## 2014-02-28 NOTE — Telephone Encounter (Signed)
Message copied by Oda Kilts on Fri Feb 28, 2014 10:48 AM ------      Message from: Carrie [name]      Created: Tue Feb 25, 2014  9:34 AM       Patient needs followup office visit 4-6 weeks.      Please schedule referral to Dr. Remo Lipps gross for consideration treatment of  external hemorrhoid ------

## 2014-02-28 NOTE — Telephone Encounter (Signed)
Called [facility] to refer patient faxed information today                  [phone]

## 2014-03-03 NOTE — Telephone Encounter (Signed)
Spoke with Sonya at [employer]. She will call me when the appointment is scheduled.

## 2014-03-05 NOTE — Telephone Encounter (Signed)
Patient is scheduled for an appointment with Dr Johney [place] 03/27/14.They have contacted the patient.

## 2014-03-20 NOTE — Telephone Encounter (Signed)
Left message at patient's home requesting she call back to me and I will schedule her follow up appointment.

## 2014-03-21 NOTE — Telephone Encounter (Signed)
I have left message for the patient to call back 

## 2014-03-26 ENCOUNTER — Other Ambulatory Visit (INDEPENDENT_AMBULATORY_CARE_PROVIDER_SITE_OTHER): Payer: Self-pay | Admitting: Surgery

## 2014-03-26 NOTE — Progress Notes (Signed)
Patient Care Team: Delphina Cahill, MD as PCP - General (Internal Medicine) Thayer Headings, MD as Consulting Physician (Cardiology) Inda Castle, MD as Consulting Physician (Gastroenterology)  Cleophas Dunker. Dredge 03/26/2014 8:34 AM Location: [facility] Patient #: [id number] DOB: [birth year] Widowed / Language: Cleophus Molt / Race: White Female  History of Present Illness Adin Hector MD; 03/26/2014 9:04 AM) Patient words: eval hems.  The patient is a 73 year old female who presents with hemorrhoids. Pleasant woman struggling with hemorrhoids since she gave birth to her daughter in the 24s. Has hard stools. Trace to stool softeners. Intermittent flares. Worsened over the past 2 years after doing a lot of heavy lifting to take care of her husband who passed away. Feels burning and irritation. Symptoms include anal pain, anal itching, anal lump, rectal bleeding, pain with defecation and constipation. The patient describes the pain as sharp and burning. Onset was 50 year(s) ago. Recalls incision and drainage of thrombosed hemorrhoid decades ago. Underwent colonoscopy. Mild polyps removed. Hemorrhoids were stripped externally. Felt not to be a candidate for banding. Therefore surgical consultation requested.   Other Problems Adin Hector, MD; 03/26/2014 9:20 AM) Anxiety Disorder Arthritis Asthma Chest pain Chronic Obstructive Lung Disease Depression Diverticulosis Gastroesophageal Reflux Disease Hemorrhoids Home Oxygen Use Hypercholesterolemia Pulmonary Embolism / Blood Clot in Legs ANTICOAGULATED (V58.61  Z79.01)  Past Surgical History Marjean Donna, [place]; 03/26/2014 8:34 AM) Breast Biopsy multiple Colon Polyp Removal - Colonoscopy Hip Surgery Right. Shoulder Surgery Left.  Diagnostic Studies History Marjean Donna, CMA; 03/26/2014 8:34 AM) Colonoscopy within last year Mammogram 1-3 years ago Pap Smear >5 years ago  Allergies Marjean Donna, CMA;  03/26/2014 8:44 AM) Aspirin *ANALGESICS - NonNarcotic* Metoclopramide HCl *GASTROINTESTINAL AGENTS - MISC.* Morphine Sulfate (Concentrate) *ANALGESICS - OPIOID* Tape 1"X5yd *MEDICAL DEVICES* Codeine Phosphate *ANALGESICS - OPIOID* Sulfa Antibiotics  Medication History (Sonya Bynum, CMA; 03/26/2014 8:49 AM) Tylenol Extra Strength (500MG  Tablet, Oral daily) Active. Albuterol Sulfate (Inhalation daily) Active. Astelin (137MCG/SPRAY Solution, Nasal as needed) Active. Esomeprazole Magnesium (20MG  Capsule DR, Oral daily) Active. Zetia (10MG  Tablet, Oral daily) Active. Allegra Allergy (180MG  Tablet, Oral daily) Active. Flonase Allergy Relief (50MCG/ACT Suspension, Nasal as needed) Active. Lasix (40MG  Tablet, Oral daily) Active. Ativan (0.5MG  Tablet, Oral as needed) Active. Singulair (10MG  Tablet, Oral daily) Active. Multivitamins (Oral daily) Active. Nitrostat (0.4MG  Tab Sublingual, Sublingual as needed) Active. Protonix (40MG  Packet, Oral daily) Active. Klor-Con M20 [facility] Tablet ER, Oral daily) Active. TraZODone HCl (50MG  Tablet, Oral daily) Active.  Social History ([place]; 03/26/2014 8:34 AM) Caffeine use Carbonated beverages, Coffee. No alcohol use No drug use Tobacco use Never smoker.  Family History Marjean Donna, [place]; 03/26/2014 8:34 AM) Alcohol Abuse Father. Anesthetic complications Mother. Arthritis Mother. Breast Cancer Mother. Colon Polyps Sister. Diabetes Mellitus Daughter, Mother. Heart Disease Brother, Father, Mother, Sister, Son. Heart disease in female family member before age 71 Heart disease in female family member before age 75 Hypertension Brother, Daughter. Seizure disorder Daughter, Son. Thyroid problems Daughter.  Pregnancy / Birth History Marjean Donna, CMA; 03/26/2014 8:34 AM) Age of menopause 18-50 Gravida 2 Maternal age 46-25 Para 2  Review of Systems Adin Hector MD; 03/26/2014 9:02 AM) General  Present- Fatigue. Not Present- Appetite Loss, Chills, Fever, Night Sweats, Weight Gain and Weight Loss. Skin Not Present- Change in Wart/Mole, Dryness, Hives, Jaundice, New Lesions, Non-Healing Wounds, Rash and Ulcer. HEENT Present- Nose Bleed, Seasonal Allergies, Sinus Pain and Wears glasses/contact lenses. Not Present- Earache, Hearing Loss, Hoarseness, Oral Ulcers, Ringing in  the Ears, Sore Throat, Visual Disturbances and Yellow Eyes. Respiratory Present- Chronic Cough, Difficulty Breathing and Snoring. Not Present- Bloody sputum and Wheezing. Breast Not Present- Breast Mass, Breast Pain, Nipple Discharge and Skin Changes. Cardiovascular Present- Leg Cramps, Shortness of Breath and Swelling of Extremities. Not Present- Chest Pain, Difficulty Breathing Lying Down, Palpitations and Rapid Heart Rate. Gastrointestinal Present- Bloating, Constipation, Difficulty Swallowing, Excessive gas, Hemorrhoids, Indigestion, Pain with Bowel Movement and Rectal Pain. Not Present- Abdominal Pain, Bloody Stool, Change in Bowel Habits, Chronic diarrhea, Gets full quickly at meals, Nausea and Vomiting. Female Genitourinary Not Present- Frequency, Nocturia, Painful Urination, Pelvic Pain and Urgency. Neurological Present- Decreased Memory, Headaches, Trouble walking and Weakness. Not Present- Fainting, Numbness, Seizures, Tingling and Tremor. Psychiatric Present- Anxiety and Depression. Not Present- Bipolar, Change in Sleep Pattern, Fearful and Frequent crying. Endocrine Not Present- Cold Intolerance, Excessive Hunger, Hair Changes, Heat Intolerance, Hot flashes and New Diabetes. Hematology Present- Easy Bruising. Not Present- Excessive bleeding, Gland problems, HIV and Persistent Infections.   Vitals (Sonya Bynum CMA; 03/26/2014 8:37 AM) 03/26/2014 8:36 AM Weight: 181 lb Temp.: 97.51F(Temporal)  Pulse: 78 (Regular)  BP: 126/78 (Sitting, Left Arm, Standard)    Physical Exam Adin Hector MD; 03/26/2014  9:00 AM) General Mental Status-Alert. General Appearance-Not in acute distress, Not Sickly. Orientation-Oriented X3. Hydration-Well hydrated. Voice-Normal.  Integumentary Global Assessment Upon inspection and palpation of skin surfaces of the - Axillae: non-tender, no inflammation or ulceration, no drainage. and Distribution of scalp and body hair is normal. General Characteristics Temperature - normal warmth is noted.  Head and Neck Head-normocephalic, atraumatic with no lesions or palpable masses. Face Global Assessment - atraumatic, no absence of expression. Neck Global Assessment - no abnormal movements, no bruit auscultated on the right, no bruit auscultated on the left, no decreased range of motion, non-tender. Trachea-midline. Thyroid Gland Characteristics - non-tender.  Eye Eyeball - Left-Extraocular movements intact, No Nystagmus. Eyeball - Right-Extraocular movements intact, No Nystagmus. Cornea - Left-No Hazy. Cornea - Right-No Hazy. Sclera/Conjunctiva - Left-No scleral icterus, No Discharge. Sclera/Conjunctiva - Right-No scleral icterus, No Discharge. Pupil - Left-Direct reaction to light normal. Pupil - Right-Direct reaction to light normal.  ENMT Ears Pinna - Left - no drainage observed, no generalized tenderness observed. Right - no drainage observed, no generalized tenderness observed. Nose and Sinuses External Inspection of the Nose - no destructive lesion observed. Inspection of the nares - Left - quiet respiration. Right - quiet respiration. Mouth and Throat Lips - Upper Lip - no fissures observed, no pallor noted. Lower Lip - no fissures observed, no pallor noted. Nasopharynx - no discharge present. Oral Cavity/Oropharynx - Tongue - no dryness observed. Oral Mucosa - no cyanosis observed. Hypopharynx - no evidence of airway distress observed.  Chest and Lung Exam Inspection Movements - Normal and Symmetrical. Accessory  muscles - No use of accessory muscles in breathing. Palpation Palpation of the chest reveals - Non-tender. Auscultation Breath sounds - Normal and Clear.  Cardiovascular Auscultation Rhythm - Regular. Murmurs & Other Heart Sounds - Auscultation of the heart reveals - No Murmurs and No Systolic Clicks.  Abdomen Inspection Inspection of the abdomen reveals - No Visible peristalsis and No Abnormal pulsations. Umbilicus - No Bleeding, No Urine drainage. Palpation/Percussion Palpation and Percussion of the abdomen reveal - Soft, Non Tender, No Rebound tenderness, No Rigidity (guarding) and No Cutaneous hyperesthesia.  Female Genitourinary Sexual Maturity Tanner 5 - Adult hair pattern. Note: No vaginal bleeding nor discharge   Rectal Anorectal Exam External - generalized  tenderness and external hemorrhoids, no anorectal fistula, no pilonidal cysts, no pilonidal sinuses. Internal - generalized tenderness, internal hemorrhoids, no anal strictures. Note: Exam done with assistance of female Medical Assistant in the room. Perianal skin clean with good hygiene. No pruritis. No pilonidal disease. No active fissure but scarring c/w old fissure. No abscess/fistula. Tolerates digital and anoscopic rectal exam but very sensitive. No rectal masses. Hemorrhoidal piles enlarged  External hemorrhoids R anterior large > R post = L lat. Very sensitive Normal sphincter tone.   Peripheral Vascular Upper Extremity Inspection - Left - No Cyanotic nailbeds, Not Ischemic. Right - No Cyanotic nailbeds, Not Ischemic.  Neurologic Neurologic evaluation reveals -normal attention span and ability to concentrate, able to name objects and repeat phrases. Appropriate fund of knowledge , normal sensation and normal coordination. Mental Status Affect - not angry, not paranoid. Cranial Nerves-Normal Bilaterally. Gait-Normal.  Neuropsychiatric Mental status exam performed with findings of-able to  articulate well with normal speech/language, rate, volume and coherence, thought content normal with ability to perform basic computations and apply abstract reasoning and no evidence of hallucinations, delusions, obsessions or homicidal/suicidal ideation.  Musculoskeletal Global Assessment Spine, Ribs and Pelvis - no instability, subluxation or laxity. Right Upper Extremity - no instability, subluxation or laxity.  Lymphatic Head & Neck  General Head & Neck Lymphatics: Bilateral - Description - No Localized lymphadenopathy. Axillary  General Axillary Region: Bilateral - Description - No Localized lymphadenopathy. Femoral & Inguinal  Generalized Femoral & Inguinal Lymphatics: Left - Description - No Localized lymphadenopathy. Right - Description - No Localized lymphadenopathy.    Assessment & Plan Adin Hector MD; 03/26/2014 9:21 AM) INFLAMED EXTERNAL HEMORRHOID (455.3  K64.8) Current Plans  Pt Education - [facility] Hemorrhoids (Kelon Easom) Pt Education - [facility] Rectal Surgery HCI Schedule for Surgery:  She has too much hemorrhoidal disease to manage with office banding only. Worst symptoms are external. I think she would benefit from [facility] internal ligation/pexy and external hemorrhoidectomy of remaining tissues. She and her daughter agree:  The anatomy & physiology of the anorectal region was discussed. The pathophysiology of hemorrhoids and differential diagnosis was discussed. Natural history risks without surgery was discussed. I stressed the importance of a bowel regimen to have daily soft bowel movements to minimize progression of disease. Interventions such as sclerotherapy & banding were discussed.  The patient's symptoms are not adequately controlled by medicines and other non-operative treatments. I feel the risks & problems of no surgery outweigh the operative risks; therefore, I recommended surgery to treat the hemorrhoids by ligation, pexy, and possible resection.  Risks such as  bleeding, infection, urinary difficulties, need for further treatment, heart attack, death, and other risks were discussed. I noted a good likelihood this will help address the problem. Goals of post-operative recovery were discussed as well. Possibility that this will not correct all symptoms was explained. Post-operative pain, bleeding, constipation, and other problems after surgery were discussed. We will work to minimize complications. Educational handouts further explaining the pathology, treatment options, and bowel regimen were given as well. Questions were answered. The patient expresses understanding & wishes to proceed with surgery. Instructions:  Consider surgery to shrink the and tuck in the internal hemorrhoids & remove the outer hemorrhoids. See the [facility] handout.  Get on fiber supplement every day the rest of your life. Try MiraLAX twice a day to start. Wean off stool softeners and use fiber supplement only. Goal = one soft bowel movement a day.  Because you are on blood thinners, we  would like to get the okay from your cardiologist. Hopefully just a note.  Please see other educational materials call if you have questions. ANOSCOPY, DIAGNOSTIC ([id number]) INTERNAL BLEEDING HEMORRHOIDS (455.2  K64.8)

## 2014-04-04 ENCOUNTER — Other Ambulatory Visit: Payer: Self-pay

## 2014-04-16 ENCOUNTER — Telehealth: Payer: Self-pay | Admitting: Cardiovascular Disease

## 2014-04-16 NOTE — Telephone Encounter (Signed)
New message    Need cardiac clearance & need to stop effient

## 2014-04-17 NOTE — Telephone Encounter (Signed)
Spoke with Carrie [name] at [place] who states cardiac clearance is needed for patient to stop Effient for external hemorrhoids removed by Dr. Johney [place].  Clearance should go to [employer] at [employer]

## 2014-04-17 NOTE — Telephone Encounter (Signed)
Hold Effient for 7 days prior to surgery

## 2014-04-30 ENCOUNTER — Encounter (HOSPITAL_COMMUNITY): Payer: Self-pay | Admitting: *Deleted

## 2014-04-30 MED ORDER — GENTAMICIN SULFATE 40 MG/ML IJ SOLN
330.0000 mg | INTRAVENOUS | Status: AC
  Administered 2014-05-01: 330 mg via INTRAVENOUS
  Filled 2014-04-30: qty 8.25

## 2014-05-01 ENCOUNTER — Ambulatory Visit (HOSPITAL_COMMUNITY)
Admission: RE | Admit: 2014-05-01 | Discharge: 2014-05-01 | Disposition: A | Discharge status: Home / Self Care | Payer: Medicare Other | Source: Ambulatory Visit | Attending: Surgery | Admitting: Surgery

## 2014-05-01 ENCOUNTER — Ambulatory Visit (HOSPITAL_COMMUNITY): Payer: Medicare Other | Admitting: Anesthesiology

## 2014-05-01 ENCOUNTER — Encounter (HOSPITAL_COMMUNITY): Payer: Self-pay | Admitting: *Deleted

## 2014-05-01 ENCOUNTER — Encounter (HOSPITAL_COMMUNITY)
Admission: RE | Disposition: A | Discharge status: Home / Self Care | Payer: Self-pay | Source: Ambulatory Visit | Attending: Surgery

## 2014-05-01 DIAGNOSIS — Z9981 Dependence on supplemental oxygen: Secondary | ICD-10-CM | POA: Diagnosis not present

## 2014-05-01 DIAGNOSIS — K219 Gastro-esophageal reflux disease without esophagitis: Secondary | ICD-10-CM | POA: Insufficient documentation

## 2014-05-01 DIAGNOSIS — Z888 Allergy status to other drugs, medicaments and biological substances status: Secondary | ICD-10-CM | POA: Insufficient documentation

## 2014-05-01 DIAGNOSIS — Z885 Allergy status to narcotic agent status: Secondary | ICD-10-CM | POA: Diagnosis not present

## 2014-05-01 DIAGNOSIS — J45909 Unspecified asthma, uncomplicated: Secondary | ICD-10-CM | POA: Insufficient documentation

## 2014-05-01 DIAGNOSIS — Z882 Allergy status to sulfonamides status: Secondary | ICD-10-CM | POA: Diagnosis not present

## 2014-05-01 DIAGNOSIS — K648 Other hemorrhoids: Secondary | ICD-10-CM | POA: Insufficient documentation

## 2014-05-01 DIAGNOSIS — I2510 Atherosclerotic heart disease of native coronary artery without angina pectoris: Secondary | ICD-10-CM | POA: Diagnosis not present

## 2014-05-01 DIAGNOSIS — E780 Pure hypercholesterolemia: Secondary | ICD-10-CM | POA: Diagnosis not present

## 2014-05-01 DIAGNOSIS — Z886 Allergy status to analgesic agent status: Secondary | ICD-10-CM | POA: Insufficient documentation

## 2014-05-01 DIAGNOSIS — Z7901 Long term (current) use of anticoagulants: Secondary | ICD-10-CM | POA: Insufficient documentation

## 2014-05-01 DIAGNOSIS — F419 Anxiety disorder, unspecified: Secondary | ICD-10-CM | POA: Insufficient documentation

## 2014-05-01 DIAGNOSIS — J449 Chronic obstructive pulmonary disease, unspecified: Secondary | ICD-10-CM | POA: Diagnosis not present

## 2014-05-01 DIAGNOSIS — K644 Residual hemorrhoidal skin tags: Secondary | ICD-10-CM | POA: Insufficient documentation

## 2014-05-01 DIAGNOSIS — F329 Major depressive disorder, single episode, unspecified: Secondary | ICD-10-CM | POA: Diagnosis not present

## 2014-05-01 DIAGNOSIS — Z86718 Personal history of other venous thrombosis and embolism: Secondary | ICD-10-CM | POA: Diagnosis not present

## 2014-05-01 HISTORY — PX: TRANSANAL HEMORRHOIDAL DEARTERIALIZATION: SHX6136

## 2014-05-01 LAB — BASIC METABOLIC PANEL
Anion gap: 11 (ref 5–15)
BUN: 11 mg/dL (ref 6–23)
CHLORIDE: 101 meq/L (ref 96–112)
CO2: 29 mEq/L (ref 19–32)
CREATININE: 0.75 mg/dL (ref 0.50–1.10)
Calcium: 9.5 mg/dL (ref 8.4–10.5)
GFR calc non Af Amer: 83 mL/min — ABNORMAL LOW (ref >90)
Glucose, Bld: 96 mg/dL (ref 70–99)
POTASSIUM: 3.8 meq/L (ref 3.7–5.3)
Sodium: 141 mEq/L (ref 137–147)

## 2014-05-01 LAB — CBC
HCT: 39.3 % (ref 36.0–46.0)
Hemoglobin: 12.8 g/dL (ref 12.0–15.0)
MCH: 29.7 pg (ref 26.0–34.0)
MCHC: 32.6 g/dL (ref 30.0–36.0)
MCV: 91.2 fL (ref 78.0–100.0)
PLATELETS: 258 10*3/uL (ref 150–400)
RBC: 4.31 MIL/uL (ref 3.87–5.11)
RDW: 13.8 % (ref 11.5–15.5)
WBC: 9.3 10*3/uL (ref 4.0–10.5)

## 2014-05-01 SURGERY — TRANSANAL HEMORRHOIDAL DEARTERIALIZATION
Anesthesia: General

## 2014-05-01 MED ORDER — ROCURONIUM BROMIDE 100 MG/10ML IV SOLN
INTRAVENOUS | Status: DC | PRN
  Administered 2014-05-01: 30 mg via INTRAVENOUS

## 2014-05-01 MED ORDER — PROMETHAZINE HCL 25 MG/ML IJ SOLN: 6.2500 mg | INTRAMUSCULAR | Status: DC | PRN

## 2014-05-01 MED ORDER — FENTANYL CITRATE 0.05 MG/ML IJ SOLN
INTRAMUSCULAR | Status: DC | PRN
  Administered 2014-05-01 (×3): 50 ug via INTRAVENOUS

## 2014-05-01 MED ORDER — BUPIVACAINE LIPOSOME 1.3 % IJ SUSP
20.0000 mL | INTRAMUSCULAR | Status: DC
  Filled 2014-05-01: qty 20

## 2014-05-01 MED ORDER — LACTATED RINGERS IV BOLUS (SEPSIS): 1000.0000 mL | Freq: Three times a day (TID) | INTRAVENOUS | Status: DC | PRN

## 2014-05-01 MED ORDER — OXYCODONE HCL 5 MG PO TABS
5.0000 mg | ORAL_TABLET | ORAL | Status: DC | PRN
Start: 2014-05-01 — End: 2014-05-02
  Administered 2014-05-01 (×2): 5 mg via ORAL
  Filled 2014-05-01 (×2): qty 1

## 2014-05-01 MED ORDER — MENTHOL 3 MG MT LOZG: 1.0000 | LOZENGE | OROMUCOSAL | Status: DC | PRN

## 2014-05-01 MED ORDER — BUPIVACAINE LIPOSOME 1.3 % IJ SUSP
INTRAMUSCULAR | Status: DC | PRN
  Administered 2014-05-01: 20 mL

## 2014-05-01 MED ORDER — CLINDAMYCIN PHOSPHATE 900 MG/50ML IV SOLN
900.0000 mg | INTRAVENOUS | Status: AC
  Administered 2014-05-01: 900 mg via INTRAVENOUS

## 2014-05-01 MED ORDER — GLYCOPYRROLATE 0.2 MG/ML IJ SOLN
INTRAMUSCULAR | Status: AC
  Filled 2014-05-01: qty 2

## 2014-05-01 MED ORDER — HYDROMORPHONE HCL 1 MG/ML IJ SOLN
0.2500 mg | INTRAMUSCULAR | Status: DC | PRN
  Administered 2014-05-01 (×4): 0.25 mg via INTRAVENOUS

## 2014-05-01 MED ORDER — OXYCODONE HCL 5 MG PO TABS: 5.0000 mg | ORAL_TABLET | ORAL | Status: DC | PRN

## 2014-05-01 MED ORDER — LACTATED RINGERS IV SOLN
INTRAVENOUS | Status: DC | PRN
  Administered 2014-05-01: 15:00:00 via INTRAVENOUS

## 2014-05-01 MED ORDER — SODIUM CHLORIDE 0.9 % IJ SOLN: 3.0000 mL | Freq: Two times a day (BID) | INTRAMUSCULAR | Status: DC

## 2014-05-01 MED ORDER — LIDOCAINE HCL (CARDIAC) 20 MG/ML IV SOLN
INTRAVENOUS | Status: DC | PRN
  Administered 2014-05-01: 40 mg via INTRAVENOUS

## 2014-05-01 MED ORDER — HYDROCORTISONE 2.5 % RE CREA: 1.0000 "application " | TOPICAL_CREAM | Freq: Two times a day (BID) | RECTAL | Status: AC

## 2014-05-01 MED ORDER — PHENOL 1.4 % MT LIQD: 2.0000 | OROMUCOSAL | Status: DC | PRN

## 2014-05-01 MED ORDER — DIPHENHYDRAMINE HCL 50 MG/ML IJ SOLN
12.5000 mg | Freq: Four times a day (QID) | INTRAMUSCULAR | Status: DC | PRN
  Administered 2014-05-01: 12.5 mg via INTRAVENOUS
  Filled 2014-05-01: qty 1

## 2014-05-01 MED ORDER — ACETAMINOPHEN 500 MG PO TABS
1000.0000 mg | ORAL_TABLET | Freq: Three times a day (TID) | ORAL | Status: DC
  Filled 2014-05-01 (×4): qty 2

## 2014-05-01 MED ORDER — NEOSTIGMINE METHYLSULFATE 10 MG/10ML IV SOLN
INTRAVENOUS | Status: DC | PRN
  Administered 2014-05-01: 2.5 mg via INTRAVENOUS

## 2014-05-01 MED ORDER — ACETAMINOPHEN 10 MG/ML IV SOLN
1000.0000 mg | Freq: Once | INTRAVENOUS | Status: AC
  Administered 2014-05-01: 1000 mg via INTRAVENOUS
  Filled 2014-05-01: qty 100

## 2014-05-01 MED ORDER — PROPOFOL 10 MG/ML IV BOLUS
INTRAVENOUS | Status: DC | PRN
  Administered 2014-05-01: 100 mg via INTRAVENOUS
  Administered 2014-05-01: 30 mg via INTRAVENOUS

## 2014-05-01 MED ORDER — SODIUM CHLORIDE 0.9 % IJ SOLN
INTRAMUSCULAR | Status: DC | PRN
Start: 2014-05-01 — End: 2014-05-01
  Administered 2014-05-01: 10 mL via INTRAVENOUS

## 2014-05-01 MED ORDER — CLINDAMYCIN PHOSPHATE 900 MG/50ML IV SOLN
INTRAVENOUS | Status: AC
  Filled 2014-05-01: qty 50

## 2014-05-01 MED ORDER — SODIUM CHLORIDE 0.9 % IJ SOLN: 3.0000 mL | INTRAMUSCULAR | Status: DC | PRN

## 2014-05-01 MED ORDER — ALUM & MAG HYDROXIDE-SIMETH 200-200-20 MG/5ML PO SUSP
30.0000 mL | Freq: Four times a day (QID) | ORAL | Status: DC | PRN
  Filled 2014-05-01: qty 30

## 2014-05-01 MED ORDER — CHLORHEXIDINE GLUCONATE 4 % EX LIQD: 1.0000 "application " | Freq: Once | CUTANEOUS | Status: DC

## 2014-05-01 MED ORDER — ONDANSETRON 8 MG/NS 50 ML IVPB
8.0000 mg | Freq: Four times a day (QID) | INTRAVENOUS | Status: DC | PRN
  Filled 2014-05-01: qty 8

## 2014-05-01 MED ORDER — PROMETHAZINE HCL 25 MG/ML IJ SOLN
6.2500 mg | INTRAMUSCULAR | Status: DC | PRN
  Administered 2014-05-01: 6.25 mg via INTRAVENOUS
  Filled 2014-05-01: qty 1

## 2014-05-01 MED ORDER — ACETAMINOPHEN 325 MG PO TABS: 325.0000 mg | ORAL_TABLET | Freq: Four times a day (QID) | ORAL | Status: DC | PRN

## 2014-05-01 MED ORDER — NITROGLYCERIN 0.4 MG SL SUBL: 0.4000 mg | SUBLINGUAL_TABLET | SUBLINGUAL | Status: DC | PRN

## 2014-05-01 MED ORDER — ONDANSETRON HCL 4 MG/2ML IJ SOLN
INTRAMUSCULAR | Status: DC | PRN
  Administered 2014-05-01: 4 mg via INTRAVENOUS

## 2014-05-01 MED ORDER — IPRATROPIUM-ALBUTEROL 18-103 MCG/ACT IN AERO: 1.0000 | INHALATION_SPRAY | Freq: Three times a day (TID) | RESPIRATORY_TRACT | Status: DC

## 2014-05-01 MED ORDER — NEOSTIGMINE METHYLSULFATE 10 MG/10ML IV SOLN
INTRAVENOUS | Status: AC
  Filled 2014-05-01: qty 1

## 2014-05-01 MED ORDER — ONDANSETRON HCL 4 MG/2ML IJ SOLN
INTRAMUSCULAR | Status: AC
  Filled 2014-05-01: qty 2

## 2014-05-01 MED ORDER — IPRATROPIUM-ALBUTEROL 0.5-2.5 (3) MG/3ML IN SOLN
3.0000 mL | RESPIRATORY_TRACT | Status: DC | PRN
Start: 2014-05-01 — End: 2014-05-02
  Filled 2014-05-01: qty 3

## 2014-05-01 MED ORDER — ROCURONIUM BROMIDE 100 MG/10ML IV SOLN
INTRAVENOUS | Status: AC
  Filled 2014-05-01: qty 1

## 2014-05-01 MED ORDER — SODIUM CHLORIDE 0.9 % IJ SOLN
INTRAMUSCULAR | Status: AC
  Filled 2014-05-01: qty 10

## 2014-05-01 MED ORDER — PROPOFOL 10 MG/ML IV BOLUS
INTRAVENOUS | Status: AC
  Filled 2014-05-01: qty 20

## 2014-05-01 MED ORDER — FENTANYL CITRATE 0.05 MG/ML IJ SOLN
INTRAMUSCULAR | Status: AC
  Filled 2014-05-01: qty 5

## 2014-05-01 MED ORDER — SODIUM CHLORIDE 0.9 % IV SOLN: 250.0000 mL | INTRAVENOUS | Status: DC | PRN

## 2014-05-01 MED ORDER — CLINDAMYCIN PHOSPHATE 900 MG/50ML IV SOLN: 900.0000 mg | INTRAVENOUS | Status: DC

## 2014-05-01 MED ORDER — HYDROMORPHONE HCL 1 MG/ML IJ SOLN
INTRAMUSCULAR | Status: AC
  Filled 2014-05-01: qty 1

## 2014-05-01 MED ORDER — FENTANYL CITRATE 0.05 MG/ML IJ SOLN: 25.0000 ug | INTRAMUSCULAR | Status: DC | PRN

## 2014-05-01 MED ORDER — HYDROCORTISONE ACETATE 25 MG RE SUPP
25.0000 mg | Freq: Two times a day (BID) | RECTAL | Status: DC | PRN
Start: 2014-05-01 — End: 2014-05-02
  Filled 2014-05-01: qty 1

## 2014-05-01 MED ORDER — ONDANSETRON HCL 4 MG/2ML IJ SOLN: 4.0000 mg | Freq: Four times a day (QID) | INTRAMUSCULAR | Status: DC | PRN

## 2014-05-01 MED ORDER — MAGIC MOUTHWASH
15.0000 mL | Freq: Four times a day (QID) | ORAL | Status: DC | PRN
  Filled 2014-05-01: qty 15

## 2014-05-01 MED ORDER — LACTATED RINGERS IV SOLN
INTRAVENOUS | Status: DC
  Administered 2014-05-01: 1000 mL via INTRAVENOUS

## 2014-05-01 MED ORDER — LIP MEDEX EX OINT: 1.0000 "application " | TOPICAL_OINTMENT | Freq: Two times a day (BID) | CUTANEOUS | Status: DC

## 2014-05-01 MED ORDER — ALBUTEROL SULFATE (2.5 MG/3ML) 0.083% IN NEBU: 2.5000 mg | INHALATION_SOLUTION | Freq: Four times a day (QID) | RESPIRATORY_TRACT | Status: DC | PRN

## 2014-05-01 MED ORDER — GLYCOPYRROLATE 0.2 MG/ML IJ SOLN
INTRAMUSCULAR | Status: DC | PRN
  Administered 2014-05-01: 0.4 mg via INTRAVENOUS

## 2014-05-01 SURGICAL SUPPLY — 28 items
BLADE HEX COATED 2.75 (ELECTRODE) ×3 IMPLANT
BLADE SURG 15 STRL LF DISP TIS (BLADE) IMPLANT
BLADE SURG 15 STRL SS (BLADE) ×3
BRIEF STRETCH FOR OB PAD LRG (UNDERPADS AND DIAPERS) ×3 IMPLANT
DECANTER SPIKE VIAL GLASS SM (MISCELLANEOUS) ×1 IMPLANT
DRAPE LAPAROTOMY T 102X78X121 (DRAPES) ×3 IMPLANT
DRSG PAD ABDOMINAL 8X10 ST (GAUZE/BANDAGES/DRESSINGS) IMPLANT
ELECT REM PT RETURN 9FT ADLT (ELECTROSURGICAL) ×3
ELECTRODE REM PT RTRN 9FT ADLT (ELECTROSURGICAL) ×1 IMPLANT
GAUZE SPONGE 4X4 12PLY STRL (GAUZE/BANDAGES/DRESSINGS) ×2 IMPLANT
GAUZE SPONGE 4X4 16PLY XRAY LF (GAUZE/BANDAGES/DRESSINGS) ×3 IMPLANT
GLOVE BIOGEL PI IND STRL 8 (GLOVE) ×1 IMPLANT
GLOVE BIOGEL PI INDICATOR 8 (GLOVE) ×2
GLOVE ECLIPSE 8.0 STRL XLNG CF (GLOVE) ×3 IMPLANT
GOWN STRL REUS W/TWL XL LVL3 (GOWN DISPOSABLE) ×8 IMPLANT
KIT BASIN OR (CUSTOM PROCEDURE TRAY) ×3 IMPLANT
KIT SLIDE ONE PROLAPS HEMORR (KITS) ×3 IMPLANT
LUBRICANT JELLY K Y 4OZ (MISCELLANEOUS) ×3 IMPLANT
NEEDLE HYPO 22GX1.5 SAFETY (NEEDLE) ×3 IMPLANT
PACK BASIC VI WITH GOWN DISP (CUSTOM PROCEDURE TRAY) ×3 IMPLANT
PAD ABD 8X10 STRL (GAUZE/BANDAGES/DRESSINGS) ×2 IMPLANT
PENCIL BUTTON HOLSTER BLD 10FT (ELECTRODE) ×2 IMPLANT
SUT CHROMIC 3 0 SH 27 (SUTURE) ×2 IMPLANT
SUT VIC AB 2-0 UR6 27 (SUTURE) IMPLANT
SYR 20CC LL (SYRINGE) ×3 IMPLANT
TOWEL OR 17X26 10 PK STRL BLUE (TOWEL DISPOSABLE) ×3 IMPLANT
TOWEL OR NON WOVEN STRL DISP B (DISPOSABLE) ×3 IMPLANT
YANKAUER SUCT BULB TIP 10FT TU (MISCELLANEOUS) ×3 IMPLANT

## 2014-05-01 NOTE — Op Note (Signed)
05/01/2014  4:51 PM  PATIENT:  Carrie [name]  73 y.o. female  Patient Care Team: Delphina Cahill, MD as PCP - General (Internal Medicine) Thayer Headings, MD as Consulting Physician (Cardiology) Inda Castle, MD as Consulting Physician (Gastroenterology)  PRE-OPERATIVE DIAGNOSIS:  Bleeding Hemorrhoids  POST-OPERATIVE DIAGNOSIS:  Bleeding Internal/External Hemorrhoids  PROCEDURE:  Procedure(s): TRANSANAL HEMORRHOIDAL DEARTERIALIZATION (Hemorrhoidal ligation and sutured pexy using [place] system) EXTERNAL HEMORRHOIDECTOMY x 2 EXAM UNDER ANESTHESIA  SURGEON:  Surgeon(s): Michael Boston, MD  ASSISTANT: none   ANESTHESIA:     Anorectal block with liposomal bupivicaine local and general  EBL:     Delay start of Pharmacological VTE agent (>24hrs) due to surgical blood loss or risk of bleeding:  no  DRAINS: none   SPECIMEN:  Source of Specimen:  External hemorrhoids (Left lateral & right anterior)  DISPOSITION OF SPECIMEN:  PATHOLOGY  COUNTS:  YES  PLAN OF CARE: Discharge to home after PACU  PATIENT DISPOSITION:  PACU - hemodynamically stable.  INDICATION: Pt with symptomatic hemorrhoids, especially external piles.  I offered THD ligation & external hemorrhoidectomy  The anatomy & physiology of the anorectal region was discussed.  The pathophysiology of hemorrhoids and differential diagnosis was discussed.  Natural history risks without surgery was discussed.   I stressed the importance of a bowel regimen to have daily soft bowel movements to minimize progression of disease.  Interventions such as sclerotherapy & banding were discussed.  The patient's symptoms are not adequately controlled by medicines and other non-operative treatments.  I feel the risks & problems of no surgery outweigh the operative risks; therefore, I recommended surgery to treat the hemorrhoids by ligation, pexy, and possible resection.  Risks such as bleeding, infection, urinary difficulties, need for  further treatment, heart attack, death, and other risks were discussed.   I noted a good likelihood this will help address the problem.  Goals of post-operative recovery were discussed as well.  Possibility that this will not correct all symptoms was explained.  Post-operative pain, bleeding, constipation, and other problems after surgery were discussed.  We will work to minimize complications.   Educational handouts further explaining the pathology, treatment options, and bowel regimen were given as well.  Questions were answered.  The patient expresses understanding & wishes to proceed with surgery.   OR FINDINGS: Large internal / external hemorrhoids.  Ext L lateral > R anterior >> R anterior.  DESCRIPTION:   Informed consent was confirmed. Patient underwent general anesthesia without difficulty. Patient was placed into prone positioning.  The perianal region was prepped and draped in sterile fashion. Surgical time-out confirmed our plan.  I did digital rectal examination and then transitioned over to anoscopy to get a sense of the anatomy.  I switched over to the [facility] fiberoptically lit Doppler anocope.   Using the Doppler on the tip of the [place] anoscope, I identified the arterial hemorrhoidal vessels coming in in the classic hexagonal anatomical pattern (right posterior/lateral/anterior, left posterior /lateral/anterior).    I proceeded to ligate the hemorrhoidal arteries. I used a 2-0 Vicryl suture on a UR-6 needle in a figure-of-eight fashion over the signal around 6 cm proximal to the anal verge. I then ran that stitch longitudinally more distally to the white line of [place]. I then tied that stitch down to cause a hemorrhoidopexy. I did that for all 6 locations.    I redid Doppler anoscopy. I Identified a signal at the left lateral, left anterior, and right anterior locations.  I  isolated and ligated this with a figure-of-eight stitch. Signals went away.  At completion of this, hemorrhoids were  better reduced into the rectum. There is no more prolapse.   External anatomy still had large Left lateral >> Right anterior piles.  I excised these locations longitudinally starting out in the rectum across anoderm especially the external components, taking care to avoid resecting too much anoderm wall.  I closed the anal canal with 2-0 Vicryl suture in a running fashion and tied down and pexied that region.  I closed the hemorrhoidectomy wounds from the sphincters into the perianal tissues with 3-0 chromic running suture to good result.  Did leave distal 5 mm opening to allow drainage.    At the completion of this there were no more significant external hemorrhoid components.  Sphincter normal.  No stricture.  I repeated anoscopy and examination.  Hemostasis was good.  Patient is being extubated go to recovery room.  I had discussed postop care in detail with the patient in the preop holding area.  Instructions are written.  I am about to discuss the patient's status to the family.     Adin Hector, M.D., F.A.C.S. Gastrointestinal and Minimally Invasive Surgery [facility] [address] [place], [place] [address] [phone] Main / Paging

## 2014-05-01 NOTE — Transfer of Care (Signed)
Immediate Anesthesia Transfer of Care Note  Patient: Carrie [name]  Procedure(s) Performed: Procedure(s): TRANSANAL HEMORRHOIDAL DEARTERIALIZATION LIGATION PEXY.EXTERNAL HEMORRHOIDECTOMY AND EXAM UNDER ANESTHESIA (N/A)  Patient Location: PACU  Anesthesia Type:General  Level of Consciousness: awake, alert  and oriented  Airway & Oxygen Therapy: Patient Spontanous Breathing and Patient connected to face mask oxygen  Post-op Assessment: Report given to PACU RN and Post -op Vital signs reviewed and stable  Post vital signs: Reviewed and stable  Complications: No apparent anesthesia complications

## 2014-05-01 NOTE — Discharge Instructions (Signed)
ANORECTAL SURGERY:  POST OPERATIVE INSTRUCTIONS  1. Take your usually prescribed home medications unless otherwise directed. 2. DIET: Follow a light bland diet the first 24 hours after arrival home, such as soup, liquids, crackers, etc.  Be sure to include lots of fluids daily.  Avoid fast food or heavy meals as your are more likely to get nauseated.  Eat a low fat the next few days after surgery.   3. PAIN CONTROL: a. Pain is best controlled by a usual combination of three different methods TOGETHER: i. Ice/Heat ii. Over the counter pain medication iii. Prescription pain medication b. Most patients will experience some swelling and discomfort in the anus/rectal area. and incisions.  Ice packs or heat (30-60 minutes up to 6 times a day) will help. Use ice for the first few days to help decrease swelling and bruising, then switch to heat such as warm towels, sitz baths, warm baths, etc to help relax tight/sore spots and speed recovery.  Some people prefer to use ice alone, heat alone, alternating between ice & heat.  Experiment to what works for you.  Swelling and bruising can take several weeks to resolve.   c. It is helpful to take an over-the-counter pain medication regularly for the first few weeks.  Choose one of the following that works best for you: i. Naproxen (Aleve, etc)  Two 220mg  tabs twice a day ii. Ibuprofen (Advil, etc) Three 200mg  tabs four times a day (every meal & bedtime) iii. Acetaminophen (Tylenol, etc) 500-650mg  four times a day (every meal & bedtime) d. A  prescription for pain medication (such as oxycodone, hydrocodone, etc) should be given to you upon discharge.  Take your pain medication as prescribed.  i. If you are having problems/concerns with the prescription medicine (does not control pain, nausea, vomiting, rash, itching, etc), please call us [phone] to see if we need to switch you to a different pain medicine that will work better for you and/or control your  side effect better. ii. If you need a refill on your pain medication, please contact your pharmacy.  They will contact our office to request authorization. Prescriptions will not be filled after 5 pm or on week-ends.  Use a Sitz Bath 4-8 times a day for relief A sitz bath is a warm water bath taken in the sitting position that covers only the hips and buttocks. It may be used for either healing or hygiene purposes. Sitz baths are also used to relieve pain, itching, or muscle spasms. The water may contain medicine. Moist heat will help you heal and relax.  HOME CARE INSTRUCTIONS  Take 3 to 4 sitz baths a day.  Fill the bathtub half full with warm water.  Sit in the water and open the drain a little.  Turn on the warm water to keep the tub half full. Keep the water running constantly.  Soak in the water for 15 to 20 minutes.  After the sitz bath, pat the affected area dry first. SEEK MEDICAL CARE IF:  You get worse instead of better. Stop the sitz baths if you get worse.   4. KEEP YOUR BOWELS REGULAR a. The goal is one bowel movement a day b. Avoid getting constipated.  Between the surgery and the pain medications, it is common to experience some constipation.  Increasing fluid intake and taking a fiber supplement (such as Metamucil, Citrucel, FiberCon, MiraLax, etc) 1-2 times a day regularly will usually help prevent this problem from occurring.  A  mild laxative (prune juice, Milk of Magnesia, MiraLax, etc) should be taken according to package directions if there are no bowel movements after 48 hours. c. Watch out for diarrhea.  If you have many loose bowel movements, simplify your diet to bland foods & liquids for a few days.  Stop any stool softeners and decrease your fiber supplement.  Switching to mild anti-diarrheal medications (Kayopectate, Pepto Bismol) can help.  If this worsens or does not improve, please call us.  5. Wound Care a. Remove your bandages the day after surgery.   Unless discharge instructions indicate otherwise, leave your bandage dry and in place overnight.  Remove the bandage during your first bowel movement.   b. Allow the wound packing to fall out over the next few days.  You can trim exposed gauze / ribbon as it falls out.  You do not need to repack the wound unless instructed otherwise.  Wear an absorbent pad or soft cotton gauze in your underwear as needed to catch any drainage and help keep the area  c. Keep the area clean and dry.  Bathe / shower every day.  Keep the area clean by showering / bathing over the incision / wound.   It is okay to soak an open wound to help wash it.  Wet wipes or showers / gentle washing after bowel movements is often less traumatic than regular toilet paper. d. Dennis Bast may have some styrofoam-like soft packing in the rectum which will come out with the first bowel movement.  e. You will often notice bleeding with bowel movements.  This should slow down by the end of the first week of surgery f. Expect some drainage.  This should slow down, too, by the end of the first week of surgery.  Wear an absorbent pad or soft cotton gauze in your underwear until the drainage stops. 6. ACTIVITIES as tolerated:   a. You may resume regular (light) daily activities beginning the next day--such as daily self-care, walking, climbing stairs--gradually increasing activities as tolerated.  If you can walk 30 minutes without difficulty, it is safe to try more intense activity such as jogging, treadmill, bicycling, low-impact aerobics, swimming, etc. b. Save the most intensive and strenuous activity for last such as sit-ups, heavy lifting, contact sports, etc  Refrain from any heavy lifting or straining until you are off narcotics for pain control.   c. DO NOT PUSH THROUGH PAIN.  Let pain be your guide: If it hurts to do something, don't do it.  Pain is your body warning you to avoid that activity for another week until the pain goes down. d. You may  drive when you are no longer taking prescription pain medication, you can comfortably sit for long periods of time, and you can safely maneuver your car and apply brakes. e. Dennis Bast may have sexual intercourse when it is comfortable.  7. FOLLOW UP in our office a. Please call [facility] at [phone] to set up an appointment to see your surgeon in the office for a follow-up appointment approximately 2 weeks after your surgery. b. Make sure that you call for this appointment the day you arrive home to insure a convenient appointment time. 10. IF YOU HAVE DISABILITY OR FAMILY LEAVE FORMS, BRING THEM TO THE OFFICE FOR PROCESSING.  DO NOT GIVE THEM TO YOUR DOCTOR.        WHEN TO CALL us [phone]: 1. Poor pain control 2. Reactions / problems with new medications (rash/itching, nausea, etc)  3. Fever over 101.5 F (38.5 C) 4. Inability to urinate 5. Nausea and/or vomiting 6. Worsening swelling or bruising 7. Continued bleeding from incision. 8. Increased pain, redness, or drainage from the incision  The clinic staff is available to answer your questions during regular business hours (8:30am-5pm).  Please dont hesitate to call and ask to speak to one of our nurses for clinical concerns.   A surgeon from [facility] is always on call at the hospitals   If you have a medical emergency, go to the nearest emergency room or call 911.    [facility], [place], [place], [place], [place]  [address] ? MAIN: [phone] ? TOLL FREE: [phone] ? FAX [phone] [email or web address]   GETTING TO GOOD BOWEL HEALTH. Irregular bowel habits such as constipation and diarrhea can lead to many problems over time.  Having one soft bowel movement a day is the most important way to prevent further problems.  The anorectal canal is designed to handle stretching and feces to safely manage our ability to get rid of solid waste (feces, poop, stool) out  of our body.  BUT, hard constipated stools can act like ripping concrete bricks and diarrhea can be a burning fire to this very sensitive area of our body, causing inflamed hemorrhoids, anal fissures, increasing risk is perirectal abscesses, abdominal pain/bloating, an making irritable bowel worse.     The goal: ONE SOFT BOWEL MOVEMENT A DAY!  To have soft, regular bowel movements:   Drink at least 8 tall glasses of water a day.    Take plenty of fiber.  Fiber is the undigested part of plant food that passes into the colon, acting s natures broom to encourage bowel motility and movement.  Fiber can absorb and hold large amounts of water. This results in a larger, bulkier stool, which is soft and easier to pass. Work gradually over several weeks up to 6 servings a day of fiber (25g a day even more if needed) in the form of: o Vegetables -- Root (potatoes, carrots, turnips), leafy green (lettuce, salad greens, celery, spinach), or cooked high residue (cabbage, broccoli, etc) o Fruit -- Fresh (unpeeled skin & pulp), Dried (prunes, apricots, cherries, etc ),  or stewed ( applesauce)  o Whole grain breads, pasta, etc (whole wheat)  o Bran cereals   Bulking Agents -- This type of water-retaining fiber generally is easily obtained each day by one of the following:  o Psyllium bran -- The psyllium plant is remarkable because its ground seeds can retain so much water. This product is available as Metamucil, Konsyl, Effersyllium, Per Diem Fiber, or the less expensive generic preparation in drug and health food stores. Although labeled a laxative, it really is not a laxative.  o Methylcellulose -- This is another fiber derived from wood which also retains water. It is available as Citrucel. o Polyethylene Glycol - and artificial fiber commonly called Miralax or Glycolax.  It is helpful for people with gassy or bloated feelings with regular fiber o Flax Seed - a less gassy fiber than psyllium  No reading or  other relaxing activity while on the toilet. If bowel movements take longer than 5 minutes, you are too constipated  AVOID CONSTIPATION.  High fiber and water intake usually takes care of this.  Sometimes a laxative is needed to stimulate more frequent bowel movements, but   Laxatives are not a good long-term solution as it can wear the colon out.  o Osmotics (Milk of Magnesia, Fleets phosphosoda, Magnesium citrate, MiraLax, GoLytely) are safer than  o Stimulants (Senokot, Castor Oil, Dulcolax, Ex Lax)    o Do not take laxatives for more than 7days in a row.   IF SEVERELY CONSTIPATED, try a Bowel Retraining Program: o Do not use laxatives.  o Eat a diet high in roughage, such as bran cereals and leafy vegetables.  o Drink six (6) ounces of prune or apricot juice each morning.  o Eat two (2) large servings of stewed fruit each day.  o Take one (1) heaping tablespoon of a psyllium-based bulking agent twice a day. Use sugar-free sweetener when possible to avoid excessive calories.  o Eat a normal breakfast.  o Set aside 15 minutes after breakfast to sit on the toilet, but do not strain to have a bowel movement.  o If you do not have a bowel movement by the third day, use an enema and repeat the above steps.   Controlling diarrhea o Switch to liquids and simpler foods for a few days to avoid stressing your intestines further. o Avoid dairy products (especially milk & ice cream) for a short time.  The intestines often can lose the ability to digest lactose when stressed. o Avoid foods that cause gassiness or bloating.  Typical foods include beans and other legumes, cabbage, broccoli, and dairy foods.  Every person has some sensitivity to other foods, so listen to our body and avoid those foods that trigger problems for you. o Adding fiber (Citrucel, Metamucil, psyllium, Miralax) gradually can help thicken stools by absorbing excess fluid and retrain the intestines to act more normally.  Slowly  increase the dose over a few weeks.  Too much fiber too soon can backfire and cause cramping & bloating. o Probiotics (such as active yogurt, Align, etc) may help repopulate the intestines and colon with normal bacteria and calm down a sensitive digestive tract.  Most studies show it to be of mild help, though, and such products can be costly. o Medicines: - Bismuth subsalicylate (ex. Kayopectate, Pepto Bismol) every 30 minutes for up to 6 doses can help control diarrhea.  Avoid if pregnant. - Loperamide (Immodium) can slow down diarrhea.  Start with two tablets (4mg  total) first and then try one tablet every 6 hours.  Avoid if you are having fevers or severe pain.  If you are not better or start feeling worse, stop all medicines and call your doctor for advice o Call your doctor if you are getting worse or not better.  Sometimes further testing (cultures, endoscopy, X-ray studies, bloodwork, etc) may be needed to help diagnose and treat the cause of the diarrhea.  Managing Pain  Pain after surgery or related to activity is often due to strain/injury to muscle, tendon, nerves and/or incisions.  This pain is usually short-term and will improve in a few months.   Many people find it helpful to do the following things TOGETHER to help speed the process of healing and to get back to regular activity more quickly:  1. Avoid heavy physical activity a.  no lifting greater than 20 pounds b. Do not push through the pain.  Listen to your body and avoid positions and maneuvers than reproduce the pain c. Walking is okay as tolerated, but go slowly and stop when getting sore.  d. Remember: If it hurts to do it, then dont do it! 2. Take Anti-inflammatory medication  a. Take with food/snack around the clock for 1-2 weeks  i. This helps the muscle and nerve tissues become less irritable and calm down faster ii. Choose Acetaminophen 500mg  tabs (Tylenol) 1-2 pills with every meal and just before bedtime 3. Use  a Heating pad or Ice/Cold Pack a. 4-6 times a day b. May use warm bath/hottub  or showers 4. Try Gentle Massage and/or Stretching  a. at the area of pain many times a day b. stop if you feel pain - do not overdo it  Try these steps together to help you body heal faster and avoid making things get worse.  Doing just one of these things may not be enough.    If you are not getting better after two weeks or are noticing you are getting worse, contact our office for further advice; we may need to re-evaluate you & see what other things we can do to help.     General Anesthesia, Care After Refer to this sheet in the next few weeks. These instructions provide you with information on caring for yourself after your procedure. Your health care provider may also give you more specific instructions. Your treatment has been planned according to current medical practices, but problems sometimes occur. Call your health care provider if you have any problems or questions after your procedure. WHAT TO EXPECT AFTER THE PROCEDURE After the procedure, it is typical to experience:  Sleepiness.  Nausea and vomiting. HOME CARE INSTRUCTIONS  For the first 24 hours after general anesthesia:  Have a responsible person with you.  Do not drive a car. If you are alone, do not take public transportation.  Do not drink alcohol.  Do not take medicine that has not been prescribed by your health care provider.  Do not sign important papers or make important decisions.  You may resume a normal diet and activities as directed by your health care provider.  Change bandages (dressings) as directed.  If you have questions or problems that seem related to general anesthesia, call the hospital and ask for the anesthetist or anesthesiologist on call. SEEK MEDICAL CARE IF:  You have nausea and vomiting that continue the day after anesthesia.  You develop a rash. SEEK IMMEDIATE MEDICAL CARE IF:   You have  difficulty breathing.  You have chest pain.  You have any allergic problems. Document Released: 09/12/2000 Document Revised: 06/11/2013 Document Reviewed: 12/20/2012 [facility] Patient Information 2015 [place], [place]. This information is not intended to replace advice given to you by your health care provider. Make sure you discuss any questions you have with your health care provider.

## 2014-05-01 NOTE — H&P (Signed)
Carrie [name]. [place]  Location: [facility] Patient #: [id number] DOB: 1940/11/13 Widowed / Language: Carrie [name] / Race: White Female  History of Present Illness Carrie Hector MD; 03/26/2014 9:04 AM) Patient words: eval hems.  The patient is a 73 year old female who presents with hemorrhoids. Pleasant woman struggling with hemorrhoids since she gave birth to her daughter in the 46s. Has hard stools. Trace to stool softeners. Intermittent flares. Worsened over the past 2 years after doing a lot of heavy lifting to take care of her husband who passed away. Feels burning and irritation. Symptoms include anal pain, anal itching, anal lump, rectal bleeding, pain with defecation and constipation. The patient describes the pain as sharp and burning. Onset was 50 year(s) ago. Recalls incision and drainage of thrombosed hemorrhoid decades ago.   Underwent colonoscopy. Mild polyps removed. Hemorrhoids were noted externally. Felt not to be a candidate for banding. Therefore surgical consultation requested.  Hemorrhoids noted.  Surgery offered - no new events.  Cardiac clearance done  Other Problems Carrie Hector, MD; 03/26/2014 9:20 AM) Anxiety Disorder Arthritis Asthma Chest pain Chronic Obstructive Lung Disease Depression Diverticulosis Gastroesophageal Reflux Disease Hemorrhoids Home Oxygen Use Hypercholesterolemia Pulmonary Embolism / Blood Clot in Legs ANTICOAGULATED (V58.61  Z79.01)  Past Surgical History Carrie [name], [place]; 03/26/2014 8:34 AM) Breast Biopsy multiple Colon Polyp Removal - Colonoscopy Hip Surgery Right. Shoulder Surgery Left.  Diagnostic Studies History Carrie [name], CMA; 03/26/2014 8:34 AM) Colonoscopy within last year Mammogram 1-3 years ago Pap Smear >5 years ago  Allergies Carrie [name], CMA; 03/26/2014 8:44 AM) Aspirin *ANALGESICS - NonNarcotic* Metoclopramide HCl *GASTROINTESTINAL AGENTS - MISC.* Morphine Sulfate  (Concentrate) *ANALGESICS - OPIOID* Tape 1"X5yd *MEDICAL DEVICES* Codeine Phosphate *ANALGESICS - OPIOID* Sulfa Antibiotics  Medication History (Carrie [name], CMA; 03/26/2014 8:49 AM) Tylenol Extra Strength (500MG  Tablet, Oral daily) Active. Albuterol Sulfate (Inhalation daily) Active. Astelin (137MCG/SPRAY Solution, Nasal as needed) Active. Esomeprazole Magnesium (20MG  Capsule DR, Oral daily) Active. Zetia (10MG  Tablet, Oral daily) Active. Allegra Allergy (180MG  Tablet, Oral daily) Active. Flonase Allergy Relief (50MCG/ACT Suspension, Nasal as needed) Active. Lasix (40MG  Tablet, Oral daily) Active. Ativan (0.5MG  Tablet, Oral as needed) Active. Singulair (10MG  Tablet, Oral daily) Active. Multivitamins (Oral daily) Active. Nitrostat (0.4MG  Tab Sublingual, Sublingual as needed) Active. Protonix (40MG  Packet, Oral daily) Active. Klor-Con [facility] Tablet ER, Oral daily) Active. TraZODone HCl (50MG  Tablet, Oral daily) Active.  Social History (Carrie [name]; 03/26/2014 8:34 AM) Caffeine use Carbonated beverages, Coffee. No alcohol use No drug use Tobacco use Never smoker.  Family History Carrie [name], [place]; 03/26/2014 8:34 AM) Alcohol Abuse Father. Anesthetic complications Mother. Arthritis Mother. Breast Cancer Mother. Colon Polyps Sister. Diabetes Mellitus Daughter, Mother. Heart Disease Brother, Father, Mother, Sister, Son. Heart disease in female family member before age 59 Heart disease in female family member before age 79 Hypertension Brother, Daughter. Seizure disorder Daughter, Son. Thyroid problems Daughter.  Pregnancy / Birth History Carrie [name], CMA; 03/26/2014 8:34 AM) Age of menopause 32-50 Gravida 2 Maternal age 69-25 Para 2  Review of Systems Carrie Hector MD; 03/26/2014 9:02 AM) General Present- Fatigue. Not Present- Appetite Loss, Chills, Fever, Night Sweats, Weight Gain and Weight Loss. Skin Not Present- Change in  Wart/Mole, Dryness, Hives, Jaundice, New Lesions, Non-Healing Wounds, Rash and Ulcer. HEENT Present- Nose Bleed, Seasonal Allergies, Sinus Pain and Wears glasses/contact lenses. Not Present- Earache, Hearing Loss, Hoarseness, Oral Ulcers, Ringing in the Ears, Sore Throat, Visual Disturbances and Yellow Eyes. Respiratory Present- Chronic Cough, Difficulty Breathing and  Snoring. Not Present- Bloody sputum and Wheezing. Breast Not Present- Breast Mass, Breast Pain, Nipple Discharge and Skin Changes. Cardiovascular Present- Leg Cramps, Shortness of Breath and Swelling of Extremities. Not Present- Chest Pain, Difficulty Breathing Lying Down, Palpitations and Rapid Heart Rate. Gastrointestinal Present- Bloating, Constipation, Difficulty Swallowing, Excessive gas, Hemorrhoids, Indigestion, Pain with Bowel Movement and Rectal Pain. Not Present- Abdominal Pain, Bloody Stool, Change in Bowel Habits, Chronic diarrhea, Gets full quickly at meals, Nausea and Vomiting. Female Genitourinary Not Present- Frequency, Nocturia, Painful Urination, Pelvic Pain and Urgency. Neurological Present- Decreased Memory, Headaches, Trouble walking and Weakness. Not Present- Fainting, Numbness, Seizures, Tingling and Tremor. Psychiatric Present- Anxiety and Depression. Not Present- Bipolar, Change in Sleep Pattern, Fearful and Frequent crying. Endocrine Not Present- Cold Intolerance, Excessive Hunger, Hair Changes, Heat Intolerance, Hot flashes and New Diabetes. Hematology Present- Easy Bruising. Not Present- Excessive bleeding, Gland problems, HIV and Persistent Infections.   Vitals (Carrie [name] CMA; 03/26/2014 8:37 AM) 03/26/2014 8:36 AM Weight: 181 lb Temp.: 97.58F(Temporal)  Pulse: 78 (Regular)  BP: 126/78 (Sitting, Left Arm, Standard)    Physical Exam Carrie Hector MD; 03/26/2014 9:00 AM) General Mental Status-Alert. General Appearance-Not in acute distress, Not Sickly. Orientation-Oriented  X3. Hydration-Well hydrated. Voice-Normal.  Integumentary Global Assessment Upon inspection and palpation of skin surfaces of the - Axillae: non-tender, no inflammation or ulceration, no drainage. and Distribution of scalp and body hair is normal. General Characteristics Temperature - normal warmth is noted.  Head and Neck Head-normocephalic, atraumatic with no lesions or palpable masses. Face Global Assessment - atraumatic, no absence of expression. Neck Global Assessment - no abnormal movements, no bruit auscultated on the right, no bruit auscultated on the left, no decreased range of motion, non-tender. Trachea-midline. Thyroid Gland Characteristics - non-tender.  Eye Eyeball - Left-Extraocular movements intact, No Nystagmus. Eyeball - Right-Extraocular movements intact, No Nystagmus. Cornea - Left-No Hazy. Cornea - Right-No Hazy. Sclera/Conjunctiva - Left-No scleral icterus, No Discharge. Sclera/Conjunctiva - Right-No scleral icterus, No Discharge. Pupil - Left-Direct reaction to light normal. Pupil - Right-Direct reaction to light normal.  ENMT Ears Pinna - Left - no drainage observed, no generalized tenderness observed. Right - no drainage observed, no generalized tenderness observed. Nose and Sinuses External Inspection of the Nose - no destructive lesion observed. Inspection of the nares - Left - quiet respiration. Right - quiet respiration. Mouth and Throat Lips - Upper Lip - no fissures observed, no pallor noted. Lower Lip - no fissures observed, no pallor noted. Nasopharynx - no discharge present. Oral Cavity/Oropharynx - Tongue - no dryness observed. Oral Mucosa - no cyanosis observed. Hypopharynx - no evidence of airway distress observed.  Chest and Lung Exam Inspection Movements - Normal and Symmetrical. Accessory muscles - No use of accessory muscles in breathing. Palpation Palpation of the chest reveals -  Non-tender. Auscultation Breath sounds - Normal and Clear.  Cardiovascular Auscultation Rhythm - Regular. Murmurs & Other Heart Sounds - Auscultation of the heart reveals - No Murmurs and No Systolic Clicks.  Abdomen Inspection Inspection of the abdomen reveals - No Visible peristalsis and No Abnormal pulsations. Umbilicus - No Bleeding, No Urine drainage. Palpation/Percussion Palpation and Percussion of the abdomen reveal - Soft, Non Tender, No Rebound tenderness, No Rigidity (guarding) and No Cutaneous hyperesthesia.  Female Genitourinary Sexual Maturity Tanner 5 - Adult hair pattern. Note: No vaginal bleeding nor discharge   Rectal Anorectal Exam External - generalized tenderness and external hemorrhoids, no anorectal fistula, no pilonidal cysts, no pilonidal sinuses. Internal - generalized  tenderness, internal hemorrhoids, no anal strictures.  Note: Exam done with assistance of female Medical Assistant in the room. Perianal skin clean with good hygiene. No pruritis. No pilonidal disease. No active fissure but scarring c/w old fissure. No abscess/fistula. Tolerates digital and anoscopic rectal exam but very sensitive. No rectal masses. Hemorrhoidal piles enlarged  External hemorrhoids R anterior large > R post = L lat. Very sensitive Normal sphincter tone.   Peripheral Vascular Upper Extremity Inspection - Left - No Cyanotic nailbeds, Not Ischemic. Right - No Cyanotic nailbeds, Not Ischemic.  Neurologic Neurologic evaluation reveals -normal attention span and ability to concentrate, able to name objects and repeat phrases. Appropriate fund of knowledge , normal sensation and normal coordination. Mental Status Affect - not angry, not paranoid. Cranial Nerves-Normal Bilaterally. Gait-Normal.  Neuropsychiatric Mental status exam performed with findings of-able to articulate well with normal speech/language, rate, volume and coherence, thought content normal with  ability to perform basic computations and apply abstract reasoning and no evidence of hallucinations, delusions, obsessions or homicidal/suicidal ideation.  Musculoskeletal Global Assessment Spine, Ribs and Pelvis - no instability, subluxation or laxity. Right Upper Extremity - no instability, subluxation or laxity.  Lymphatic Head & Neck  General Head & Neck Lymphatics: Bilateral - Description - No Localized lymphadenopathy. Axillary  General Axillary Region: Bilateral - Description - No Localized lymphadenopathy. Femoral & Inguinal  Generalized Femoral & Inguinal Lymphatics: Left - Description - No Localized lymphadenopathy. Right - Description - No Localized lymphadenopathy.    Assessment & Plan Carrie Hector MD; 03/26/2014 9:21 AM) INFLAMED EXTERNAL HEMORRHOID (455.3  K64.8) Current Plans  Pt Education - [facility] Hemorrhoids (Carrie [name]) Pt Education - [facility] Rectal Surgery HCI Schedule for Surgery:  I think she would benefit from [facility] internal ligation/pexy and external hemorrhoidectomy of remaining tissues. She and her daughter agree:  The anatomy & physiology of the anorectal region was discussed. The pathophysiology of hemorrhoids and differential diagnosis was discussed. Natural history risks without surgery was discussed. I stressed the importance of a bowel regimen to have daily soft bowel movements to minimize progression of disease. Interventions such as sclerotherapy & banding were discussed.  The patient's symptoms are not adequately controlled by medicines and other non-operative treatments. I feel the risks & problems of no surgery outweigh the operative risks; therefore, I recommended surgery to treat the hemorrhoids by ligation, pexy, and possible resection.  Risks such as bleeding, infection, urinary difficulties, need for further treatment, heart attack, death, and other risks were discussed. I noted a good likelihood this will help address the problem. Goals of  post-operative recovery were discussed as well. Possibility that this will not correct all symptoms was explained. Post-operative pain, bleeding, constipation, and other problems after surgery were discussed. We will work to minimize complications. Educational handouts further explaining the pathology, treatment options, and bowel regimen were given as well. Questions were answered. The patient expresses understanding & wishes to proceed with surgery.   Carrie [name], M.D., F.A.C.S. Gastrointestinal and Minimally Invasive Surgery [facility] [address] [place], [place] [address] [phone] Main / Paging

## 2014-05-01 NOTE — Anesthesia Preprocedure Evaluation (Addendum)
Anesthesia Evaluation  Patient identified by MRN, date of birth, ID band Patient awake    Reviewed: Allergy & Precautions, H&P , NPO status , Patient's Chart, lab work & pertinent test results  Airway Mallampati: II  TM Distance: <3 FB Neck ROM: Full    Dental no notable dental hx.    Pulmonary COPD oxygen dependent,  O2 dep at nite breath sounds clear to auscultation  Pulmonary exam normal       Cardiovascular + CAD, + Cardiac Stents and DVT Rhythm:Regular Rate:Normal     Neuro/Psych negative neurological ROS  negative psych ROS   GI/Hepatic Neg liver ROS,   Endo/Other  negative endocrine ROS  Renal/GU negative Renal ROS  negative genitourinary   Musculoskeletal negative musculoskeletal ROS (+)   Abdominal   Peds negative pediatric ROS (+)  Hematology Chronic anticoagulation  Warfarin led to variable INRs; now Rx with rivaroxaban     Anesthesia Other Findings   Reproductive/Obstetrics negative OB ROS                             Anesthesia Physical Anesthesia Plan  ASA: III  Anesthesia Plan: General   Post-op Pain Management:    Induction: Intravenous  Airway Management Planned: Oral ETT  Additional Equipment:   Intra-op Plan:   Post-operative Plan: Extubation in OR  Informed Consent: I have reviewed the patients History and Physical, chart, labs and discussed the procedure including the risks, benefits and alternatives for the proposed anesthesia with the patient or authorized representative who has indicated his/her understanding and acceptance.   Dental advisory given  Plan Discussed with: CRNA and Surgeon  Anesthesia Plan Comments:         Anesthesia Quick Evaluation

## 2014-05-01 NOTE — Anesthesia Postprocedure Evaluation (Signed)
  Anesthesia Post-op Note  Patient: Carrie [name]  Procedure(s) Performed: Procedure(s) (LRB): TRANSANAL HEMORRHOIDAL DEARTERIALIZATION LIGATION PEXY.EXTERNAL HEMORRHOIDECTOMY AND EXAM UNDER ANESTHESIA (N/A)  Patient Location: PACU  Anesthesia Type: General  Level of Consciousness: awake and alert   Airway and Oxygen Therapy: Patient Spontanous Breathing  Post-op Pain: mild  Post-op Assessment: Post-op Vital signs reviewed, Patient's Cardiovascular Status Stable, Respiratory Function Stable, Patent Airway and No signs of Nausea or vomiting  Last Vitals:  Filed Vitals:   05/01/14 1715  BP: 144/70  Pulse: 109  Temp:   Resp: 22    Post-op Vital Signs: stable   Complications: No apparent anesthesia complications

## 2014-05-02 ENCOUNTER — Encounter (HOSPITAL_COMMUNITY): Payer: Self-pay | Admitting: Surgery

## 2014-05-14 ENCOUNTER — Ambulatory Visit: Payer: Medicare Other | Admitting: Adult Health

## 2014-05-28 ENCOUNTER — Encounter (HOSPITAL_COMMUNITY): Payer: Self-pay

## 2014-05-28 ENCOUNTER — Emergency Department (HOSPITAL_COMMUNITY): Payer: No Typology Code available for payment source

## 2014-05-28 ENCOUNTER — Emergency Department (HOSPITAL_COMMUNITY)
Admission: EM | Admit: 2014-05-28 | Discharge: 2014-05-28 | Disposition: A | Discharge status: Home / Self Care | Payer: No Typology Code available for payment source | Attending: Emergency Medicine | Admitting: Emergency Medicine

## 2014-05-28 DIAGNOSIS — Z880 Allergy status to penicillin: Secondary | ICD-10-CM | POA: Diagnosis not present

## 2014-05-28 DIAGNOSIS — M1990 Unspecified osteoarthritis, unspecified site: Secondary | ICD-10-CM | POA: Insufficient documentation

## 2014-05-28 DIAGNOSIS — Y92410 Unspecified street and highway as the place of occurrence of the external cause: Secondary | ICD-10-CM | POA: Diagnosis not present

## 2014-05-28 DIAGNOSIS — Z955 Presence of coronary angioplasty implant and graft: Secondary | ICD-10-CM | POA: Diagnosis not present

## 2014-05-28 DIAGNOSIS — I2510 Atherosclerotic heart disease of native coronary artery without angina pectoris: Secondary | ICD-10-CM | POA: Insufficient documentation

## 2014-05-28 DIAGNOSIS — J449 Chronic obstructive pulmonary disease, unspecified: Secondary | ICD-10-CM | POA: Insufficient documentation

## 2014-05-28 DIAGNOSIS — F0390 Unspecified dementia without behavioral disturbance: Secondary | ICD-10-CM | POA: Diagnosis not present

## 2014-05-28 DIAGNOSIS — Z8701 Personal history of pneumonia (recurrent): Secondary | ICD-10-CM | POA: Insufficient documentation

## 2014-05-28 DIAGNOSIS — Y9389 Activity, other specified: Secondary | ICD-10-CM | POA: Diagnosis not present

## 2014-05-28 DIAGNOSIS — M549 Dorsalgia, unspecified: Secondary | ICD-10-CM

## 2014-05-28 DIAGNOSIS — Y998 Other external cause status: Secondary | ICD-10-CM | POA: Diagnosis not present

## 2014-05-28 DIAGNOSIS — Z9981 Dependence on supplemental oxygen: Secondary | ICD-10-CM | POA: Insufficient documentation

## 2014-05-28 DIAGNOSIS — S0990XA Unspecified injury of head, initial encounter: Secondary | ICD-10-CM | POA: Diagnosis not present

## 2014-05-28 DIAGNOSIS — Z86711 Personal history of pulmonary embolism: Secondary | ICD-10-CM | POA: Insufficient documentation

## 2014-05-28 DIAGNOSIS — Z792 Long term (current) use of antibiotics: Secondary | ICD-10-CM | POA: Insufficient documentation

## 2014-05-28 DIAGNOSIS — Z7951 Long term (current) use of inhaled steroids: Secondary | ICD-10-CM | POA: Insufficient documentation

## 2014-05-28 DIAGNOSIS — Z8639 Personal history of other endocrine, nutritional and metabolic disease: Secondary | ICD-10-CM | POA: Insufficient documentation

## 2014-05-28 DIAGNOSIS — K219 Gastro-esophageal reflux disease without esophagitis: Secondary | ICD-10-CM | POA: Insufficient documentation

## 2014-05-28 DIAGNOSIS — Z85820 Personal history of malignant melanoma of skin: Secondary | ICD-10-CM | POA: Diagnosis not present

## 2014-05-28 DIAGNOSIS — Z79899 Other long term (current) drug therapy: Secondary | ICD-10-CM | POA: Diagnosis not present

## 2014-05-28 DIAGNOSIS — S3992XA Unspecified injury of lower back, initial encounter: Secondary | ICD-10-CM

## 2014-05-28 DIAGNOSIS — S199XXA Unspecified injury of neck, initial encounter: Secondary | ICD-10-CM

## 2014-05-28 MED ORDER — HYDROCODONE-ACETAMINOPHEN 5-325 MG PO TABS
1.0000 | ORAL_TABLET | Freq: Once | ORAL | Status: AC
  Administered 2014-05-28: 1 via ORAL
  Filled 2014-05-28: qty 1

## 2014-05-28 MED ORDER — HYDROCODONE-ACETAMINOPHEN 5-325 MG PO TABS: 1.0000 | ORAL_TABLET | ORAL | Status: DC | PRN

## 2014-05-28 NOTE — ED Notes (Signed)
C-collar removed with permission from [facility].
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
## 2014-05-28 NOTE — ED Notes (Signed)
Patient needed to use bathroom, but didn't want to use bedpan,  I informed her that her neck hadn't been clear yet to get up, so she stated she would hold her urine until she come back from CT.
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
## 2014-05-28 NOTE — ED Notes (Signed)
Patient returned back from CT, and placed back on monitor.
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
## 2014-05-29 ENCOUNTER — Encounter (HOSPITAL_COMMUNITY): Payer: Self-pay | Admitting: Cardiology

## 2014-05-30 ENCOUNTER — Other Ambulatory Visit (HOSPITAL_COMMUNITY): Payer: Self-pay | Admitting: Internal Medicine

## 2014-05-30 ENCOUNTER — Ambulatory Visit (HOSPITAL_COMMUNITY)
Admission: RE | Admit: 2014-05-30 | Discharge: 2014-05-30 | Disposition: A | Discharge status: Home / Self Care | Payer: Medicare Other | Source: Ambulatory Visit | Attending: Internal Medicine | Admitting: Internal Medicine

## 2014-05-30 DIAGNOSIS — M25512 Pain in left shoulder: Secondary | ICD-10-CM | POA: Insufficient documentation

## 2014-06-25 ENCOUNTER — Other Ambulatory Visit (INDEPENDENT_AMBULATORY_CARE_PROVIDER_SITE_OTHER): Payer: Self-pay | Admitting: Surgery

## 2014-06-25 ENCOUNTER — Other Ambulatory Visit: Payer: Self-pay | Admitting: Cardiology
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
## 2014-06-28 DIAGNOSIS — J449 Chronic obstructive pulmonary disease, unspecified: Secondary | ICD-10-CM | POA: Diagnosis not present

## 2014-06-28 DIAGNOSIS — R1310 Dysphagia, unspecified: Secondary | ICD-10-CM | POA: Diagnosis not present

## 2014-07-03 ENCOUNTER — Encounter (HOSPITAL_COMMUNITY): Payer: Self-pay | Admitting: Cardiology

## 2014-07-10 DIAGNOSIS — M25512 Pain in left shoulder: Secondary | ICD-10-CM | POA: Diagnosis not present

## 2014-07-29 DIAGNOSIS — R1310 Dysphagia, unspecified: Secondary | ICD-10-CM | POA: Diagnosis not present

## 2014-07-29 DIAGNOSIS — J449 Chronic obstructive pulmonary disease, unspecified: Secondary | ICD-10-CM | POA: Diagnosis not present

## 2014-08-08 DIAGNOSIS — J4550 Severe persistent asthma, uncomplicated: Secondary | ICD-10-CM | POA: Diagnosis not present

## 2014-08-08 DIAGNOSIS — R001 Bradycardia, unspecified: Secondary | ICD-10-CM | POA: Diagnosis not present

## 2014-08-08 DIAGNOSIS — Z86711 Personal history of pulmonary embolism: Secondary | ICD-10-CM | POA: Diagnosis not present

## 2014-08-08 DIAGNOSIS — I2510 Atherosclerotic heart disease of native coronary artery without angina pectoris: Secondary | ICD-10-CM | POA: Diagnosis not present

## 2014-08-08 DIAGNOSIS — Z01818 Encounter for other preprocedural examination: Secondary | ICD-10-CM | POA: Diagnosis not present

## 2014-08-08 DIAGNOSIS — Z9861 Coronary angioplasty status: Secondary | ICD-10-CM | POA: Diagnosis not present

## 2014-08-08 DIAGNOSIS — I259 Chronic ischemic heart disease, unspecified: Secondary | ICD-10-CM | POA: Diagnosis not present

## 2014-08-08 DIAGNOSIS — Z86718 Personal history of other venous thrombosis and embolism: Secondary | ICD-10-CM | POA: Diagnosis not present

## 2014-08-08 DIAGNOSIS — F419 Anxiety disorder, unspecified: Secondary | ICD-10-CM | POA: Diagnosis not present

## 2014-08-14 DIAGNOSIS — E780 Pure hypercholesterolemia: Secondary | ICD-10-CM | POA: Diagnosis not present

## 2014-08-14 DIAGNOSIS — N393 Stress incontinence (female) (male): Secondary | ICD-10-CM | POA: Diagnosis not present

## 2014-08-14 DIAGNOSIS — F0390 Unspecified dementia without behavioral disturbance: Secondary | ICD-10-CM | POA: Diagnosis not present

## 2014-08-14 DIAGNOSIS — I2699 Other pulmonary embolism without acute cor pulmonale: Secondary | ICD-10-CM | POA: Diagnosis not present

## 2014-08-14 DIAGNOSIS — J4550 Severe persistent asthma, uncomplicated: Secondary | ICD-10-CM | POA: Diagnosis not present

## 2014-08-20 DIAGNOSIS — Z1231 Encounter for screening mammogram for malignant neoplasm of breast: Secondary | ICD-10-CM | POA: Diagnosis not present

## 2014-08-22 DIAGNOSIS — J4550 Severe persistent asthma, uncomplicated: Secondary | ICD-10-CM | POA: Diagnosis not present

## 2014-08-22 DIAGNOSIS — J0100 Acute maxillary sinusitis, unspecified: Secondary | ICD-10-CM | POA: Diagnosis not present

## 2014-08-22 DIAGNOSIS — E780 Pure hypercholesterolemia: Secondary | ICD-10-CM | POA: Diagnosis not present

## 2014-08-22 DIAGNOSIS — K219 Gastro-esophageal reflux disease without esophagitis: Secondary | ICD-10-CM | POA: Diagnosis not present

## 2014-08-27 DIAGNOSIS — R1310 Dysphagia, unspecified: Secondary | ICD-10-CM | POA: Diagnosis not present

## 2014-08-27 DIAGNOSIS — J449 Chronic obstructive pulmonary disease, unspecified: Secondary | ICD-10-CM | POA: Diagnosis not present

## 2014-08-27 NOTE — Progress Notes (Signed)
Please put orders in Epic surgery 09-08-14 pre op 09-01-14 Thanks

## 2014-08-29 ENCOUNTER — Other Ambulatory Visit (INDEPENDENT_AMBULATORY_CARE_PROVIDER_SITE_OTHER): Payer: Self-pay | Admitting: Surgery

## 2014-08-29 NOTE — Patient Instructions (Addendum)
Carrie [name]  08/29/2014   Your procedure is scheduled on: Monday 09/08/14   Report to [facility] Main  Entrance and follow signs to               [place] at 05:30 AM.  Call this number if you have problems the morning of surgery [phone]   Remember: Please bring inhaler on day of surgery.  Do not eat food or drink liquids :After Midnight.     Take these medicines the morning of surgery with A SIP OF WATER: inhaler, nose spray, zithromax, allegra, nebulizer, lorazepam, pantoprazole                               You may not have any metal on your body including hair pins and              piercings  Do not wear jewelry, make-up, lotions, powders or perfumes.             Do not wear nail polish.  Do not shave  48 hours prior to surgery.              Men may shave face and neck.  Do not bring valuables to the hospital. [place].  Contacts, dentures or bridgework may not be worn into surgery.    Patients discharged the day of surgery will not be allowed to drive home.  Name and phone number of your driver: Jenny Reichmann (daughter) [phone]   _____________________________________________________________________             [facility] - Preparing for Surgery Before surgery, you can play an important role.  Because skin is not sterile, your skin needs to be as free of germs as possible.  You can reduce the number of germs on your skin by washing with CHG (chlorahexidine gluconate) soap before surgery.  CHG is an antiseptic cleaner which kills germs and bonds with the skin to continue killing germs even after washing. Please DO NOT use if you have an allergy to CHG or antibacterial soaps.  If your skin becomes reddened/irritated stop using the CHG and inform your nurse when you arrive at Short Stay. Do not shave (including legs and underarms) for at least 48 hours prior to the first CHG shower.  You may  shave your face/neck. Please follow these instructions carefully:  1.  Shower with CHG Soap the night before surgery and the  morning of Surgery.  2.  If you choose to wash your hair, wash your hair first as usual with your  normal  shampoo.  3.  After you shampoo, rinse your hair and body thoroughly to remove the  shampoo.                            4.  Use CHG as you would any other liquid soap.  You can apply chg directly  to the skin and wash                       Gently with a scrungie or clean washcloth.  5.  Apply the CHG Soap to your body ONLY FROM THE NECK DOWN.  Do not use on face/ open                           Wound or open sores. Avoid contact with eyes, ears mouth and genitals (private parts).                       Wash face,  Genitals (private parts) with your normal soap.             6.  Wash thoroughly, paying special attention to the area where your surgery  will be performed.  7.  Thoroughly rinse your body with warm water from the neck down.  8.  DO NOT shower/wash with your normal soap after using and rinsing off  the CHG Soap.                9.  Pat yourself dry with a clean towel.            10.  Wear clean pajamas.            11.  Place clean sheets on your bed the night of your first shower and do not  sleep with pets. Day of Surgery : Do not apply any lotions/deodorants the morning of surgery.  Please wear clean clothes to the hospital/surgery center.  FAILURE TO FOLLOW THESE INSTRUCTIONS MAY RESULT IN THE CANCELLATION OF YOUR SURGERY PATIENT SIGNATURE_________________________________  NURSE SIGNATURE__________________________________  ________________________________________________________________________
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
## 2014-08-29 NOTE — Progress Notes (Signed)
EKG 08/08/14 on chart, LOV note with clearance 08/08/14 Dr. Norman Clay on chart, LOV note with pulmonary function test results 05/2014 Carrie Clas FNP on chart

## 2014-09-01 ENCOUNTER — Encounter (HOSPITAL_COMMUNITY): Payer: Self-pay

## 2014-09-01 ENCOUNTER — Ambulatory Visit (HOSPITAL_COMMUNITY)
Admission: RE | Admit: 2014-09-01 | Discharge: 2014-09-01 | Disposition: A | Discharge status: Home / Self Care | Payer: Medicare Other | Source: Ambulatory Visit | Attending: Anesthesiology | Admitting: Anesthesiology

## 2014-09-01 ENCOUNTER — Encounter (HOSPITAL_COMMUNITY)
Admission: RE | Admit: 2014-09-01 | Discharge: 2014-09-01 | Disposition: A | Discharge status: Home / Self Care | Payer: Medicare Other | Source: Ambulatory Visit | Attending: Surgery | Admitting: Surgery

## 2014-09-01 ENCOUNTER — Encounter (HOSPITAL_COMMUNITY): Payer: Self-pay | Admitting: General Practice

## 2014-09-01 DIAGNOSIS — R059 Cough, unspecified: Secondary | ICD-10-CM

## 2014-09-01 DIAGNOSIS — R05 Cough: Secondary | ICD-10-CM

## 2014-09-01 DIAGNOSIS — R0789 Other chest pain: Secondary | ICD-10-CM | POA: Insufficient documentation

## 2014-09-01 DIAGNOSIS — Z01818 Encounter for other preprocedural examination: Secondary | ICD-10-CM | POA: Insufficient documentation

## 2014-09-01 DIAGNOSIS — K649 Unspecified hemorrhoids: Secondary | ICD-10-CM | POA: Diagnosis not present

## 2014-09-01 HISTORY — DX: Reserved for inherently not codable concepts without codable children: IMO0001

## 2014-09-01 LAB — BASIC METABOLIC PANEL
ANION GAP: 9 (ref 5–15)
BUN: 18 mg/dL (ref 6–23)
CALCIUM: 9.6 mg/dL (ref 8.4–10.5)
CO2: 29 mmol/L (ref 19–32)
Chloride: 104 mmol/L (ref 96–112)
Creatinine, Ser: 0.66 mg/dL (ref 0.50–1.10)
GFR calc Af Amer: >90 mL/min (ref >90)
GFR, EST NON AFRICAN AMERICAN: 86 mL/min — AB (ref >90)
Glucose, Bld: 98 mg/dL (ref 70–99)
Potassium: 4.4 mmol/L (ref 3.5–5.1)
SODIUM: 142 mmol/L (ref 135–145)

## 2014-09-01 LAB — CBC
HCT: 39.9 % (ref 36.0–46.0)
Hemoglobin: 12.4 g/dL (ref 12.0–15.0)
MCH: 29.5 pg (ref 26.0–34.0)
MCHC: 31.1 g/dL (ref 30.0–36.0)
MCV: 95 fL (ref 78.0–100.0)
PLATELETS: 286 10*3/uL (ref 150–400)
RBC: 4.2 MIL/uL (ref 3.87–5.11)
RDW: 13.4 % (ref 11.5–15.5)
WBC: 10.1 10*3/uL (ref 4.0–10.5)

## 2014-09-08 ENCOUNTER — Encounter (HOSPITAL_COMMUNITY): Payer: Self-pay

## 2014-09-08 ENCOUNTER — Ambulatory Visit (HOSPITAL_COMMUNITY): Payer: Medicare Other | Admitting: Anesthesiology

## 2014-09-08 ENCOUNTER — Encounter (HOSPITAL_COMMUNITY)
Admission: RE | Disposition: A | Discharge status: Home / Self Care | Payer: Self-pay | Source: Ambulatory Visit | Attending: Surgery

## 2014-09-08 ENCOUNTER — Ambulatory Visit (HOSPITAL_COMMUNITY)
Admission: RE | Admit: 2014-09-08 | Discharge: 2014-09-08 | Disposition: A | Discharge status: Home / Self Care | Payer: Medicare Other | Source: Ambulatory Visit | Attending: Surgery | Admitting: Surgery

## 2014-09-08 DIAGNOSIS — R51 Headache: Secondary | ICD-10-CM | POA: Diagnosis not present

## 2014-09-08 DIAGNOSIS — K648 Other hemorrhoids: Secondary | ICD-10-CM | POA: Insufficient documentation

## 2014-09-08 DIAGNOSIS — J45909 Unspecified asthma, uncomplicated: Secondary | ICD-10-CM | POA: Insufficient documentation

## 2014-09-08 DIAGNOSIS — F329 Major depressive disorder, single episode, unspecified: Secondary | ICD-10-CM | POA: Diagnosis not present

## 2014-09-08 DIAGNOSIS — M1990 Unspecified osteoarthritis, unspecified site: Secondary | ICD-10-CM | POA: Insufficient documentation

## 2014-09-08 DIAGNOSIS — Z79891 Long term (current) use of opiate analgesic: Secondary | ICD-10-CM | POA: Insufficient documentation

## 2014-09-08 DIAGNOSIS — Z79899 Other long term (current) drug therapy: Secondary | ICD-10-CM | POA: Diagnosis not present

## 2014-09-08 DIAGNOSIS — Z955 Presence of coronary angioplasty implant and graft: Secondary | ICD-10-CM | POA: Insufficient documentation

## 2014-09-08 DIAGNOSIS — Z9981 Dependence on supplemental oxygen: Secondary | ICD-10-CM | POA: Diagnosis not present

## 2014-09-08 DIAGNOSIS — K644 Residual hemorrhoidal skin tags: Secondary | ICD-10-CM | POA: Diagnosis not present

## 2014-09-08 DIAGNOSIS — K649 Unspecified hemorrhoids: Secondary | ICD-10-CM | POA: Diagnosis not present

## 2014-09-08 DIAGNOSIS — K449 Diaphragmatic hernia without obstruction or gangrene: Secondary | ICD-10-CM | POA: Insufficient documentation

## 2014-09-08 DIAGNOSIS — I739 Peripheral vascular disease, unspecified: Secondary | ICD-10-CM | POA: Diagnosis not present

## 2014-09-08 DIAGNOSIS — F419 Anxiety disorder, unspecified: Secondary | ICD-10-CM | POA: Insufficient documentation

## 2014-09-08 DIAGNOSIS — J449 Chronic obstructive pulmonary disease, unspecified: Secondary | ICD-10-CM | POA: Diagnosis not present

## 2014-09-08 DIAGNOSIS — I2510 Atherosclerotic heart disease of native coronary artery without angina pectoris: Secondary | ICD-10-CM | POA: Diagnosis not present

## 2014-09-08 DIAGNOSIS — K219 Gastro-esophageal reflux disease without esophagitis: Secondary | ICD-10-CM | POA: Diagnosis not present

## 2014-09-08 DIAGNOSIS — K6289 Other specified diseases of anus and rectum: Secondary | ICD-10-CM | POA: Diagnosis not present

## 2014-09-08 HISTORY — DX: Acute embolism and thrombosis of unspecified deep veins of unspecified lower extremity: I82.409

## 2014-09-08 HISTORY — PX: HEMORRHOID SURGERY: SHX153

## 2014-09-08 HISTORY — DX: Repeated falls: R29.6

## 2014-09-08 SURGERY — HEMORRHOIDECTOMY
Anesthesia: General

## 2014-09-08 MED ORDER — BUPIVACAINE LIPOSOME 1.3 % IJ SUSP
20.0000 mL | Freq: Once | INTRAMUSCULAR | Status: DC
  Filled 2014-09-08: qty 20

## 2014-09-08 MED ORDER — ONDANSETRON HCL 4 MG/2ML IJ SOLN
INTRAMUSCULAR | Status: AC
  Filled 2014-09-08: qty 2

## 2014-09-08 MED ORDER — BUPIVACAINE LIPOSOME 1.3 % IJ SUSP
INTRAMUSCULAR | Status: DC | PRN
  Administered 2014-09-08: 40 mL

## 2014-09-08 MED ORDER — KETOROLAC TROMETHAMINE 30 MG/ML IJ SOLN
INTRAMUSCULAR | Status: DC | PRN
  Administered 2014-09-08: 30 mg via INTRAVENOUS

## 2014-09-08 MED ORDER — DIPHENHYDRAMINE HCL 50 MG/ML IJ SOLN
6.2500 mg | Freq: Once | INTRAMUSCULAR | Status: AC
  Administered 2014-09-08: 6.25 mg via INTRAVENOUS

## 2014-09-08 MED ORDER — FENTANYL CITRATE 0.05 MG/ML IJ SOLN
INTRAMUSCULAR | Status: DC | PRN
  Administered 2014-09-08: 50 ug via INTRAVENOUS
  Administered 2014-09-08: 100 ug via INTRAVENOUS

## 2014-09-08 MED ORDER — KETOROLAC TROMETHAMINE 30 MG/ML IJ SOLN
INTRAMUSCULAR | Status: AC
  Filled 2014-09-08: qty 1

## 2014-09-08 MED ORDER — HYDROCODONE-ACETAMINOPHEN 5-325 MG PO TABS: 1.0000 | ORAL_TABLET | ORAL | Status: AC | PRN

## 2014-09-08 MED ORDER — FENTANYL CITRATE 0.05 MG/ML IJ SOLN
INTRAMUSCULAR | Status: AC
  Filled 2014-09-08: qty 5

## 2014-09-08 MED ORDER — EPHEDRINE SULFATE 50 MG/ML IJ SOLN
INTRAMUSCULAR | Status: AC
  Filled 2014-09-08: qty 1

## 2014-09-08 MED ORDER — ONDANSETRON HCL 4 MG/2ML IJ SOLN
INTRAMUSCULAR | Status: DC | PRN
  Administered 2014-09-08: 4 mg via INTRAVENOUS

## 2014-09-08 MED ORDER — SODIUM CHLORIDE 0.9 % IJ SOLN
INTRAMUSCULAR | Status: DC | PRN
Start: 2014-09-08 — End: 2014-09-08
  Administered 2014-09-08: 20 mL

## 2014-09-08 MED ORDER — HYDROMORPHONE HCL 1 MG/ML IJ SOLN
INTRAMUSCULAR | Status: AC
  Filled 2014-09-08: qty 1

## 2014-09-08 MED ORDER — ROCURONIUM BROMIDE 100 MG/10ML IV SOLN
INTRAVENOUS | Status: DC | PRN
  Administered 2014-09-08: 30 mg via INTRAVENOUS

## 2014-09-08 MED ORDER — MEPERIDINE HCL 50 MG/ML IJ SOLN: 6.2500 mg | INTRAMUSCULAR | Status: DC | PRN

## 2014-09-08 MED ORDER — PROPOFOL 10 MG/ML IV BOLUS
INTRAVENOUS | Status: DC | PRN
  Administered 2014-09-08: 100 mg via INTRAVENOUS

## 2014-09-08 MED ORDER — DIPHENHYDRAMINE HCL 50 MG/ML IJ SOLN
INTRAMUSCULAR | Status: AC
  Filled 2014-09-08: qty 1

## 2014-09-08 MED ORDER — LIDOCAINE HCL (CARDIAC) 20 MG/ML IV SOLN
INTRAVENOUS | Status: AC
  Filled 2014-09-08: qty 5

## 2014-09-08 MED ORDER — DEXAMETHASONE SODIUM PHOSPHATE 10 MG/ML IJ SOLN
INTRAMUSCULAR | Status: AC
  Filled 2014-09-08: qty 1

## 2014-09-08 MED ORDER — HYDROMORPHONE HCL 1 MG/ML IJ SOLN
0.2500 mg | INTRAMUSCULAR | Status: DC | PRN
  Administered 2014-09-08 (×3): 0.5 mg via INTRAVENOUS

## 2014-09-08 MED ORDER — DIBUCAINE 1 % RE OINT
TOPICAL_OINTMENT | RECTAL | Status: AC
  Filled 2014-09-08: qty 28

## 2014-09-08 MED ORDER — GLYCOPYRROLATE 0.2 MG/ML IJ SOLN
INTRAMUSCULAR | Status: AC
  Filled 2014-09-08: qty 2

## 2014-09-08 MED ORDER — NEOSTIGMINE METHYLSULFATE 10 MG/10ML IV SOLN
INTRAVENOUS | Status: AC
Start: 2014-09-08 — End: 2014-09-08
  Filled 2014-09-08: qty 1

## 2014-09-08 MED ORDER — ONDANSETRON HCL 4 MG/2ML IJ SOLN: 4.0000 mg | Freq: Once | INTRAMUSCULAR | Status: DC | PRN

## 2014-09-08 MED ORDER — SUCCINYLCHOLINE CHLORIDE 20 MG/ML IJ SOLN
INTRAMUSCULAR | Status: DC | PRN
  Administered 2014-09-08: 80 mg via INTRAVENOUS

## 2014-09-08 MED ORDER — GLYCOPYRROLATE 0.2 MG/ML IJ SOLN
INTRAMUSCULAR | Status: DC | PRN
  Administered 2014-09-08: 0.4 mg via INTRAVENOUS
  Administered 2014-09-08: 0.2 mg via INTRAVENOUS

## 2014-09-08 MED ORDER — LACTATED RINGERS IV SOLN
INTRAVENOUS | Status: DC | PRN
  Administered 2014-09-08: 07:00:00 via INTRAVENOUS

## 2014-09-08 MED ORDER — SODIUM CHLORIDE 0.9 % IJ SOLN
INTRAMUSCULAR | Status: AC
  Filled 2014-09-08: qty 50

## 2014-09-08 MED ORDER — ROCURONIUM BROMIDE 100 MG/10ML IV SOLN
INTRAVENOUS | Status: AC
  Filled 2014-09-08: qty 1

## 2014-09-08 MED ORDER — PROPOFOL 10 MG/ML IV BOLUS
INTRAVENOUS | Status: AC
  Filled 2014-09-08: qty 20

## 2014-09-08 MED ORDER — NEOSTIGMINE METHYLSULFATE 10 MG/10ML IV SOLN
INTRAVENOUS | Status: DC | PRN
  Administered 2014-09-08: 3 mg via INTRAVENOUS

## 2014-09-08 MED ORDER — LIDOCAINE HCL (CARDIAC) 20 MG/ML IV SOLN
INTRAVENOUS | Status: DC | PRN
  Administered 2014-09-08: 50 mg via INTRAVENOUS

## 2014-09-08 MED ORDER — DEXAMETHASONE SODIUM PHOSPHATE 10 MG/ML IJ SOLN
INTRAMUSCULAR | Status: DC | PRN
  Administered 2014-09-08: 10 mg via INTRAVENOUS

## 2014-09-08 MED ORDER — SODIUM CHLORIDE 0.9 % IJ SOLN
INTRAMUSCULAR | Status: AC
  Filled 2014-09-08: qty 10

## 2014-09-08 SURGICAL SUPPLY — 30 items
BLADE HEX COATED 2.75 (ELECTRODE) ×3 IMPLANT
BLADE SURG 15 STRL LF DISP TIS (BLADE) ×1 IMPLANT
BLADE SURG 15 STRL SS (BLADE) ×3
BRIEF STRETCH FOR OB PAD LRG (UNDERPADS AND DIAPERS) ×3 IMPLANT
DECANTER SPIKE VIAL GLASS SM (MISCELLANEOUS) ×3 IMPLANT
DRAPE LAPAROTOMY T 102X78X121 (DRAPES) ×3 IMPLANT
DRSG PAD ABDOMINAL 8X10 ST (GAUZE/BANDAGES/DRESSINGS) IMPLANT
ELECT REM PT RETURN 9FT ADLT (ELECTROSURGICAL) ×3
ELECTRODE REM PT RTRN 9FT ADLT (ELECTROSURGICAL) ×1 IMPLANT
GAUZE SPONGE 4X4 12PLY STRL (GAUZE/BANDAGES/DRESSINGS) IMPLANT
GAUZE SPONGE 4X4 16PLY XRAY LF (GAUZE/BANDAGES/DRESSINGS) ×3 IMPLANT
GLOVE ECLIPSE 8.0 STRL XLNG CF (GLOVE) ×3 IMPLANT
GLOVE INDICATOR 8.0 STRL GRN (GLOVE) ×3 IMPLANT
GOWN STRL REUS W/TWL XL LVL3 (GOWN DISPOSABLE) ×8 IMPLANT
KIT BASIN OR (CUSTOM PROCEDURE TRAY) ×3 IMPLANT
LUBRICANT JELLY K Y 4OZ (MISCELLANEOUS) ×3 IMPLANT
NEEDLE HYPO 22GX1.5 SAFETY (NEEDLE) ×3 IMPLANT
PACK BASIC VI WITH GOWN DISP (CUSTOM PROCEDURE TRAY) ×3 IMPLANT
PENCIL BUTTON HOLSTER BLD 10FT (ELECTRODE) ×3 IMPLANT
SCRUB PCMX 4 OZ (MISCELLANEOUS) ×3 IMPLANT
SHEARS HARMONIC 9CM CVD (BLADE) IMPLANT
SUT CHROMIC 2 0 SH (SUTURE) IMPLANT
SUT CHROMIC 3 0 SH 27 (SUTURE) ×6 IMPLANT
SUT VIC AB 2-0 SH 27 (SUTURE)
SUT VIC AB 2-0 SH 27X BRD (SUTURE) IMPLANT
SUT VIC AB 2-0 UR6 27 (SUTURE) ×2 IMPLANT
SYR 20CC LL (SYRINGE) ×3 IMPLANT
TOWEL OR 17X26 10 PK STRL BLUE (TOWEL DISPOSABLE) ×3 IMPLANT
TOWEL OR NON WOVEN STRL DISP B (DISPOSABLE) ×3 IMPLANT
YANKAUER SUCT BULB TIP 10FT TU (MISCELLANEOUS) ×3 IMPLANT
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
## 2014-09-08 NOTE — Anesthesia Postprocedure Evaluation (Signed)
Anesthesia Post Note  Patient: Carrie [name]  Procedure(s) Performed: Procedure(s) (LRB): REMOVAL OF  EXTERNAL HEMORRHOIDS AND EXAM UNDER ANESTHESIA (N/A)  Anesthesia type: general  Patient location: PACU  Post pain: Pain level controlled  Post assessment: Patient's Cardiovascular Status Stable  Last Vitals:  Filed Vitals:   09/08/14 1020  BP: 131/67  Pulse: 79  Temp: 36.8 C  Resp: 15    Post vital signs: Reviewed and stable  Level of consciousness: sedated  Complications: No apparent anesthesia complications

## 2014-09-08 NOTE — Discharge Instructions (Signed)
ANORECTAL SURGERY:  POST OPERATIVE INSTRUCTIONS  1. Take your usually prescribed home medications unless otherwise directed. 2. DIET: Follow a light bland diet the first 24 hours after arrival home, such as soup, liquids, crackers, etc.  Be sure to include lots of fluids daily.  Avoid fast food or heavy meals as your are more likely to get nauseated.  Eat a low fat the next few days after surgery.   3. PAIN CONTROL: a. Pain is best controlled by a usual combination of three different methods TOGETHER: i. Ice/Heat ii. Over the counter pain medication iii. Prescription pain medication b. Most patients will experience some swelling and discomfort in the anus/rectal area. and incisions.  Ice packs or heat (30-60 minutes up to 6 times a day) will help. Use ice for the first few days to help decrease swelling and bruising, then switch to heat such as warm towels, sitz baths, warm baths, etc to help relax tight/sore spots and speed recovery.  Some people prefer to use ice alone, heat alone, alternating between ice & heat.  Experiment to what works for you.  Swelling and bruising can take several weeks to resolve.   c. It is helpful to take an over-the-counter pain medication regularly for the first few weeks.  Choose one of the following that works best for you: i. Naproxen (Aleve, etc)  Two 261m tabs twice a day ii. Ibuprofen (Advil, etc) Three 2015mtabs four times a day (every meal & bedtime) iii. Acetaminophen (Tylenol, etc) 500-65049mour times a day (every meal & bedtime) d. A  prescription for pain medication (such as oxycodone, hydrocodone, etc) should be given to you upon discharge.  Take your pain medication as prescribed.  i. If you are having problems/concerns with the prescription medicine (does not control pain, nausea, vomiting, rash, itching, etc), please call us [phone] see if we need to switch you to a different pain medicine that will work better for you and/or control your  side effect better. ii. If you need a refill on your pain medication, please contact your pharmacy.  They will contact our office to request authorization. Prescriptions will not be filled after 5 pm or on week-ends.  Use a Sitz Bath 4-8 times a day for relief A sitz bath is a warm water bath taken in the sitting position that covers only the hips and buttocks. It may be used for either healing or hygiene purposes. Sitz baths are also used to relieve pain, itching, or muscle spasms. The water may contain medicine. Moist heat will help you heal and relax.  HOME CARE INSTRUCTIONS  Take 3 to 4 sitz baths a day. 1. Fill the bathtub half full with warm water. 2. Sit in the water and open the drain a little. 3. Turn on the warm water to keep the tub half full. Keep the water running constantly. 4. Soak in the water for 15 to 20 minutes. 5. After the sitz bath, pat the affected area dry first. SEEK MEDICAL CARE IF:  You get worse instead of better. Stop the sitz baths if you get worse.   4. KEEP YOUR BOWELS REGULAR a. The goal is one bowel movement a day b. Avoid getting constipated.  Between the surgery and the pain medications, it is common to experience some constipation.  Increasing fluid intake and taking a fiber supplement (such as Metamucil, Citrucel, FiberCon, MiraLax, etc) 1-2 times a day regularly will usually help prevent this problem from occurring.  A  mild laxative (prune juice, Milk of Magnesia, MiraLax, etc) should be taken according to package directions if there are no bowel movements after 48 hours. c. Watch out for diarrhea.  If you have many loose bowel movements, simplify your diet to bland foods & liquids for a few days.  Stop any stool softeners and decrease your fiber supplement.  Switching to mild anti-diarrheal medications (Kayopectate, Pepto Bismol) can help.  If this worsens or does not improve, please call us.  5. Wound Care a. Remove your bandages the day after surgery.   Unless discharge instructions indicate otherwise, leave your bandage dry and in place overnight.  Remove the bandage during your first bowel movement.   b. Allow the wound packing to fall out over the next few days.  You can trim exposed gauze / ribbon as it falls out.  You do not need to repack the wound unless instructed otherwise.  Wear an absorbent pad or soft cotton gauze in your underwear as needed to catch any drainage and help keep the area  c. Keep the area clean and dry.  Bathe / shower every day.  Keep the area clean by showering / bathing over the incision / wound.   It is okay to soak an open wound to help wash it.  Wet wipes or showers / gentle washing after bowel movements is often less traumatic than regular toilet paper. d. Dennis Bast may have some styrofoam-like soft packing in the rectum which will come out with the first bowel movement.  e. You will often notice bleeding with bowel movements.  This should slow down by the end of the first week of surgery f. Expect some drainage.  This should slow down, too, by the end of the first week of surgery.  Wear an absorbent pad or soft cotton gauze in your underwear until the drainage stops. 6. ACTIVITIES as tolerated:   a. You may resume regular (light) daily activities beginning the next day--such as daily self-care, walking, climbing stairs--gradually increasing activities as tolerated.  If you can walk 30 minutes without difficulty, it is safe to try more intense activity such as jogging, treadmill, bicycling, low-impact aerobics, swimming, etc. b. Save the most intensive and strenuous activity for last such as sit-ups, heavy lifting, contact sports, etc  Refrain from any heavy lifting or straining until you are off narcotics for pain control.   c. DO NOT PUSH THROUGH PAIN.  Let pain be your guide: If it hurts to do something, don't do it.  Pain is your body warning you to avoid that activity for another week until the pain goes down. d. You may  drive when you are no longer taking prescription pain medication, you can comfortably sit for long periods of time, and you can safely maneuver your car and apply brakes. e. Dennis Bast may have sexual intercourse when it is comfortable.  7. FOLLOW UP in our office a. Please call [facility] at [phone] to set up an appointment to see your surgeon in the office for a follow-up appointment approximately 2 weeks after your surgery. b. Make sure that you call for this appointment the day you arrive home to insure a convenient appointment time. 10. IF YOU HAVE DISABILITY OR FAMILY LEAVE FORMS, BRING THEM TO THE OFFICE FOR PROCESSING.  DO NOT GIVE THEM TO YOUR DOCTOR.        WHEN TO CALL us [phone]: 1. Poor pain control 2. Reactions / problems with new medications (rash/itching, nausea, etc)  3. Fever over 101.5 F (38.5 C) 4. Inability to urinate 5. Nausea and/or vomiting 6. Worsening swelling or bruising 7. Continued bleeding from incision. 8. Increased pain, redness, or drainage from the incision  The clinic staff is available to answer your questions during regular business hours (8:30am-5pm).  Please dont hesitate to call and ask to speak to one of our nurses for clinical concerns.   A surgeon from [facility] is always on call at the hospitals   If you have a medical emergency, go to the nearest emergency room or call 911.    [facility], [place], [place], [place], [place]  [address] ? MAIN: [phone] ? TOLL FREE: [phone] ? FAX [phone] [email or web address]  HEMORRHOIDS  The rectum is the last foot of your colon, and it naturally stretches to hold stool.  Hemorrhoidal piles are natural clusters of blood vessels that help the rectum and anal canal stretch to hold stool and allow bowel movements to eliminate feces.   Hemorrhoids are abnormally swollen blood vessels in the rectum.  Too much pressure in the rectum  causes hemorrhoids by forcing blood to stretch and bulge the walls of the veins, sometimes even rupturing them.  Hemorrhoids can become like varicose veins you might see on a person's legs.  Most people will develop a flare of hemorrhoids in their lifetime.  When bulging hemorrhoidal veins are irritated, they can swell, burn, itch, cause pain, and bleed.  Most flares will calm down gradually own within a few weeks.  However, once hemorrhoids are created, they are difficult to get rid of completely and tend to flare more easily than the first flare.   Fortunately, good habits and simple medical treatment usually control hemorrhoids well, and surgery is needed only in severe cases. Types of Hemorrhoids:  Internal hemorrhoids usually don't initially hurt or itch; they are deep inside the rectum and usually have no sensation. If they begin to push out (prolapse), pain and burning can occur.  However, internal hemorrhoids can bleed.  Anal bleeding should not be ignored since bleeding could come from a dangerous source like colorectal cancer, so persistent rectal bleeding should be investigated by a doctor, sometimes with a colonoscopy.  External hemorrhoids cause most of the symptoms - pain, burning, and itching. Nonirritated hemorrhoids can look like small skin tags coming out of the anus.   Thrombosed hemorrhoids can form when a hemorrhoid blood vessel bursts and causes the hemorrhoid to suddenly swell.  A purple blood clot can form in it and become an excruciatingly painful lump at the anus. Because of these unpleasant symptoms, immediate incision and drainage by a surgeon at an office visit can provide much relief of the pain.    PREVENTION Avoiding the most frequent causes listed below will prevent most cases of hemorrhoids: Constipation Hard stools Diarrhea  Constant sitting  Straining with bowel movements Sitting on the toilet for a long time  Severe coughing  episodes Pregnancy / Childbirth    Heavy Lifting  Sometimes avoiding the above triggers is difficult:  How can you avoid sitting all day if you have a seated job? Also, we try to avoid coughing and diarrhea, but sometimes its beyond your control.  Still, there are some practical hints to help: Keep the anal and genital area clean.  Moistened tissues such as flushable wet wipes are less irritating than toilet paper.  Using irrigating showers or bottle irrigation washing gently cleans this sensitive area.  Avoid dry toilet paper when cleaning after bowel movements.  [name] Keep the anal and genital area dry.  Lightly pat the rectal area dry.  Avoid rubbing.  Talcum or baby powders can help GET YOUR STOOLS SOFT.   This is the most important way to prevent irritated hemorrhoids.  Hard stools are like sandpaper to the anorectal canal and will cause more problems.  The goal: ONE SOFT BOWEL MOVEMENT A DAY!  BMs from every other day to 3 times a day is a tolerable range Treat coughing, diarrhea and constipation early since irritated hemorrhoids may soon follow.  If your main job activity is seated, always stand or walk during your breaks. Make it a point to stand and walk at least 5 minutes every hour and try to shift frequently in your chair to avoid direct rectal pressure.  Always exhale as you strain or lift. Don't hold your breath.  Do not delay or try to prevent a bowel movement when the urge is present. Exercise regularly (walking or jogging 60 minutes a day) to stimulate the bowels to move. No reading or other activity while on the toilet. If bowel movements take longer than 5 minutes, you are too constipated. AVOID CONSTIPATION Drink plenty of liquids (1 1/2 to 2 quarts of water and other fluids a day unless fluid restricted for another medical condition). Liquids that contain caffeine (coffee a, tea, soft drinks) can be dehydrating and should be avoided until constipation is controlled. Consider minimizing milk, as dairy products may be  constipating. Eat plenty of fiber (30g a day ideal, more if needed).  Fiber is the undigested part of plant food that passes into the colon, acting as natures broom to encourage bowel motility and movement.  Fiber can absorb and hold large amounts of water. This results in a larger, bulkier stool, which is soft and easier to pass.  Eating foods high in fiber - 12 servings - such as  Vegetables: Root (potatoes, carrots, turnips), Leafy green (lettuce, salad greens, celery, spinach), High residue (cabbage, broccoli, etc.) Fruit: Fresh, Dried (prunes, apricots, cherries), Stewed (applesauce)  Whole grain breads, pasta, whole wheat Bran cereals, muffins, etc. Consider adding supplemental bulking fiber which retains large volumes of water: Psyllium ground seeds (native plant from [place])--available as Metamucil, Konsyl, Effersyllium, Per Diem Fiber, or the less expensive generic forms.  Citrucel  (methylcellulose wood fiber) . FiberCon (Polycarbophil) Polyethylene Glycol - and artificial fiber commonly called Miralax or Glycolax.  It is helpful for people with gassy or bloated feelings with regular fiber Flax Seed - a less gassy natural fiber  Laxatives can be useful for a short period if constipation is severe Osmotics (Milk of Magnesia, Fleets Phospho-Soda, Magnesium Citrate)  Stimulants (Senokot,   Castor Oil,  Dulcolax, Ex-Lax)    Laxatives are not a good long-term solution as it can stress the bowels and cause too much mineral loss and dehydration.   Avoid taking laxatives for more than 7 days in a row.  AVOID DIARRHEA Switch to liquids and simpler foods for a few days to avoid stressing your intestines further. Avoid dairy products (especially milk & ice cream) for a short time.  The intestines often can lose the ability to digest lactose when stressed. Avoid foods that cause gassiness or bloating.  Typical foods include beans and other legumes, cabbage, broccoli, and dairy foods.   Every person has some sensitivity to other foods, so listen to your body and avoid those foods that trigger problems for  you. Adding fiber (Citrucel, Metamucil, FiberCon, Flax seed, Miralax) gradually can help thicken stools by absorbing excess fluid and retrain the intestines to act more normally.  Slowly increase the dose over a few weeks.  Too much fiber too soon can backfire and cause cramping & bloating. Probiotics (such as active yogurt, Align, etc) may help repopulate the intestines and colon with normal bacteria and calm down a sensitive digestive tract.  Most studies show it to be of mild help, though, and such products can be costly. Medicines: Bismuth subsalicylate (ex. Kayopectate, Pepto Bismol) every 30 minutes for up to 6 doses can help control diarrhea.  Avoid if pregnant. Loperamide (Immodium) can slow down diarrhea.  Start with two tablets (4mg  total) first and then try one tablet every 6 hours.  Avoid if you are having fevers or severe pain.  If you are not better or start feeling worse, stop all medicines and call your doctor for advice Call your doctor if you are getting worse or not better.  Sometimes further testing (cultures, endoscopy, X-ray studies, bloodwork, etc) may be needed to help diagnose and treat the cause of the diarrhea. TREATMENT OF HEMORRHOID FLARE If these preventive measures fail, you must take action right away! Hemorrhoids are one condition that can be mild in the morning and become intolerable by nightfall. Most hemorrhoidal flares take several weeks to calm down.  These suggestions can help: Warm soaks.  This helps more than any topical medication.  Use up to 8 times a day.  Usually sitz baths or sitting in a warm bathtub helps.  Sitting on moist warm towels are helpful.  Switching to ice packs/cool compresses can be helpful  Use a Sitz Bath 4-8 times a day for relief A sitz bath is a warm water bath taken in the sitting position that covers only the hips and  buttocks. It may be used for either healing or hygiene purposes. Sitz baths are also used to relieve pain, itching, or muscle spasms. The water may contain medicine. Moist heat will help you heal and relax.  HOME CARE INSTRUCTIONS  Take 3 to 4 sitz baths a day. 6. Fill the bathtub half full with warm water. 7. Sit in the water and open the drain a little. 8. Turn on the warm water to keep the tub half full. Keep the water running constantly. 9. Soak in the water for 15 to 20 minutes. 10. After the sitz bath, pat the affected area dry first. SEEK MEDICAL CARE IF:  You get worse instead of better. Stop the sitz baths if you get worse.  Normalize your bowels.  Extremes of diarrhea or constipation will make hemorrhoids worse.  One soft bowel movement a day is the goal.  Fiber can help get your bowels regular Wet wipes instead of toilet paper Pain control with a NSAID such as ibuprofen (Advil) or naproxen (Aleve) or acetaminophen (Tylenol) around the clock.  Narcotics are constipating and should be minimized if possible Topical creams contain steroids (bydrocortisone) or local anesthetic (xylocaine) can help make pain and itching more tolerable.   EVALUATION If hemorrhoids are still causing problems, you could benefit by an evaluation by a surgeon.  The surgeon will obtain a history and examine you.  If hemorrhoids are diagnosed, some therapies can be offered in the office, usually with an anoscope into the less sensitive area of the rectum: -injection of hemorrhoids (sclerotherapy) can scar the blood vessels of the swollen/enlarged hemorrhoids to help shrink them down to  a more normal size -rubber banding of the enlarged hemorrhoids to help shrink them down to a more normal size -drainage of the blood clot causing a thrombosed hemorrhoid,  to relieve the severe pain   While 90% of the time such problems from hemorrhoids can be managed without preceding to surgery, sometimes the hemorrhoids require a  operation to control the problem (uncontrolled bleeding, prolapse, pain, etc.).   This involves being placed under general anesthesia where the surgeon can confirm the diagnosis and remove, suture, or staple the hemorrhoid(s).  Your surgeon can help you treat the problem appropriately.    GETTING TO GOOD BOWEL HEALTH. Irregular bowel habits such as constipation and diarrhea can lead to many problems over time.  Having one soft bowel movement a day is the most important way to prevent further problems.  The anorectal canal is designed to handle stretching and feces to safely manage our ability to get rid of solid waste (feces, poop, stool) out of our body.  BUT, hard constipated stools can act like ripping concrete bricks and diarrhea can be a burning fire to this very sensitive area of our body, causing inflamed hemorrhoids, anal fissures, increasing risk is perirectal abscesses, abdominal pain/bloating, an making irritable bowel worse.     The goal: ONE SOFT BOWEL MOVEMENT A DAY!  To have soft, regular bowel movements:   Drink at least 8 tall glasses of water a day.    Take plenty of fiber.  Fiber is the undigested part of plant food that passes into the colon, acting s natures broom to encourage bowel motility and movement.  Fiber can absorb and hold large amounts of water. This results in a larger, bulkier stool, which is soft and easier to pass. Work gradually over several weeks up to 6 servings a day of fiber (25g a day even more if needed) in the form of: o Vegetables -- Root (potatoes, carrots, turnips), leafy green (lettuce, salad greens, celery, spinach), or cooked high residue (cabbage, broccoli, etc) o Fruit -- Fresh (unpeeled skin & pulp), Dried (prunes, apricots, cherries, etc ),  or stewed ( applesauce)  o Whole grain breads, pasta, etc (whole wheat)  o Bran cereals   Bulking Agents -- This type of water-retaining fiber generally is easily obtained each day by one of the following:   o Psyllium bran -- The psyllium plant is remarkable because its ground seeds can retain so much water. This product is available as Metamucil, Konsyl, Effersyllium, Per Diem Fiber, or the less expensive generic preparation in drug and health food stores. Although labeled a laxative, it really is not a laxative.  o Methylcellulose -- This is another fiber derived from wood which also retains water. It is available as Citrucel. o Polyethylene Glycol - and artificial fiber commonly called Miralax or Glycolax.  It is helpful for people with gassy or bloated feelings with regular fiber o Flax Seed - a less gassy fiber than psyllium  No reading or other relaxing activity while on the toilet. If bowel movements take longer than 5 minutes, you are too constipated  AVOID CONSTIPATION.  High fiber and water intake usually takes care of this.  Sometimes a laxative is needed to stimulate more frequent bowel movements, but   Laxatives are not a good long-term solution as it can wear the colon out. o Osmotics (Milk of Magnesia, Fleets phosphosoda, Magnesium citrate, MiraLax, GoLytely) are safer than  o Stimulants (Senokot, Castor Oil, Dulcolax, Ex Lax)    o  Do not take laxatives for more than 7days in a row.   IF SEVERELY CONSTIPATED, try a Bowel Retraining Program: o Do not use laxatives.  o Eat a diet high in roughage, such as bran cereals and leafy vegetables.  o Drink six (6) ounces of prune or apricot juice each morning.  o Eat two (2) large servings of stewed fruit each day.  o Take one (1) heaping tablespoon of a psyllium-based bulking agent twice a day. Use sugar-free sweetener when possible to avoid excessive calories.  o Eat a normal breakfast.  o Set aside 15 minutes after breakfast to sit on the toilet, but do not strain to have a bowel movement.  o If you do not have a bowel movement by the third day, use an enema and repeat the above steps.   Controlling diarrhea o Switch to liquids and  simpler foods for a few days to avoid stressing your intestines further. o Avoid dairy products (especially milk & ice cream) for a short time.  The intestines often can lose the ability to digest lactose when stressed. o Avoid foods that cause gassiness or bloating.  Typical foods include beans and other legumes, cabbage, broccoli, and dairy foods.  Every person has some sensitivity to other foods, so listen to our body and avoid those foods that trigger problems for you. o Adding fiber (Citrucel, Metamucil, psyllium, Miralax) gradually can help thicken stools by absorbing excess fluid and retrain the intestines to act more normally.  Slowly increase the dose over a few weeks.  Too much fiber too soon can backfire and cause cramping & bloating. o Probiotics (such as active yogurt, Align, etc) may help repopulate the intestines and colon with normal bacteria and calm down a sensitive digestive tract.  Most studies show it to be of mild help, though, and such products can be costly. o Medicines: - Bismuth subsalicylate (ex. Kayopectate, Pepto Bismol) every 30 minutes for up to 6 doses can help control diarrhea.  Avoid if pregnant. - Loperamide (Immodium) can slow down diarrhea.  Start with two tablets (4mg  total) first and then try one tablet every 6 hours.  Avoid if you are having fevers or severe pain.  If you are not better or start feeling worse, stop all medicines and call your doctor for advice o Call your doctor if you are getting worse or not better.  Sometimes further testing (cultures, endoscopy, X-ray studies, bloodwork, etc) may be needed to help diagnose and treat the cause of the diarrhea.  Managing Pain  Pain after surgery or related to activity is often due to strain/injury to muscle, tendon, nerves and/or incisions.  This pain is usually short-term and will improve in a few months.   Many people find it helpful to do the following things TOGETHER to help speed the process of healing and  to get back to regular activity more quickly:  1. Avoid heavy physical activity a.  no lifting greater than 20 pounds b. Do not push through the pain.  Listen to your body and avoid positions and maneuvers than reproduce the pain c. Walking is okay as tolerated, but go slowly and stop when getting sore.  d. Remember: If it hurts to do it, then dont do it! 2. Take Anti-inflammatory medication  a. Take with food/snack around the clock for 1-2 weeks i. This helps the muscle and nerve tissues become less irritable and calm down faster b. Choose ONE of the following over-the-counter medications: i. Naproxen 220mg   tabs (ex. Aleve) 1-2 pills twice a day  ii. Ibuprofen 200mg  tabs (ex. Advil, Motrin) 3-4 pills with every meal and just before bedtime iii. Acetaminophen 500mg  tabs (Tylenol) 1-2 pills with every meal and just before bedtime 3. Use a Heating pad or Ice/Cold Pack a. 4-6 times a day b. May use warm bath/hottub  or showers 4. Try Gentle Massage and/or Stretching  a. at the area of pain many times a day b. stop if you feel pain - do not overdo it  Try these steps together to help you body heal faster and avoid making things get worse.  Doing just one of these things may not be enough.    If you are not getting better after two weeks or are noticing you are getting worse, contact our office for further advice; we may need to re-evaluate you & see what other things we can do to help.

## 2014-09-08 NOTE — Anesthesia Procedure Notes (Signed)
Procedure Name: Intubation Date/Time: 09/08/2014 7:41 AM Performed by: Yakelin Grenier, Virgel Gess Pre-anesthesia Checklist: Patient identified, Emergency Drugs available, Suction available, Patient being monitored and Timeout performed Patient Re-evaluated:Patient Re-evaluated prior to inductionOxygen Delivery Method: Circle system utilized Preoxygenation: Pre-oxygenation with 100% oxygen Intubation Type: IV induction Ventilation: Mask ventilation without difficulty Laryngoscope Size: Mac and 4 Grade View: Grade I Tube type: Oral Tube size: 7.5 mm Number of attempts: 1 Airway Equipment and Method: Stylet Placement Confirmation: ETT inserted through vocal cords under direct vision,  positive ETCO2,  CO2 detector and breath sounds checked- equal and bilateral Secured at: 21 cm Tube secured with: Tape Dental Injury: Teeth and Oropharynx as per pre-operative assessment
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
## 2014-09-08 NOTE — Interval H&P Note (Signed)
History and Physical Interval Note:  09/08/2014 7:22 AM  Carrie [name]  has presented today for surgery, with the diagnosis of Prolapsing Hemorrhoids  The various methods of treatment have been discussed with the patient and family. After consideration of risks, benefits and other options for treatment, the patient has consented to  Procedure(s): REMOVAL OF INTERANAL AND EXTERNAL HEMORRHOIDS AND EXAM UNDER ANESTHESIA (N/A) as a surgical intervention .  The patient's history has been reviewed, patient examined, no change in status, stable for surgery.  I have reviewed the patient's chart and labs.  Questions were answered to the patient's satisfaction.     Cinzia Devos C.

## 2014-09-08 NOTE — Transfer of Care (Signed)
Immediate Anesthesia Transfer of Care Note  Patient: Carrie [name]  Procedure(s) Performed: Procedure(s) (LRB): REMOVAL OF  EXTERNAL HEMORRHOIDS AND EXAM UNDER ANESTHESIA (N/A)  Patient Location: PACU  Anesthesia Type: General  Level of Consciousness: sedated, patient cooperative and responds to stimulation  Airway & Oxygen Therapy: Patient Spontanous Breathing and Patient connected to face mask oxgen  Post-op Assessment: Report given to PACU RN and Post -op Vital signs reviewed and stable  Post vital signs: Reviewed and stable  Complications: No apparent anesthesia complications

## 2014-09-08 NOTE — Anesthesia Preprocedure Evaluation (Addendum)
Anesthesia Evaluation  Patient identified by MRN, date of birth, ID band Patient awake    Reviewed: Allergy & Precautions, NPO status , Patient's Chart, lab work & pertinent test results  History of Anesthesia Complications (+) PONV  Airway Mallampati: II  TM Distance: >3 FB Neck ROM: Full    Dental  (+) Teeth Intact   Pulmonary COPD COPD inhaler and oxygen dependent,          Cardiovascular Exercise Tolerance: Poor + CAD, + Cardiac Stents and + Peripheral Vascular Disease     Neuro/Psych  Headaches,    GI/Hepatic hiatal hernia, GERD-  Medicated and Controlled,  Endo/Other    Renal/GU      Musculoskeletal  (+) Arthritis -,   Abdominal   Peds  Hematology   Anesthesia Other Findings   Reproductive/Obstetrics                            Anesthesia Physical Anesthesia Plan  ASA: III  Anesthesia Plan: General   Post-op Pain Management:    Induction: Intravenous  Airway Management Planned: Oral ETT  Additional Equipment:   Intra-op Plan:   Post-operative Plan: Extubation in OR  Informed Consent: I have reviewed the patients History and Physical, chart, labs and discussed the procedure including the risks, benefits and alternatives for the proposed anesthesia with the patient or authorized representative who has indicated his/her understanding and acceptance.   Dental advisory given  Plan Discussed with: CRNA and Surgeon  Anesthesia Plan Comments:        Anesthesia Quick Evaluation

## 2014-09-08 NOTE — Interval H&P Note (Signed)
History and Physical Interval Note:  09/08/2014 7:22 AM  Carrie [name]  has presented today for surgery, with the diagnosis of Prolapsing Hemorrhoids  The various methods of treatment have been discussed with the patient and family. After consideration of risks, benefits and other options for treatment, the patient has consented to  Procedure(s): REMOVAL OF INTERANAL AND EXTERNAL HEMORRHOIDS AND EXAM UNDER ANESTHESIA (N/A) as a surgical intervention .  The patient's history has been reviewed, patient examined, no change in status, stable for surgery.  I have reviewed the patient's chart and labs.  Questions were answered to the patient's satisfaction.     [name]

## 2014-09-09 ENCOUNTER — Encounter (HOSPITAL_COMMUNITY): Payer: Self-pay | Admitting: Surgery

## 2014-09-27 DIAGNOSIS — J449 Chronic obstructive pulmonary disease, unspecified: Secondary | ICD-10-CM | POA: Diagnosis not present

## 2014-09-27 DIAGNOSIS — R1310 Dysphagia, unspecified: Secondary | ICD-10-CM | POA: Diagnosis not present

## 2014-10-08 DIAGNOSIS — Z5181 Encounter for therapeutic drug level monitoring: Secondary | ICD-10-CM | POA: Diagnosis not present

## 2014-10-08 DIAGNOSIS — J45909 Unspecified asthma, uncomplicated: Secondary | ICD-10-CM | POA: Diagnosis not present

## 2014-10-08 DIAGNOSIS — I2699 Other pulmonary embolism without acute cor pulmonale: Secondary | ICD-10-CM | POA: Diagnosis not present

## 2014-10-08 DIAGNOSIS — Z7901 Long term (current) use of anticoagulants: Secondary | ICD-10-CM | POA: Diagnosis not present

## 2014-10-08 DIAGNOSIS — I2510 Atherosclerotic heart disease of native coronary artery without angina pectoris: Secondary | ICD-10-CM | POA: Diagnosis not present

## 2014-10-08 DIAGNOSIS — Z951 Presence of aortocoronary bypass graft: Secondary | ICD-10-CM | POA: Diagnosis not present

## 2014-10-10 DIAGNOSIS — Z5181 Encounter for therapeutic drug level monitoring: Secondary | ICD-10-CM | POA: Diagnosis not present

## 2014-10-10 DIAGNOSIS — I2699 Other pulmonary embolism without acute cor pulmonale: Secondary | ICD-10-CM | POA: Diagnosis not present

## 2014-10-10 DIAGNOSIS — J4550 Severe persistent asthma, uncomplicated: Secondary | ICD-10-CM | POA: Diagnosis not present

## 2014-10-10 DIAGNOSIS — Z7901 Long term (current) use of anticoagulants: Secondary | ICD-10-CM | POA: Diagnosis not present

## 2014-10-10 DIAGNOSIS — Z86718 Personal history of other venous thrombosis and embolism: Secondary | ICD-10-CM | POA: Diagnosis not present

## 2014-10-10 DIAGNOSIS — I2510 Atherosclerotic heart disease of native coronary artery without angina pectoris: Secondary | ICD-10-CM | POA: Diagnosis not present

## 2014-10-15 DIAGNOSIS — Z8679 Personal history of other diseases of the circulatory system: Secondary | ICD-10-CM | POA: Diagnosis not present

## 2014-10-15 DIAGNOSIS — Z7901 Long term (current) use of anticoagulants: Secondary | ICD-10-CM | POA: Diagnosis not present

## 2014-10-15 DIAGNOSIS — I2699 Other pulmonary embolism without acute cor pulmonale: Secondary | ICD-10-CM | POA: Diagnosis not present

## 2014-10-15 DIAGNOSIS — Z8719 Personal history of other diseases of the digestive system: Secondary | ICD-10-CM | POA: Diagnosis not present

## 2014-10-15 DIAGNOSIS — Z8709 Personal history of other diseases of the respiratory system: Secondary | ICD-10-CM | POA: Diagnosis not present

## 2014-10-22 DIAGNOSIS — Z955 Presence of coronary angioplasty implant and graft: Secondary | ICD-10-CM | POA: Diagnosis not present

## 2014-10-22 DIAGNOSIS — Z7901 Long term (current) use of anticoagulants: Secondary | ICD-10-CM | POA: Diagnosis not present

## 2014-10-22 DIAGNOSIS — I2699 Other pulmonary embolism without acute cor pulmonale: Secondary | ICD-10-CM | POA: Diagnosis not present

## 2014-10-22 DIAGNOSIS — I2510 Atherosclerotic heart disease of native coronary artery without angina pectoris: Secondary | ICD-10-CM | POA: Diagnosis not present

## 2014-10-27 DIAGNOSIS — J449 Chronic obstructive pulmonary disease, unspecified: Secondary | ICD-10-CM | POA: Diagnosis not present

## 2014-10-27 DIAGNOSIS — R1310 Dysphagia, unspecified: Secondary | ICD-10-CM | POA: Diagnosis not present

## 2014-11-07 DIAGNOSIS — Z5181 Encounter for therapeutic drug level monitoring: Secondary | ICD-10-CM | POA: Diagnosis not present

## 2014-11-07 DIAGNOSIS — I2699 Other pulmonary embolism without acute cor pulmonale: Secondary | ICD-10-CM | POA: Diagnosis not present

## 2014-11-07 DIAGNOSIS — Z7901 Long term (current) use of anticoagulants: Secondary | ICD-10-CM | POA: Diagnosis not present

## 2014-11-11 ENCOUNTER — Other Ambulatory Visit: Payer: Self-pay | Admitting: Surgery

## 2014-11-12 DIAGNOSIS — R350 Frequency of micturition: Secondary | ICD-10-CM | POA: Diagnosis not present

## 2014-11-12 DIAGNOSIS — I2699 Other pulmonary embolism without acute cor pulmonale: Secondary | ICD-10-CM | POA: Diagnosis not present

## 2014-11-12 DIAGNOSIS — F0390 Unspecified dementia without behavioral disturbance: Secondary | ICD-10-CM | POA: Diagnosis not present

## 2014-11-12 DIAGNOSIS — Z79899 Other long term (current) drug therapy: Secondary | ICD-10-CM | POA: Diagnosis not present

## 2014-11-12 DIAGNOSIS — J4550 Severe persistent asthma, uncomplicated: Secondary | ICD-10-CM | POA: Diagnosis not present

## 2014-11-12 DIAGNOSIS — K219 Gastro-esophageal reflux disease without esophagitis: Secondary | ICD-10-CM | POA: Diagnosis not present

## 2014-11-12 DIAGNOSIS — Z7901 Long term (current) use of anticoagulants: Secondary | ICD-10-CM | POA: Diagnosis not present

## 2014-11-12 DIAGNOSIS — E780 Pure hypercholesterolemia: Secondary | ICD-10-CM | POA: Diagnosis not present

## 2014-11-12 DIAGNOSIS — R32 Unspecified urinary incontinence: Secondary | ICD-10-CM | POA: Diagnosis not present

## 2014-11-12 DIAGNOSIS — I2510 Atherosclerotic heart disease of native coronary artery without angina pectoris: Secondary | ICD-10-CM | POA: Diagnosis not present

## 2014-12-15 ENCOUNTER — Other Ambulatory Visit: Payer: Self-pay

## 2015-01-26 ENCOUNTER — Other Ambulatory Visit (HOSPITAL_COMMUNITY): Payer: Self-pay | Admitting: Orthopedic Surgery

## 2015-01-26 ENCOUNTER — Ambulatory Visit (HOSPITAL_COMMUNITY)
Admission: RE | Admit: 2015-01-26 | Discharge: 2015-01-26 | Disposition: A | Discharge status: Home / Self Care | Payer: Medicare Other | Source: Ambulatory Visit | Attending: Cardiovascular Disease | Admitting: Cardiovascular Disease

## 2015-01-26 DIAGNOSIS — M7989 Other specified soft tissue disorders: Principal | ICD-10-CM

## 2015-01-26 DIAGNOSIS — M79662 Pain in left lower leg: Secondary | ICD-10-CM

## 2015-01-26 DIAGNOSIS — M79605 Pain in left leg: Secondary | ICD-10-CM | POA: Diagnosis not present

## 2015-02-17 ENCOUNTER — Other Ambulatory Visit: Payer: Self-pay | Admitting: Orthopedic Surgery

## 2015-02-17 DIAGNOSIS — M5416 Radiculopathy, lumbar region: Secondary | ICD-10-CM

## 2015-02-28 ENCOUNTER — Other Ambulatory Visit: Payer: Medicare Other

## 2015-03-23 ENCOUNTER — Ambulatory Visit
Admission: RE | Admit: 2015-03-23 | Discharge: 2015-03-23 | Disposition: A | Discharge status: Home / Self Care | Payer: Medicare Other | Source: Ambulatory Visit | Attending: Orthopedic Surgery | Admitting: Orthopedic Surgery

## 2015-03-23 DIAGNOSIS — M5416 Radiculopathy, lumbar region: Secondary | ICD-10-CM

## 2015-10-22 ENCOUNTER — Encounter: Payer: Self-pay | Admitting: *Deleted

## 2015-10-22 ENCOUNTER — Encounter: Payer: Self-pay | Admitting: Gastroenterology

## 2015-10-27 ENCOUNTER — Ambulatory Visit: Payer: Medicare Other | Admitting: Gastroenterology

## 2016-08-05 IMAGING — MR MR LUMBAR SPINE W/O CM
4 of 5 series · 26 of 48 positions shown · non-contrast
Comparison: Radiography 05/28/2014

CLINICAL DATA: Left lower extremity pain and swelling, 2 months
duration.

EXAM:
MRI LUMBAR SPINE WITHOUT CONTRAST
TECHNIQUE: Multiplanar, multisequence MR imaging of the lumbar spine was
performed. No intravenous contrast was administered.

[Series 3: T2 · sagittal · 4.0mm · 0.55mm/px · 6 of 13 slices shown (1 of 2)]
[im 1/13]
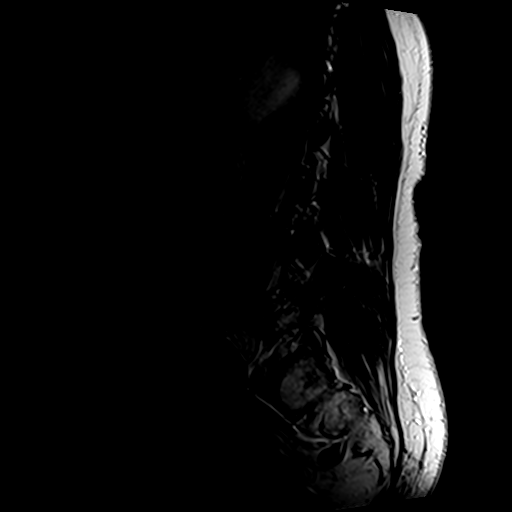
[im 3/13]
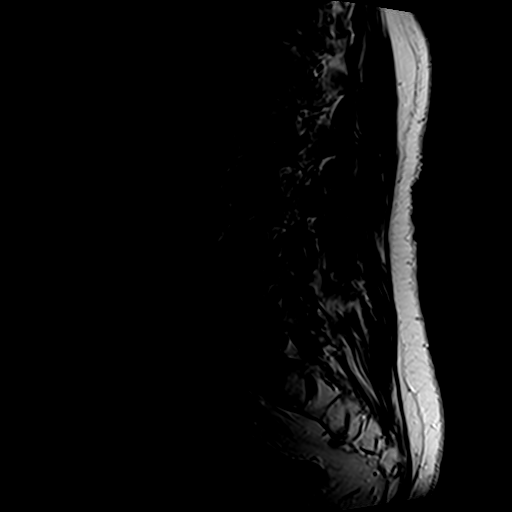
[im 5/13]
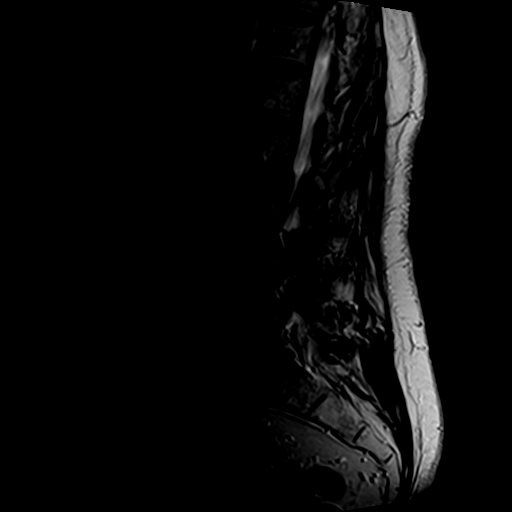
[im 8/13]
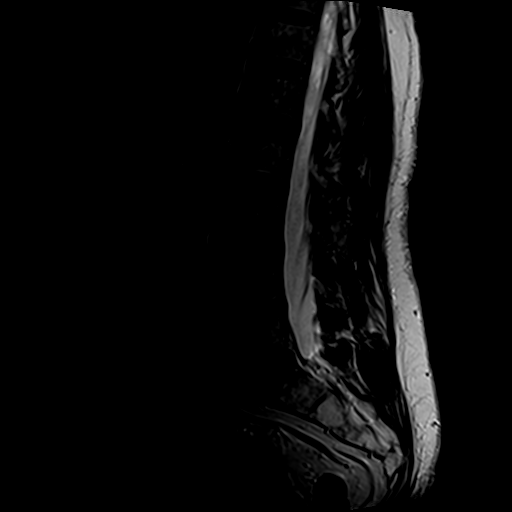
[im 10/13]
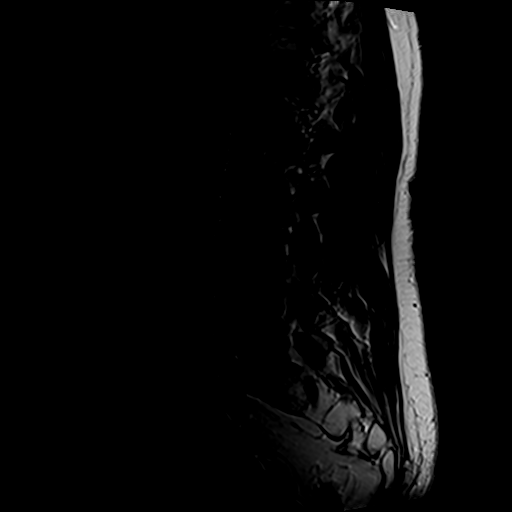
[im 13/13]
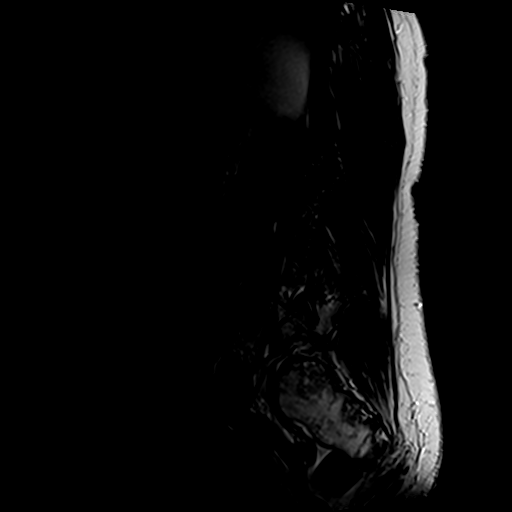

[Series 5: T1 · sagittal · 4.0mm · 0.55mm/px · 6 of 13 slices shown (1 of 2)]
[im 1/13]
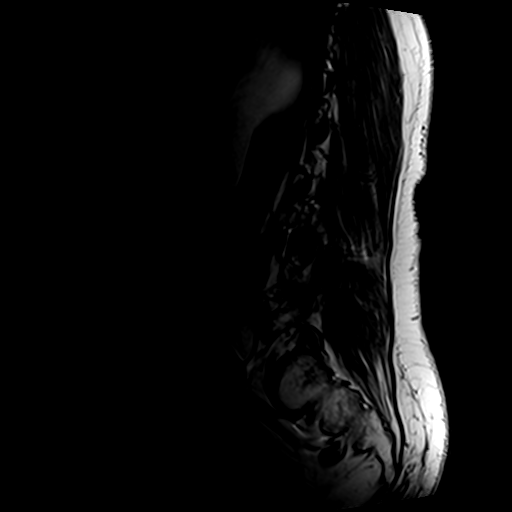
[im 3/13]
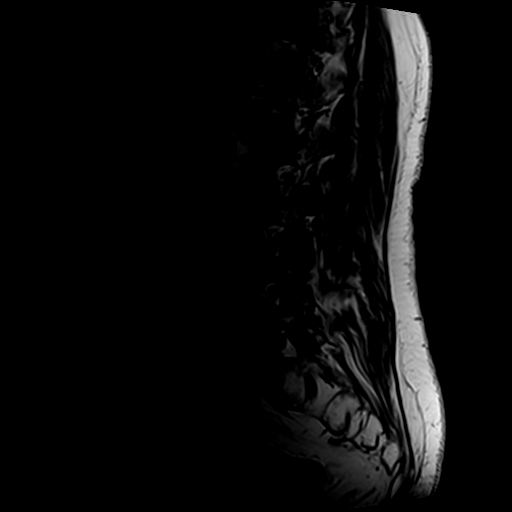
[im 5/13]
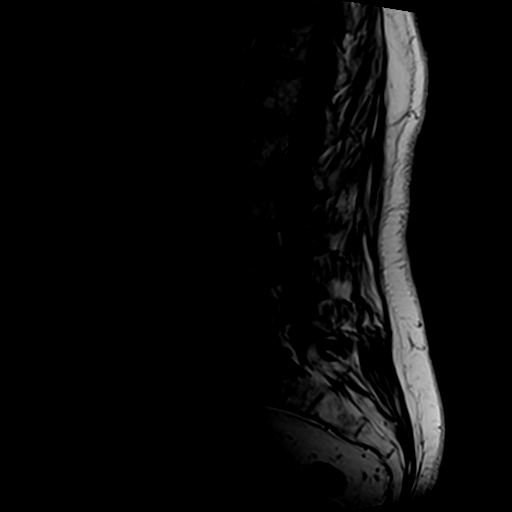
[im 8/13]
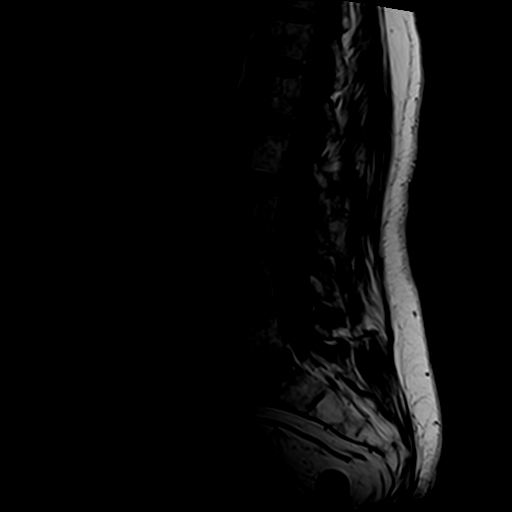
[im 10/13]
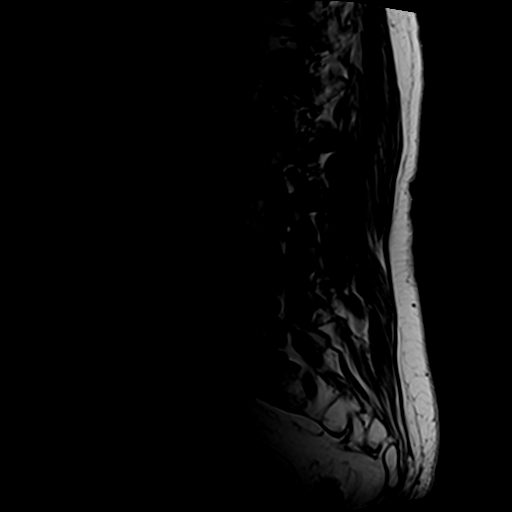
[im 13/13]
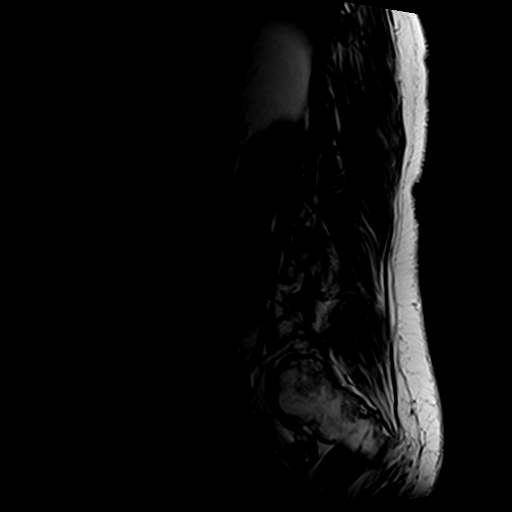

[Series 6: T2 · axial · 4.0mm · 0.70mm/px · z∈[-60,+107]mm · 9 of 32 slices shown (2 of 2)]
[im 1/32]
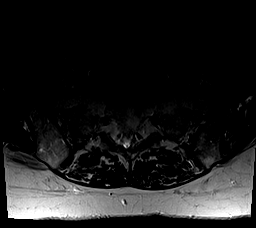
[im 5/32]
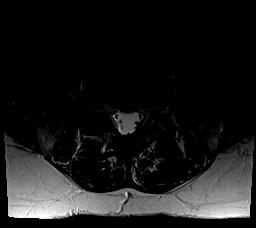
[im 9/32]
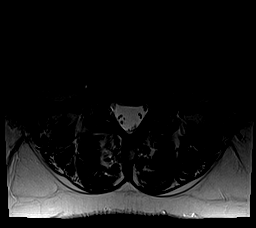
[im 14/32]
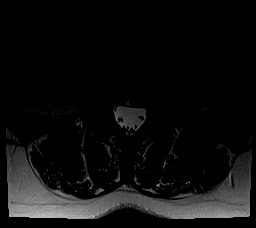
[im 16/32]
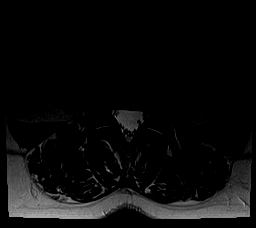
[im 18/32]
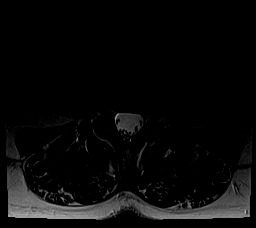
[im 23/32]
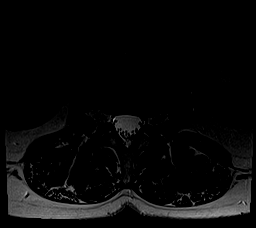
[im 27/32]
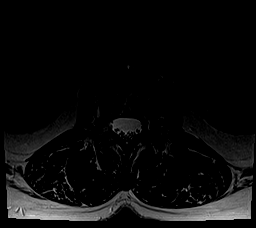
[im 32/32]
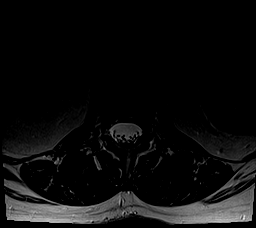

[Series 7: T1 · axial · 4.0mm · 0.35mm/px · z∈[-60,+82]mm · 5 of 32 slices shown (2 of 2)]
[im 1/32]
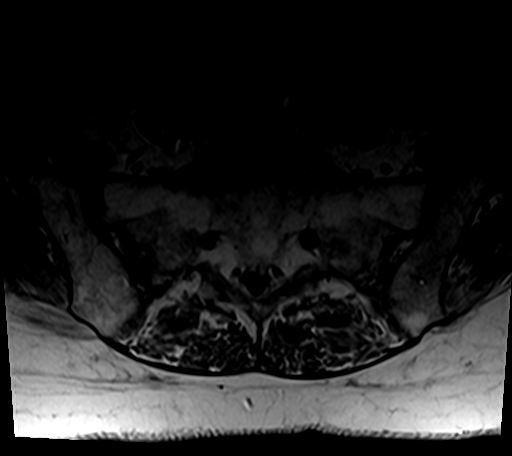
[im 5/32]
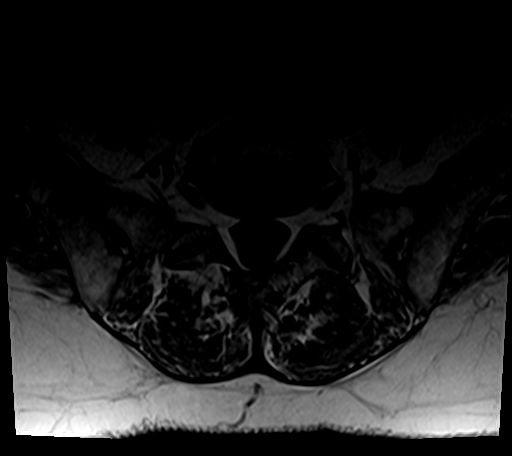
[im 9/32]
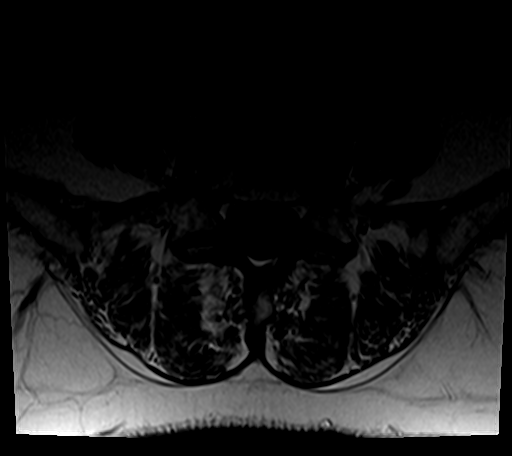
[im 16/32]
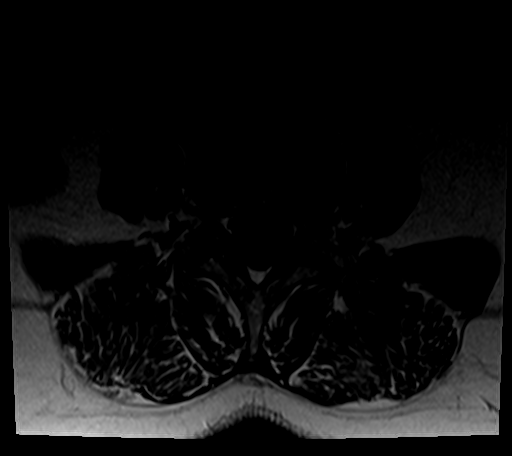
[im 27/32]
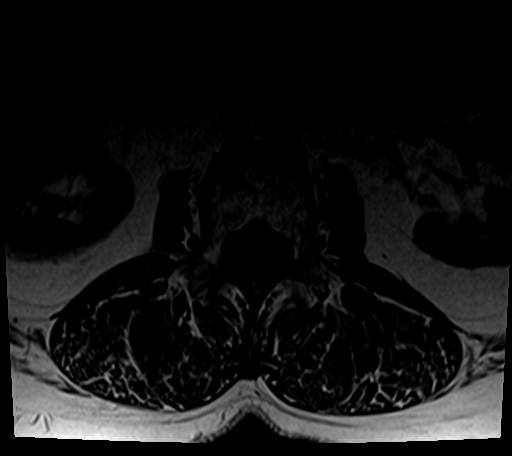

[26 of 48 positions shown; findings below may reference images not displayed]

FINDINGS: Alignment is normal. There are no significant degenerative changes
in the lumbar region. There are minimal, non-compressive disc bulges
at L3-4 and L4-5 and there is mild facet osteoarthritis at L3-4 at
L4-5. These findings could possibly be associated with low back pain
but there is no compressive stenosis or evidence of focal neural
compression to explain left lower extremity symptoms.
IMPRESSION: Mild, likely insignificant degenerative changes in the lumbar
region. No cause of the left lower extremity symptoms is identified.

## 2018-11-13 ENCOUNTER — Encounter: Payer: Self-pay | Admitting: Gastroenterology
